# Patient Record
Sex: Male | Born: 1935
Health system: Southern US, Community
[De-identification: ages and names within clinical notes are randomized; demographics above are authoritative.]

## PROBLEM LIST (undated history)

## (undated) DIAGNOSIS — Z86010 Personal history of colonic polyps: Secondary | ICD-10-CM

## (undated) DIAGNOSIS — R413 Other amnesia: Secondary | ICD-10-CM

## (undated) DIAGNOSIS — Z79899 Other long term (current) drug therapy: Secondary | ICD-10-CM

## (undated) DIAGNOSIS — E7800 Pure hypercholesterolemia, unspecified: Secondary | ICD-10-CM

## (undated) DIAGNOSIS — M19019 Primary osteoarthritis, unspecified shoulder: Secondary | ICD-10-CM

## (undated) DIAGNOSIS — N183 Chronic kidney disease, stage 3 unspecified: Secondary | ICD-10-CM

## (undated) DIAGNOSIS — Z77090 Contact with and (suspected) exposure to asbestos: Secondary | ICD-10-CM

## (undated) DIAGNOSIS — I951 Orthostatic hypotension: Secondary | ICD-10-CM

## (undated) DIAGNOSIS — I129 Hypertensive chronic kidney disease with stage 1 through stage 4 chronic kidney disease, or unspecified chronic kidney disease: Secondary | ICD-10-CM

## (undated) DIAGNOSIS — C189 Malignant neoplasm of colon, unspecified: Secondary | ICD-10-CM

## (undated) DIAGNOSIS — I2510 Atherosclerotic heart disease of native coronary artery without angina pectoris: Secondary | ICD-10-CM

## (undated) DIAGNOSIS — K5730 Diverticulosis of large intestine without perforation or abscess without bleeding: Secondary | ICD-10-CM

## (undated) DIAGNOSIS — F419 Anxiety disorder, unspecified: Secondary | ICD-10-CM

## (undated) DIAGNOSIS — K5190 Ulcerative colitis, unspecified, without complications: Secondary | ICD-10-CM

## (undated) DIAGNOSIS — K222 Esophageal obstruction: Secondary | ICD-10-CM

## (undated) HISTORY — PX: CORONARY ARTERY BYPASS GRAFT: SHX141

## (undated) HISTORY — DX: Personal history of colonic polyps: Z86.010

## (undated) HISTORY — DX: Anxiety disorder, unspecified: F41.9

## (undated) HISTORY — DX: Primary osteoarthritis, unspecified shoulder: M19.019

## (undated) HISTORY — DX: Esophageal obstruction: K22.2

## (undated) HISTORY — PX: OTHER SURGICAL HISTORY: SHX169

## (undated) HISTORY — DX: Chronic kidney disease, stage 3 (moderate): N18.3

## (undated) HISTORY — DX: Hypertensive chronic kidney disease with stage 1 through stage 4 chronic kidney disease, or unspecified chronic kidney disease: I12.9

## (undated) HISTORY — DX: Chronic kidney disease, stage 3 unspecified: N18.30

## (undated) HISTORY — DX: Pure hypercholesterolemia, unspecified: E78.00

## (undated) HISTORY — DX: Atherosclerotic heart disease of native coronary artery without angina pectoris: I25.10

## (undated) HISTORY — DX: Contact with and (suspected) exposure to asbestos: Z77.090

## (undated) HISTORY — DX: Other amnesia: R41.3

## (undated) HISTORY — DX: Ulcerative colitis, unspecified, without complications: K51.90

## (undated) HISTORY — DX: Orthostatic hypotension: I95.1

## (undated) HISTORY — DX: Diverticulosis of large intestine without perforation or abscess without bleeding: K57.30

## (undated) HISTORY — DX: Other long term (current) drug therapy: Z79.899

---

## 1989-01-06 DIAGNOSIS — Z860100 Personal history of colon polyps, unspecified: Secondary | ICD-10-CM

## 1989-01-06 HISTORY — DX: Personal history of colon polyps, unspecified: Z86.0100

## 1989-01-06 HISTORY — PX: HEMICOLECTOMY: SHX854

## 1989-01-06 HISTORY — DX: Personal history of colonic polyps: Z86.010

## 1992-01-07 DIAGNOSIS — C189 Malignant neoplasm of colon, unspecified: Secondary | ICD-10-CM

## 1992-01-07 HISTORY — DX: Malignant neoplasm of colon, unspecified: C18.9

## 1994-01-06 HISTORY — PX: COLOSTOMY TAKEDOWN: SHX5258

## 1997-06-13 ENCOUNTER — Ambulatory Visit (HOSPITAL_COMMUNITY): Admission: RE | Admit: 1997-06-13 | Discharge: 1997-06-13 | Payer: Self-pay | Admitting: *Deleted

## 1997-07-05 ENCOUNTER — Ambulatory Visit (HOSPITAL_COMMUNITY): Admission: RE | Admit: 1997-07-05 | Discharge: 1997-07-05 | Payer: Self-pay | Admitting: *Deleted

## 1998-01-23 ENCOUNTER — Ambulatory Visit: Admission: RE | Admit: 1998-01-23 | Discharge: 1998-01-23 | Payer: Self-pay | Admitting: Endocrinology

## 1998-06-15 ENCOUNTER — Other Ambulatory Visit: Admission: RE | Admit: 1998-06-15 | Discharge: 1998-06-15 | Payer: Self-pay | Admitting: Gastroenterology

## 1998-07-12 ENCOUNTER — Inpatient Hospital Stay (HOSPITAL_COMMUNITY): Admission: EM | Admit: 1998-07-12 | Discharge: 1998-07-17 | Payer: Self-pay | Admitting: Gastroenterology

## 1998-07-12 ENCOUNTER — Encounter: Payer: Self-pay | Admitting: Gastroenterology

## 1998-07-13 ENCOUNTER — Encounter: Payer: Self-pay | Admitting: Gastroenterology

## 2000-09-30 ENCOUNTER — Encounter (INDEPENDENT_AMBULATORY_CARE_PROVIDER_SITE_OTHER): Payer: Self-pay | Admitting: *Deleted

## 2000-09-30 ENCOUNTER — Ambulatory Visit (HOSPITAL_BASED_OUTPATIENT_CLINIC_OR_DEPARTMENT_OTHER): Admission: RE | Admit: 2000-09-30 | Discharge: 2000-09-30 | Payer: Self-pay | Admitting: Plastic Surgery

## 2002-05-17 ENCOUNTER — Encounter: Admission: RE | Admit: 2002-05-17 | Discharge: 2002-05-17 | Payer: Self-pay | Admitting: Geriatric Medicine

## 2002-05-17 ENCOUNTER — Encounter: Payer: Self-pay | Admitting: Geriatric Medicine

## 2003-04-24 ENCOUNTER — Inpatient Hospital Stay (HOSPITAL_COMMUNITY): Admission: EM | Admit: 2003-04-24 | Discharge: 2003-04-25 | Payer: Self-pay | Admitting: *Deleted

## 2003-05-11 ENCOUNTER — Encounter: Admission: RE | Admit: 2003-05-11 | Discharge: 2003-05-11 | Payer: Self-pay | Admitting: Geriatric Medicine

## 2003-05-14 ENCOUNTER — Encounter: Admission: RE | Admit: 2003-05-14 | Discharge: 2003-05-14 | Payer: Self-pay | Admitting: Geriatric Medicine

## 2003-06-01 ENCOUNTER — Emergency Department (HOSPITAL_COMMUNITY): Admission: EM | Admit: 2003-06-01 | Discharge: 2003-06-02 | Payer: Self-pay | Admitting: Emergency Medicine

## 2003-06-14 ENCOUNTER — Inpatient Hospital Stay (HOSPITAL_COMMUNITY): Admission: EM | Admit: 2003-06-14 | Discharge: 2003-06-15 | Payer: Self-pay | Admitting: Emergency Medicine

## 2003-12-04 ENCOUNTER — Ambulatory Visit (HOSPITAL_COMMUNITY): Admission: RE | Admit: 2003-12-04 | Discharge: 2003-12-04 | Payer: Self-pay | Admitting: Geriatric Medicine

## 2003-12-13 ENCOUNTER — Encounter: Admission: RE | Admit: 2003-12-13 | Discharge: 2003-12-13 | Payer: Self-pay | Admitting: Geriatric Medicine

## 2004-01-18 ENCOUNTER — Ambulatory Visit: Payer: Self-pay | Admitting: Internal Medicine

## 2004-01-19 ENCOUNTER — Ambulatory Visit: Payer: Self-pay | Admitting: Gastroenterology

## 2004-01-29 ENCOUNTER — Ambulatory Visit (HOSPITAL_COMMUNITY): Admission: RE | Admit: 2004-01-29 | Discharge: 2004-01-29 | Payer: Self-pay | Admitting: Gastroenterology

## 2004-03-19 ENCOUNTER — Ambulatory Visit (HOSPITAL_COMMUNITY): Admission: RE | Admit: 2004-03-19 | Discharge: 2004-03-19 | Payer: Self-pay | Admitting: Gastroenterology

## 2004-03-19 ENCOUNTER — Ambulatory Visit: Payer: Self-pay | Admitting: Gastroenterology

## 2004-12-20 ENCOUNTER — Emergency Department: Payer: Self-pay | Admitting: Emergency Medicine

## 2006-11-18 ENCOUNTER — Ambulatory Visit: Payer: Self-pay | Admitting: Internal Medicine

## 2006-11-18 LAB — CONVERTED CEMR LAB
ALT: 29 units/L (ref 0–53)
Alkaline Phosphatase: 76 units/L (ref 39–117)
BUN: 20 mg/dL (ref 6–23)
Basophils Absolute: 0.1 10*3/uL (ref 0.0–0.1)
Basophils Relative: 0.9 % (ref 0.0–1.0)
Chloride: 104 meq/L (ref 96–112)
Eosinophils Absolute: 0.5 10*3/uL (ref 0.0–0.6)
GFR calc non Af Amer: 51 mL/min
HCT: 43.9 % (ref 39.0–52.0)
MCV: 90 fL (ref 78.0–100.0)
Monocytes Absolute: 0.5 10*3/uL (ref 0.2–0.7)
RDW: 13.5 % (ref 11.5–14.6)
Sodium: 141 meq/L (ref 135–145)
TSH: 1.37 microintl units/mL (ref 0.35–5.50)

## 2006-11-27 ENCOUNTER — Ambulatory Visit: Payer: Self-pay | Admitting: Gastroenterology

## 2006-12-02 ENCOUNTER — Encounter: Payer: Self-pay | Admitting: Gastroenterology

## 2006-12-02 ENCOUNTER — Ambulatory Visit: Payer: Self-pay | Admitting: Gastroenterology

## 2007-01-09 ENCOUNTER — Emergency Department: Payer: Medicare Other | Admitting: Emergency Medicine

## 2007-01-29 DIAGNOSIS — D696 Thrombocytopenia, unspecified: Secondary | ICD-10-CM

## 2007-01-29 DIAGNOSIS — I10 Essential (primary) hypertension: Secondary | ICD-10-CM | POA: Insufficient documentation

## 2007-01-29 DIAGNOSIS — I2510 Atherosclerotic heart disease of native coronary artery without angina pectoris: Secondary | ICD-10-CM | POA: Insufficient documentation

## 2007-01-29 DIAGNOSIS — C679 Malignant neoplasm of bladder, unspecified: Secondary | ICD-10-CM | POA: Insufficient documentation

## 2007-01-29 DIAGNOSIS — K5190 Ulcerative colitis, unspecified, without complications: Secondary | ICD-10-CM | POA: Insufficient documentation

## 2007-01-29 DIAGNOSIS — K5730 Diverticulosis of large intestine without perforation or abscess without bleeding: Secondary | ICD-10-CM | POA: Insufficient documentation

## 2007-01-29 DIAGNOSIS — C61 Malignant neoplasm of prostate: Secondary | ICD-10-CM

## 2007-01-29 DIAGNOSIS — C189 Malignant neoplasm of colon, unspecified: Secondary | ICD-10-CM | POA: Insufficient documentation

## 2007-01-29 DIAGNOSIS — Z8701 Personal history of pneumonia (recurrent): Secondary | ICD-10-CM | POA: Insufficient documentation

## 2007-01-29 DIAGNOSIS — E785 Hyperlipidemia, unspecified: Secondary | ICD-10-CM

## 2007-07-31 ENCOUNTER — Inpatient Hospital Stay: Payer: Medicare Other | Admitting: *Deleted

## 2007-10-18 ENCOUNTER — Encounter: Admission: RE | Admit: 2007-10-18 | Discharge: 2007-10-18 | Payer: Self-pay | Admitting: Geriatric Medicine

## 2007-11-28 ENCOUNTER — Emergency Department: Payer: Medicare Other | Admitting: Emergency Medicine

## 2009-04-20 ENCOUNTER — Emergency Department: Payer: Medicare Other | Admitting: Emergency Medicine

## 2009-05-18 ENCOUNTER — Emergency Department: Payer: Medicare Other

## 2009-05-22 ENCOUNTER — Emergency Department: Payer: Medicare Other | Admitting: Internal Medicine

## 2009-06-01 ENCOUNTER — Encounter: Admission: RE | Admit: 2009-06-01 | Discharge: 2009-06-01 | Payer: Self-pay | Admitting: Geriatric Medicine

## 2010-05-20 ENCOUNTER — Other Ambulatory Visit: Payer: Self-pay | Admitting: *Deleted

## 2010-05-20 NOTE — Telephone Encounter (Signed)
Not my patient that I am aware of

## 2010-05-20 NOTE — Telephone Encounter (Signed)
Faxed request for [facility] is on your desk.  This is not on med list, pt has not been seen in office.

## 2010-05-20 NOTE — Telephone Encounter (Signed)
Faxed back to [facility], not our patient

## 2010-05-21 NOTE — Assessment & Plan Note (Signed)
[facility]                         GASTROENTEROLOGY OFFICE NOTE   Byrd, Dustin                       MRN:          [id number]  DATE:11/18/2006                            DOB:          [birth year]    PROBLEM:  Constipation.   HISTORY:  Mr. Wolf is a delightful 77 year old white male known to Dr.  Jarold Motto who was last seen here in 2006.  The patient does have a  history of a previous right hemicolectomy done for a large tubovillous  adenoma.  He also has history of bladder cancer with resection in 1994,  coronary artery disease status post CABG, and history of ulcerative  colitis.  His last colonoscopy was done in February 2005.  He was noted  to have active left-sided ulcerative colitis at that time.  He has been  maintained on Mesalamine and variably takes 4-8 tablets of Pentasa  daily.  His colitis has really been in remission for the past couple of  years from a symptom standpoint.  He has had some problems in the past  with mild constipation but says that he has developed fairly severe  constipation over the past several months.  He had been taking milk-of-  magnesia, a large dose about once a week and says that initially that  was helping but that he would only have a bowel movement once a week.  Now that does not seem to be working, and he is feeling fairly  miserable.  His appetite has been good.  He has actually gained some  weight.  He has had some complaints of very low back discomfort.  He has  not noted any melena or hematochezia.   CURRENT MEDICATIONS:  1. Folic acid 1 mg p.o. daily.  2. Alprazolam 0.5 q.i.d.  3. Pentasa 250 mg 4 p.o. b.i.d.  4. Simvastatin 40 mg daily.  5. Celexa 10 mg daily.  6. Claritin 10 mg daily.  7. Allopurinol 100 daily.  8. Aspirin 325 daily.   ALLERGIES:  No known drug allergies.   PAST HISTORY:  As outlined above.   SOCIAL HISTORY:  The patient is widowed.  He lives at [facility] in the  independent living facility and has a supportive family.  He is  ambulatory.   EXAM:  A well-developed elderly white male in good spirits.  Weight is 206.6, blood pressure 122/60, pulse in the 80s.  HEENT:  Nontraumatic, normocephalic, EOMI, PERRLA, sclerae anicteric.  CARDIOVASCULAR:  Regular rate and rhythm with S1 and S2.  No significant  murmur, rub, or gallop.  Sternotomy scar.  PULMONARY:  Clear.  ABDOMEN:  Soft.  Bowel sounds are active.  He is basically nontender.  There is no palpable mass or hepatosplenomegaly.  RECTAL:  Trace heme-positive stool.  There is no significant stool in  the rectal vault.   IMPRESSION:  1. An 77 year old male with progressive constipation for 1 year, rule      out functional constipation, rule out anastomotic stricture, rule      out occult colon lesion in a patient with previous large  tubovillous adenoma status post right hemicolectomy.  2. History of ulcerative colitis, maintained on mesalamine, relatively      asymptomatic.   PLAN:  1. Check CBC, CMET, and TSH today.  2. Start trial of Miralax 17 g daily in 8 ounces of water.  3. The patient has a followup appointment with Dr. Jarold Motto in      approximately 2 weeks.  We will leave that scheduled, and they will      discuss followup colonoscopy at that time.      Mike Gip, PA-C  Electronically Signed      Wilhemina Bonito. Marina Goodell, MD  Electronically Signed   AE/MedQ  DD: 11/18/2006  DT: 11/19/2006  Job #: [id number]   cc:   Vania Rea. Jarold Motto, MD, Caleen Essex, FAGA

## 2010-05-24 NOTE — H&P (Signed)
NAME:  Dustin Byrd, Dustin Byrd                          ACCOUNT NO.:  [id number]   MEDICAL RECORD NO.:  [id number]                   PATIENT TYPE:  EMS   LOCATION:  ED                                   FACILITY:  [facility]   PHYSICIAN:  Deirdre Peer. Polite, M.D.              DATE OF BIRTH:  April 27, 1918   DATE OF ADMISSION:  06/14/2003  DATE OF DISCHARGE:                                HISTORY & PHYSICAL   CHIEF COMPLAINT:  Shaking chills.   HISTORY OF PRESENT ILLNESS:  Mr. Keltner is an 77 year old male with multiple  medical problems, which include ulcerative colitis, coronary artery disease  status post CABG, high cholesterol, history of asbestosis, mild dementia,  and recent partially-treated bronchitis. The patient presents to the ED for  evaluation after having chills this morning after eating breakfast.  The  patient stated the chills were so bad that he had to put on layers of  clothes and turn up his heat.  He does admit to having occasional cough, in  fact was seen at [facility] ED about one to two weeks ago and diagnosed  with upper respiratory infection/bronchitis, treated with Biaxin; however,  he states that he was intolerant to that medicine secondary to nausea and  the size of the pill.  The patient states he also was given prednisone,  which he took.  The patient has seen his primary M.D. less than a week ago,  at that time without problems.  However, today, as stated, the patient woke  up with shaking chills and in the ED had studies, x-ray without active  infiltrate but did show old granulomatous disease and COPD.  CBC showing an  elevated white count.  In addition, the patient states that he has not had  any fever prior to this but does note some increase in urine frequency  lately, however denies any chest pain, any shortness of breath, denies any  abdominal pain, denies any diarrhea, constipation, denies any blood in the  stool.  The patient states that he has been compliant with  his other  medications.  Because of the patient's febrile illness, admission is deemed  necessary for further evaluation and treatment.   PAST MEDICAL HISTORY:  As stated above.  Significant for ulcerative colitis.  The patient is status post colectomy.  Coronary artery disease, status post  four-vessel CABG.  Elevated cholesterol.  History of asbestosis.  Mild  dementia.  The patient states he had bladder cancer, is now status post  surgical resection.  Old records suggest the patient has a history of  esophageal stricture, history of colonic polyps, DJD of the left shoulder.   SOCIAL HISTORY:  Negative for tobacco x30 years.  Prior to that the patient  states he smoked approximately two packs a day for greater than 30 years.  The patient drinks alcohol daily, two drinks a day.  Denies any drugs.  PAST SURGICAL HISTORY:  Significant for colectomy, CABG.  The patient denies  appendectomy or cholecystectomy.  The patient has had bilateral cataract  extractions.   ALLERGIES:  The patient denies any drug allergies.   MEDICATION LIST:  1. Metoprolol 50 mg half-tablet two times a day.  2. Folic acid 1 mg one tablet daily.  3. Xanax 0.5 mg one tablet four times a day.  4. Pentasa 250 mg four capsules two times a day.  5. Aricept 10 mg half-tablet a day.  6. Zocor 40 mg one tablet a day.  7. Lorazepam 0.5 mg one to two tablets a day.  8. Indomethacin 25 mg one capsule three times a day as needed.  9. Piroxicam 10 mg capsule one capsule two times a day.  10.      Nitroglycerin 0.4 mg p.r.n.  11.      Combivent two puffs two times a day.  12.      Aspirin 81 mg daily.   REVIEW OF SYSTEMS:  As stated in the HPI.   FAMILY HISTORY:  The patient states his mother is deceased from bone cancer  and his father deceased from complications of diabetes.   PHYSICAL EXAMINATION:  GENERAL:  The patient was alert and oriented x3, in  no apparent distress.  HEENT:  Anicteric sclerae.  No oral  lesions.  No nodes.  No JVD.  CHEST:  Bibasilar crackles.  No expiratory wheeze.  CARDIOVASCULAR:  Regular S1, S2, no S3.  ABDOMEN:  Soft, nontender, no hepatosplenomegaly.  EXTREMITIES:  No clubbing, cyanosis, or edema.  2+ pulse.  NEUROLOGIC:  Essentially nonfocal.   LABORATORY DATA:  Chest x-ray shows cardiomegaly with vascular congestion,  old granulomatous disease, and COPD.  UA:  Specific gravity 1.017, LE and  nitrite-negative.  CBC:  White count 12.8, hemoglobin 14.4, hematocrit 42.3,  platelets 99.  Neutrophil count 94%.  BMET:  Sodium 136, potassium 4.6,  chloride 102, carbon dioxide 30, BUN 18, creatinine 1.1.  AST and ALT 24 and  25, respectively, bilirubin 1.1.   ASSESSMENT:  1. Fever.  2. Upper respiratory infection last week, partially treated.  3. History of asbestosis.  Of note, the patient's chest x-ray shows chronic     obstructive pulmonary disease and old granulomatous disease.  4. History of seasonal allergies.  5. Status post coronary artery bypass graft.  6. High cholesterol.  7. Ulcerative colitis, which appears to be quiescent.   Recommend the patient be admitted to a medicine floor bed, be started on O2,  frequent nebulizers, and treat this as a partially-treated bronchitis.  Will  panculture the patient as he is febrile.  Hopefully, without any  complications the patient may be able to be discharged in 24-48 hours.  Would continue the patient's current outpatient medications.                                               Deirdre Peer. Polite, M.D.    RDP/MEDQ  D:  06/14/2003  T:  06/14/2003  Job:  [id number]   cc:   Hal T. Stoneking, M.D.  [address] [place], [place] [address]  Fax: [phone]

## 2010-05-24 NOTE — Discharge Summary (Signed)
NAME:  Dustin Byrd, Dustin Byrd                          ACCOUNT NO.:  [id number]   MEDICAL RECORD NO.:  [id number]                   PATIENT TYPE:  INP   LOCATION:  2022                                 FACILITY:  [facility]   PHYSICIAN:  Veneda Melter, M.D.                   DATE OF BIRTH:  1918-12-10   DATE OF ADMISSION:  04/24/2003  DATE OF DISCHARGE:  04/25/2003                                 DISCHARGE SUMMARY   PROCEDURES:  CT of the chest.   HOSPITAL COURSE:  Mr. Dustin Byrd is an 77 year old male with a history of  coronary artery disease.  He had bypass surgery back in 1996 by Dr.  Tyrone Sage.  He has not had a heart catheterization since then.  At the time  of his bypass, he had a solid MI.  In follow up with Dr. Daleen Squibb, he had a  Cardiolite in 2002 that showed mild apical ischemia with a normal left  ventricle.  He did not want a cath at that time.  The medical therapy was an  acceptable option for him.  He has done well since then.   Mr. Dustin Byrd has approximately a two month history of left shoulder pain that  has been intermittent.  He is able to relieve the pain by getting in certain  positions.  He gave up golf secondary to these symptoms.  At 3:00 a.m. on  the day of admission, he awoke with chest pain.  He sat in the recliner and  got some relief, but was still complaining of pain this a.m.  He did not try  any medications.  He spoke with his daughter and called the office and was  told to come to the emergency room.  He describes the pain as a 5/10 and has  some chest wall tenderness.  The pain increases with deep inspiration.  He  has never had these symptoms before and does not remember any anginal  symptoms at all.  He denies shortness of breath, nausea, vomiting or  diaphoresis.  He also has a two to three week history of back pain.  He  denies any recent exertional symptoms.  He was admitted to rule out MI and  for further evaluation.   He had Toradol added to his medication regimen  and his symptoms were  relieved.  His enzymes were negative for MI.  His other home medications  were continued.  Because of his chest wall tenderness, CT of the chest was  performed, which showed mild emphysema and some calcified lymph nodes as  well as a 1.3 cm granuloma with calcification in the left upper lobe.  There  were no worrisome lesions.  The CT was negative for PE.  There was no acute  disease.  CT through his lower extremities was negative for DVT.   On 04/25/03, Mr. Dustin Byrd was evaluated by Dr. Glennon Hamilton.  Because  his  symptoms had resolved and he was ambulating without chest pain or shortness  of breath, he was considered stable for discharge with outpatient follow up  and stress test arranged.   Mr. Dustin Byrd had a platelet count of 92,000 on his initial labs.  Dr.  Laverle Hobby office was contacted and it was found that he had a platelet of  111,000 in August of 2004.  His medications were evaluated and it was found  that he was taking Indocin on a p.r.n. basis for gout symptoms.  Mr. Dustin Byrd  was requested to hold the Indocin for now and follow up with Dr. Pete Glatter  to make sure that this was an acceptable therapy.   LABORATORY VALUES:  Hemoglobin 16.1, hematocrit 48.0, WBC 7.9, platelets 92.  INR 1.4, PTT 33.  Sodium 140, potassium 4.6, chloride 106, CO2 of 29.  BUN  20, creatinine 1.2, glucose 127.   DISCHARGE DIAGNOSES:  1. Chest pain, possibly musculoskeletal, enzymes negative for myocardial     infarction.  Follow up outpatient Cardiolite and treat with Tylenol.  2. Status post aortic coronary bypass surgery in 1996.  3. Preserved left ventricular function with an ejection fraction of 60% by     Cardiolite in 2002.  4. Status post Cardiolite 2002 that showed mild apical ischemia.  5. Hyperlipidemia.  6. History of asbestosis.  7. History of gout.  8. History of osteoarthritis.  9. Ulcerative colitis.  10.      Thrombocytopenia with a platelet count of 92,000  this admission.  11.      Status post squamous cell carcinoma removal.  12.      History of silent myocardial infarction prior to bypass surgery.  13.      Reported intolerance to colchicine.  14.      Family history of coronary artery disease in his siblings.  15.      Chronic dyspnea on exertion.  16.      Recent fatigue and decreased exercise intolerance.  17.      Mild hyperglycemia, eval per primary medical doctor.   DISCHARGE INSTRUCTIONS:  1. His activity level is to be as tolerated.  2. He is to stick to a low fat diet and have only one drink per night.  3. He is not to eat or drink anything after midnight tonight except meds     with a few sips of water.  4. He has a stress test scheduled on 04/26/03 at 10:00 a.m.  5. He is to follow up with Dr. Daleen Squibb on April 27 at 2:15 p.m.  6. He is to call Dr. Pete Glatter for an appointment.  7. He is not to have any caffeine or decaf coffee or tea until after the     stress test.   DISCHARGE MEDICATIONS:  1. Metoprolol 50 mg one half tab b.i.d.  2. Folic acid 1 mg daily.  3. Alprazolam 0.5 mg q.i.d.  4. Mesalamine 250 mg,  1 gram b.i.d.  5. Aricept 10 mg one half daily.  6. Zocor 40 mg daily.  7. Lorazepam 5 mg q.h.s.  8. Indomethacin is on hold.  9. Piroxicam 10 mg b.i.d.  10.      Albuterol MDI two puffs b.i.d.  11.      Tylenol extra strength t.i.d. for one week.      Theodore Demark, P.A. [facility]                  Veneda Melter, M.D.  RB/MEDQ  D:  04/25/2003  T:  04/26/2003  Job:  [id number]   cc:   Hal T. Stoneking, M.D.  [address]  [place], [place] [address]  Fax: [phone]   Jesse Sans. Wall, M.D.

## 2010-05-24 NOTE — H&P (Signed)
NAME:  [place], GROSSER                          ACCOUNT NO.:  [id number]   MEDICAL RECORD NO.:  [id number]                   PATIENT TYPE:  INP   LOCATION:  1825                                 FACILITY:  [facility]   PHYSICIAN:  Veneda Melter, M.D.                   DATE OF BIRTH:  1918/03/07   DATE OF ADMISSION:  04/24/2003  DATE OF DISCHARGE:                                HISTORY & PHYSICAL   PRIMARY CARE PHYSICIAN:  Dr. Ann Maki T. Stoneking.   PRIMARY CARDIOLOGIST:  Dr. Jesse Sans. Wall.   CHIEF COMPLAINT:  Chest pain.   HISTORY OF PRESENT ILLNESS:  Mr. Poteete is an 77 year old male with a  history of coronary artery disease.  He had bypass surgery in 1996 by  Dr.  Ramon Dredge B. Gerhardt.  At that time, he had a silent MI and abnormal stress  test.  He has had left shoulder pain for 2 months, significant enough to  cause him to quit playing golf.  It is better with him in certain positions.  At approximately 3 a.m. on the day of admission, he awoke with left chest  pain.  He sat in the recliner with some relief, but still complained of pain  this morning and he called the office and was told to come to the emergency  room.  He says it was a 5/10 and increased with deep inspiration.  He had  never had this before, although he does not remember any anginal symptoms  prior to his bypass surgery.  He denies shortness of breath, nausea,  vomiting or diaphoresis with this.  He also has a 2- to 3-week history of  back pain below his left scapula.   PAST MEDICAL HISTORY:  1. Past medical history is significant for a Cardiolite in 2002 that showed     mild apical ischemia and normal left ventricular function with an EF of     60%.  He had bypass surgery in 1996 by Dr. Ramon Dredge B. Gerhardt.  2.  He     has a history of hyperlipidemia.  2. He has a history of asbestosis.  3. He has a history of gout and osteoarthritis.  4. He has a history of ulcerative colitis.  5. He has a history of thrombocytopenia  with a platelet count of 111,000 in     August 2004.   SURGICAL HISTORY:  Surgical history is significant for cardiac  catheterization, bypass surgery and squamous cell removal.   ALLERGIES:  COLCHICINE.   MEDICATIONS:  1. Metoprolol 50 mg 1/2 tab b.i.d.  2. Folic acid 1 mg daily.  3. Alprazolam 0.5 mg 4 times daily.  4. Methylamine 250 mg 4 tabs b.i.d.  5. Aricept 10 mg 1/2 tab daily.  6. Zocor 40 mg daily.  7. Lorazepam 5 mg 1-2 q.p.m.  8. Indomethacin 25 mg t.i.d. p.r.n.  9. Piroxicam 10 mg  b.i.d.  10.      Albuterol MDI 18 mcg 2 puffs b.i.d.  11.      Nitroglycerin p.r.n.   SOCIAL HISTORY:  He lives in [place] alone and is a widower.  His  daughter is very attentive.  He quit smoking in the 1970s.  He has 2  scotches per night.  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   FAMILY HISTORY:  His mother died at age 71 with bone cancer and no known  history of coronary artery disease.  His father died at age 25 with diabetes  and no known history of coronary artery disease.  He has 1 half brother that  died of an MI and his other siblings are all deceased, although they mainly  died with cancer.   REVIEW OF SYSTEMS:  Review of systems is significant for chronic dyspnea on  exertion with no recent change.  He does complain of some fatigue when he  walks and questionable claudication symptoms.  He has occasional dizziness  but denies syncope and actually denies presyncope.  There is no recent cough  or wheeze.  He denies any orthopnea, paroxysmal nocturnal dyspnea, edema or  palpitations.  He has occasional nocturia.  He states that he does have some  problems with anxiety.  He has arthralgias.  He denies any current GI  symptoms and the review of systems is otherwise negative.   PHYSICAL EXAM:  VITAL SIGNS:  Temperature is 97.4, blood pressure 133/60,  heart rate 67, respiratory rate 20, O2 saturation 96% on room air.  GENERAL:  He is a well-developed, elderly white male  in no acute distress.  HEENT:  His head is normocephalic and atraumatic with pupils equal, round  and reactive to light and extraocular movements are intact, sclerae are  clear, nares without discharge.  NECK:  Is supple and without lymphadenopathy, thyromegaly, bruit or JVD  noted.  CV:  His heart is regular in rate and rhythm with an S1, S2 and S4, but no  significant murmur or rub.  His distal pulses are 2+ and no femoral bruits  are appreciated.  LUNGS:  He has a few rales at the bases and a tender area in the upper left  part of his chest.  SKIN:  No rashes or lesions are noted.  ABDOMEN:  Is firm but nontender and active bowel sounds present.  EXTREMITIES:  He has no cyanosis, clubbing or edema.  Distal pulses are  intact to all 4 extremities.  MUSCULOSKELETAL:  He has a slight tender area below the left scapula but no  joint deformity or effusions and no true CVA tenderness is noted.  NEUROLOGIC:  He is alert and oriented with cranial nerves II-XII grossly  intact and normal strength for his age.   LABORATORY AND ACCESSORY CLINICAL DATA:  Chest x-rays:  Cardiomegaly with  bibasilar atelectasis/scar but no edema or infiltrate.   EKG:  Rate 61, sinus rhythm with left atrial enlargement and no acute  ischemic changes and no old for comparison.   Laboratory values:  CMET and CK-MB and troponin are pending.  Hemoglobin  16.1, hematocrit 48, WBC 7.9, platelets 92,000.  INR 1.4 with PTT of 33.   ASSESSMENT AND PLAN:  1. Mr. Travelstead is an 77 year old male with atypical shoulder pain.  Because     he has a history of an abnormal  Cardiolite as well as a silent     myocardial infarction, he will be admitted  and myocardial infarction will     be ruled out with enzymes.  Pain-control medications will be used.  If     his enzymes are negative, he will follow up in the office with an     outpatient echocardiogram and Cardiolite. 2. Left shoulder pain.  I do not believe indomethacin was  helpful to him per     the patient report.  We will try Toradol and Ultram.  Additionally, CT     will be obtained to rule out other causes of left shoulder and chest     pain.  3. Thrombocytopenia.  Per Dr. Laverle Hobby office, his platelets were 111,000     back in August of 2004.  We will not use indomethacin at this time.  He     needs to follow up with Dr. Pete Glatter for this.  4. The patient is otherwise stable and will be continued on his home     medications.   COMMENT:  This is Theodore Demark, P.A.-C, dictating for Dr. Veneda Melter, who  saw the patient and determined the plan of care.      Theodore Demark, P.A. [facility]                  Veneda Melter, M.D.    RB/MEDQ  D:  04/24/2003  T:  04/25/2003  Job:  [id number]

## 2010-05-24 NOTE — Op Note (Signed)
[place]  Patient:    Dustin Byrd, Dustin Byrd Visit Number: [id number] MRN: [id number]          Service Type: Attending:  Teena Irani. Odis Luster, M.D. Dictated by:   Teena Irani. Odis Luster, M.D. Proc. 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  POSTOPERATIVE DIAGNOSES: 1. 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 2. Complex open wound - left ear resulting from a carcinoma excision greater    than 1.0 cm.  PROCEDURES PERFORMED: 1. Excision of squamous cell carcinoma - left ear helical rim, 0.5 cm. 2. Complex wound closure - left ear helical rim, greater than 1.0 cm.  SURGEON:  Teena Irani. Odis Luster, M.D.  ANESTHESIA:  1% Xylocaine with epinephrine plus bicarbonate.  CLINICAL NOTE:  An 77 year old man referred by his dermatologist for biopsy-proven squamous cell carcinoma where a wider excision was recommended. The nature of the procedure and the risks were discussed with him, including the possibility of further surgery depending on the final pathology report. He wished to proceed.  DESCRIPTION OF PROCEDURE:  The patient was taken to the operating room and placed in the right lateral decubitus position.  Under loupe magnification, the elliptical excision of this squamous cell carcinoma was marked in an oblique fashion in an effort to avoid distortion of the helical rim.  The area was then prepped with Betadine and draped with sterile drapes.  Satisfactory local anesthesia was achieved and the elliptical excision was performed.  The specimen was removed.  The wound was irrigated thoroughly.  The wound edges were advanced in such a way as to try to avoid distortion of the helical rim of the ear using 5-0 Prolene simple interrupted sutures.  Again, the nature of the excision, orientation of the wound was all planned in an effort to  avoid disfigurement of the helical rim.  Antibiotic ointment and a dry sterile dressing were applied and he tolerated the procedure well.  DISPOSITION:  Activities ad lib.  He may remove the Bandaid and shower tomorrow, but I would reapply the Bandaid for several days.  He will continue to reapply the Bandaid each day for several days.  It is okay to resume his aspirin in three days.  He will call for an appointment for Tuesday, October 8th. Dictated by:   Teena Irani. Odis Luster, M.D. Attending:  Teena Irani. Odis Luster, M.D. DD:  09/30/00 TD:  09/30/00 Job: [id number]

## 2010-05-24 NOTE — Op Note (Signed)
NAMEARVIN, ABELLO                ACCOUNT NO.:  [id number]   MEDICAL RECORD NO.:  [id number]          PATIENT TYPE:  AMB   LOCATION:  ENDO                         FACILITY:  [facility]   PHYSICIAN:  Vania Rea. Jarold Motto, M.D. [facility] OF BIRTH:  1918/09/17   DATE OF PROCEDURE:  03/19/2004  DATE OF DISCHARGE:  03/19/2004                                 OPERATIVE REPORT   Esophageal manometry was competed on March 19, 2004.  The results are as  follows:   1.  Upper sphincter pressures recorded electronically appeared normal.   1.  Lower esophageal measure pressure approximately 20 mmHg with normal      relaxation and swallowing.   1.  Motility pattern:  There were normally propagated peristaltic waves      throughout the length of the esophagus with a mean amplitude of      contractions in the distal esophageal 123 mmHg.  There are, however,      occasional very strong amplitude waves with double peaks.  However,      there are no spontaneous, repetitive or prolonged contractions      consistent with esophageal spasm.   ASSESSMENT:  This is probably a normal esophageal manometry and certainly is  not consistent with achalasia.   RECOMMENDATIONS:  We will follow this patient in the office and treat him  accordingly.  He may have an nonspecific esophageal motility disorder.      DRP/MEDQ  D:  03/25/2004  T:  03/25/2004  Job:  [id number]

## 2011-02-10 DIAGNOSIS — R413 Other amnesia: Secondary | ICD-10-CM | POA: Diagnosis not present

## 2011-02-10 DIAGNOSIS — I129 Hypertensive chronic kidney disease with stage 1 through stage 4 chronic kidney disease, or unspecified chronic kidney disease: Secondary | ICD-10-CM | POA: Diagnosis not present

## 2011-02-10 DIAGNOSIS — Z79899 Other long term (current) drug therapy: Secondary | ICD-10-CM | POA: Diagnosis not present

## 2011-02-10 DIAGNOSIS — E7800 Pure hypercholesterolemia, unspecified: Secondary | ICD-10-CM | POA: Diagnosis not present

## 2011-04-01 DIAGNOSIS — I10 Essential (primary) hypertension: Secondary | ICD-10-CM | POA: Diagnosis not present

## 2011-04-01 DIAGNOSIS — L251 Unspecified contact dermatitis due to drugs in contact with skin: Secondary | ICD-10-CM | POA: Diagnosis not present

## 2011-04-01 DIAGNOSIS — R609 Edema, unspecified: Secondary | ICD-10-CM | POA: Diagnosis not present

## 2011-04-01 DIAGNOSIS — M7989 Other specified soft tissue disorders: Secondary | ICD-10-CM | POA: Diagnosis not present

## 2011-04-01 DIAGNOSIS — IMO0002 Reserved for concepts with insufficient information to code with codable children: Secondary | ICD-10-CM | POA: Diagnosis not present

## 2011-04-01 DIAGNOSIS — M79609 Pain in unspecified limb: Secondary | ICD-10-CM | POA: Diagnosis not present

## 2011-04-08 ENCOUNTER — Encounter: Payer: Self-pay | Admitting: Gastroenterology

## 2011-04-08 ENCOUNTER — Ambulatory Visit (INDEPENDENT_AMBULATORY_CARE_PROVIDER_SITE_OTHER): Payer: Medicare Other | Admitting: Gastroenterology

## 2011-04-08 VITALS — BP 114/68 | HR 80 | Ht 70.0 in | Wt 163.8 lb

## 2011-04-08 DIAGNOSIS — K5900 Constipation, unspecified: Secondary | ICD-10-CM

## 2011-04-08 DIAGNOSIS — K529 Noninfective gastroenteritis and colitis, unspecified: Secondary | ICD-10-CM

## 2011-04-08 DIAGNOSIS — Z85038 Personal history of other malignant neoplasm of large intestine: Secondary | ICD-10-CM | POA: Diagnosis not present

## 2011-04-08 DIAGNOSIS — K5289 Other specified noninfective gastroenteritis and colitis: Secondary | ICD-10-CM | POA: Diagnosis not present

## 2011-04-08 DIAGNOSIS — F0390 Unspecified dementia without behavioral disturbance: Secondary | ICD-10-CM

## 2011-04-08 DIAGNOSIS — K5909 Other constipation: Secondary | ICD-10-CM

## 2011-04-08 DIAGNOSIS — K624 Stenosis of anus and rectum: Secondary | ICD-10-CM | POA: Diagnosis not present

## 2011-04-08 MED ORDER — LINACLOTIDE 145 MCG PO CAPS: 1.0000 | ORAL_CAPSULE | Freq: Every day | ORAL | Status: DC

## 2011-04-08 NOTE — Progress Notes (Signed)
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 He also has a history of a nonspecific colitis managed with low-dose oral aminosialyicate. I have not seen him in 5-6 years, and he now presents with worsening constipation with what sounds like rectal outlet dysfunction. He denies abdominal pain, nausea vomiting, melena or hematochezia. He is followed closely by Dr. Pete Glatter and has apparently had normal CBC a metabolic profile. Constipation is currently being managed with a cocktail of milk of magnesia and prune juice, when necessary MiraLax, and when necessary milk of magnesia. Throughout the interview and exam the patient's youngest daughter was in attendance. Review of his medications shows that he is not onl narcotics. Patient and his daughter denies significant upper gastrointestinal symptomatology or hepatobiliary complaints. The daughter relates that Rendon eats poorly and does not take liberal by mouth fluids.  I have reviewed this patient's present history, medical and surgical past history, allergies and medications.     ROS: The remainder of the 10 point ROS is negative... mild dementia obvious, but the patient apparently is able to function well at his nursing home facility.     Physical Exam: Healthy-appearing patient in no acute distress. Blood pressure 114/68, pulse 80 and regular, and BMI 23.50. General well developed well nourished patient in no acute distress, appearing younger than his stated age Eyes PERRLA, no icterus, fundoscopic exam per opthamologist Skin no lesions noted Neck supple, no adenopathy, no thyroid enlargement, no tenderness Chest clear to percussion and auscultation Heart no significant murmurs, gallops or rubs noted Abdomen no hepatosplenomegaly masses or tenderness, BS normal. Thin abdominal wall mild distention. Bowel sounds are not  obstructive. Rectal inspection normal no fissures, or fistulae noted.  No masses or tenderness on digital exam.  There was an obvious rectal stricture to digital examination which I manually dilated as best as possible. This caused a slight amount of trauma. I could not feel rectal impaction, or any masses or tenderness in his rectum. Extremities no acute joint lesions, edema, phlebitis or evidence of cellulitis. Neurologic patient oriented x 3, cranial nerves intact, no focal neurologic deficits noted. Psychological mental status normal and normal affect. The patient has some obvious short-term memory loss and some confabulation. However, he is oriented x3 in his long-term memory seems excellent.  Assessment and plan: Chronic constipation in a 78 year old patient with hopefully improved bowel function after rectal dilation. Reviewed his record and cannot see a CBC, but apparently this has been normal. With his current situation, I doubt he is a good candidate for followup colonoscopy exam. I would suggest a trial of Linzess  145 mcg a day as tolerated with adjustment in his MiraLax and other bowel preps as per his clinical response. He will not eat high-fiber foods or take liberal by mouth fluids per his daughter's opinion. There certainly is no evidence of active inflammatory bowel disease, but at this point, I would probably continue Pentasa therapy. We will try to obtain his old records for review.  No diagnosis found.

## 2011-04-08 NOTE — Patient Instructions (Signed)
Take Linzess one tablet by mouth first thing in the morning, rx printed.

## 2011-04-10 ENCOUNTER — Telehealth: Payer: Self-pay | Admitting: Gastroenterology

## 2011-04-10 NOTE — Telephone Encounter (Signed)
I doubt this is a reaction to Linzess... he is a difficult patient to deal with and I suspect functional problems related to his dementia. I would defer to his primary care physician about further management, but again I doubt this is related to Linzess.Marland KitchenMarland Kitchen

## 2011-04-10 NOTE — Telephone Encounter (Signed)
Pt saw Dr Jarold Motto on 04/08/11 and was started on Linzess for constipation. The nurse at [facility] reports pt took his second dose this am before she went on shift. He then complained to her that he had the "worst night of my life", stating he didn't sleep and his "leg jerked up!" . Paula Compton reports pt is usually down for breakfast by 7am and she doesn't know if it's anxiety and or his dementia. She did report a soft BM this am. Please advise, 2nd dose already given, instructed nurse to hold tomorrow's dose until I call in the am. Thanks.

## 2011-04-11 ENCOUNTER — Telehealth: Payer: Self-pay | Admitting: Gastroenterology

## 2011-04-11 DIAGNOSIS — M549 Dorsalgia, unspecified: Secondary | ICD-10-CM | POA: Diagnosis not present

## 2011-04-11 DIAGNOSIS — N390 Urinary tract infection, site not specified: Secondary | ICD-10-CM | POA: Diagnosis not present

## 2011-04-11 DIAGNOSIS — I951 Orthostatic hypotension: Secondary | ICD-10-CM | POA: Diagnosis not present

## 2011-04-11 DIAGNOSIS — A499 Bacterial infection, unspecified: Secondary | ICD-10-CM | POA: Diagnosis not present

## 2011-04-11 DIAGNOSIS — D696 Thrombocytopenia, unspecified: Secondary | ICD-10-CM | POA: Diagnosis not present

## 2011-04-11 DIAGNOSIS — Z9181 History of falling: Secondary | ICD-10-CM | POA: Diagnosis not present

## 2011-04-11 DIAGNOSIS — R5381 Other malaise: Secondary | ICD-10-CM | POA: Diagnosis not present

## 2011-04-11 NOTE — Telephone Encounter (Signed)
Spoke with Angelica Chessman at [facility] who stated pt has given them " a fit " again today. He woke up and was about and stated he didn't sleep well last night; an hour later she saw him and he stated he slept great. He reports he still feels shaky and was given prn ativan and he remains fixated on constipation even though he had a small stool. They have called Dr Pete Glatter who ordered a UA. Instructed Mandy to d/c the Linzess and follow Dr Laverle Hobby orders for constipation; she stated understanding.

## 2011-04-11 NOTE — Telephone Encounter (Signed)
See next encounter.

## 2011-04-14 DIAGNOSIS — N390 Urinary tract infection, site not specified: Secondary | ICD-10-CM | POA: Diagnosis not present

## 2011-04-29 ENCOUNTER — Encounter: Payer: Self-pay | Admitting: Internal Medicine

## 2011-05-01 ENCOUNTER — Non-Acute Institutional Stay (INDEPENDENT_AMBULATORY_CARE_PROVIDER_SITE_OTHER): Payer: Medicare Other | Admitting: Internal Medicine

## 2011-05-01 ENCOUNTER — Encounter: Payer: Self-pay | Admitting: Internal Medicine

## 2011-05-01 VITALS — BP 120/60 | HR 60 | Temp 98.2°F | Resp 20 | Wt 161.0 lb

## 2011-05-01 DIAGNOSIS — I2510 Atherosclerotic heart disease of native coronary artery without angina pectoris: Secondary | ICD-10-CM

## 2011-05-01 DIAGNOSIS — K5909 Other constipation: Secondary | ICD-10-CM

## 2011-05-01 DIAGNOSIS — F0391 Unspecified dementia with behavioral disturbance: Secondary | ICD-10-CM | POA: Insufficient documentation

## 2011-05-01 DIAGNOSIS — K5900 Constipation, unspecified: Secondary | ICD-10-CM | POA: Diagnosis not present

## 2011-05-01 DIAGNOSIS — I10 Essential (primary) hypertension: Secondary | ICD-10-CM

## 2011-05-01 DIAGNOSIS — F0390 Unspecified dementia without behavioral disturbance: Secondary | ICD-10-CM | POA: Diagnosis not present

## 2011-05-01 DIAGNOSIS — M109 Gout, unspecified: Secondary | ICD-10-CM | POA: Insufficient documentation

## 2011-05-01 DIAGNOSIS — F419 Anxiety disorder, unspecified: Secondary | ICD-10-CM

## 2011-05-01 DIAGNOSIS — K5190 Ulcerative colitis, unspecified, without complications: Secondary | ICD-10-CM

## 2011-05-01 DIAGNOSIS — F39 Unspecified mood [affective] disorder: Secondary | ICD-10-CM | POA: Insufficient documentation

## 2011-05-01 NOTE — Assessment & Plan Note (Signed)
BP Readings from Last 3 Encounters:  05/01/11 120/60  04/08/11 114/68   Has been fine Will check labs for renal function Not clear why on midodrine---has some dizziness but no clear orthostasis Will stop this and monitor

## 2011-05-01 NOTE — Assessment & Plan Note (Signed)
Seems to be quiet Will continue low dose pentasa

## 2011-05-01 NOTE — Assessment & Plan Note (Signed)
Doesn't seem to be active On ASA only

## 2011-05-01 NOTE — Assessment & Plan Note (Signed)
Seems to sleep well with the lorazepam and mirtazapine He relates to chronic bowel problems

## 2011-05-01 NOTE — Assessment & Plan Note (Signed)
Quiet on meds

## 2011-05-01 NOTE — Assessment & Plan Note (Signed)
Has lots of urgency and then loose stools No masses on exam Will continue miralax and add senna-s Will make adjustments if ongoing problems

## 2011-05-01 NOTE — Progress Notes (Signed)
Subjective:    Patient ID: Dustin Byrd, male    DOB: Jul 14, 1918, 78 y.o.   MRN: [id number]  HPI Initial visit to establish care Has lived at [facility] for 7 years C building then to health care when needed more help and had falls Now in assisted living  Main concern is bowels Has urgency multiple times daily---often just passes gas Will pass liquidy stools  No blood  No abdominal pain per se On pentasa for distant colitis  History of heart disease Past CABG No recent symptoms Has nitro order but hasn't used On midodrine but no documented orthostatic hypotension  HTN history with hypertensive renal disease No meds for this now  Has noted memory issues Goes back some time Stopped driving some time ago  Current Outpatient Prescriptions on File Prior to Visit  Medication Sig Dispense Refill  . albuterol-ipratropium (COMBIVENT) 18-103 MCG/ACT inhaler Inhale 2 puffs into the lungs 3 (three) times daily.       Marland Kitchen allopurinol (ZYLOPRIM) 100 MG tablet Take 100 mg by mouth daily.      Marland Kitchen aspirin 81 MG tablet Take 81 mg by mouth daily.      . colchicine 0.6 MG tablet Take 0.6 mg by mouth daily.      Marland Kitchen LORazepam (ATIVAN) 1 MG tablet Take 1 mg by mouth at bedtime. And 0.5mg  in day as needed for anxiety      . mesalamine (PENTASA) 250 MG CR capsule Take 250 mg by mouth 4 (four) times daily.       . midodrine (PROAMATINE) 5 MG tablet Take 5 mg by mouth 3 (three) times daily.      . mirtazapine (REMERON) 7.5 MG tablet Take 7.5 mg by mouth at bedtime.      . nitroGLYCERIN (NITROSTAT) 0.4 MG SL tablet Place 0.4 mg under the tongue every 5 (five) minutes as needed.      . polyethylene glycol (MIRALAX / GLYCOLAX) packet Take 17 g by mouth daily.        Allergies  Allergen Reactions  . Aricept (Donepezil Hydrochloride)     agitation  . Cortisone   . Prednisone     Mania     Past Medical History  Diagnosis Date  . Gout   . CAD (coronary artery disease)   . DJD of shoulder   .  UC (ulcerative colitis)   . Asbestos exposure   . Hypercholesterolemia   . Esophageal stricture   . Orthostatic hypotension   . Personal history of colonic polyps 1991    villous adenoma of cecum  . Memory loss   . 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 Chronic kidney disease, stage III (moderate)   . Encounter for long-term (current) use of other medications   . Diverticulosis of colon (without mention of hemorrhage)   . Prostate cancer 1994    Past Surgical History  Procedure Date  . Hemicolectomy 1991    right  . Coronary artery bypass graft 1997    x4  . Cataract extraction w/ intraocular lens  implant, bilateral   . Colostomy takedown 1996    Family History  Problem Relation Age of Onset  . Diabetes Father   . Bipolar disorder Daughter   . Stroke Mother     History   Social History  . Marital Status: Widowed    Spouse Name: N/A    Number of Children: 2  .  Years of Education: N/A   Occupational History  . Textiles--various positions     Retired   Social History Main Topics  . Smoking status: Former Games developer  . Smokeless tobacco: Never Used  . Alcohol Use: Yes  . Drug Use: No  . Sexually Active: Not on file   Other Topics Concern  . Not on file   Social History Narrative   2 daughtersNo living willDaughter Kennon Rounds has health care POADiscussed DNR---he requests this ("I want to go in peace")No tube feedings if cognitively unaware   Review of Systems  Constitutional: Positive for unexpected weight change. Negative for fatigue.       Has lost weight slowly over time  HENT: Positive for hearing loss.        Partial lower denture  Eyes: Negative for visual disturbance.       No diplopia but has bad right eye  Respiratory: Negative for cough, chest tightness and shortness of breath.   Cardiovascular: Negative for chest pain, palpitations and leg swelling.  Gastrointestinal: Positive for  constipation. Negative for abdominal pain and blood in stool.       No heartburn  Genitourinary: Negative for urgency and difficulty urinating.  Musculoskeletal: Negative for back pain and arthralgias.  Neurological: Positive for dizziness and tremors. Negative for syncope and weakness.       Left leg occ "kicks up on me" Vague dizzy spells in past  Hematological: Negative for adenopathy. Bruises/bleeds easily.  Psychiatric/Behavioral: Positive for dysphoric mood. Negative for sleep disturbance. The patient is nervous/anxious.        Occ worries but nothing significant Depressed about bowels       Objective:   Physical Exam  Constitutional: He appears well-developed and well-nourished. No distress.  HENT:  Mouth/Throat: Oropharynx is clear and moist. No oropharyngeal exudate.  Eyes: Conjunctivae and EOM are normal. Pupils are equal, round, and reactive to light.  Neck: Normal range of motion. Neck supple. No thyromegaly present.  Cardiovascular: Normal rate, regular rhythm, normal heart sounds and intact distal pulses.  Exam reveals no gallop.   No murmur heard. Pulmonary/Chest: Effort normal and breath sounds normal. No respiratory distress. He has no wheezes. He has no rales.  Abdominal: Soft. He exhibits no distension and no mass. There is no tenderness. There is no rebound and no guarding.  Musculoskeletal: He exhibits no edema and no tenderness.  Lymphadenopathy:    He has no cervical adenopathy.  Neurological:       Knows Thursday, April (only after looking at calendar) Knows [facility] assisted [facility], [employer]- "the black guy"-- Felix Pacini on the news today ("I can't get him off my mind but I know it isn't him") Then "Barack Obama"  Skin:       Scabbed spot on concave right buttock--no ulcer  Psychiatric: He has a normal mood and affect. His behavior is normal.          Assessment & Plan:

## 2011-05-01 NOTE — Assessment & Plan Note (Signed)
Mild Likely vascular---will know more depending on progression Maintains identity and social relevance so not likely Altzheimers No Rx

## 2011-05-05 DIAGNOSIS — I10 Essential (primary) hypertension: Secondary | ICD-10-CM | POA: Diagnosis not present

## 2011-05-05 DIAGNOSIS — M109 Gout, unspecified: Secondary | ICD-10-CM | POA: Diagnosis not present

## 2011-05-12 ENCOUNTER — Ambulatory Visit (INDEPENDENT_AMBULATORY_CARE_PROVIDER_SITE_OTHER): Payer: Medicare Other | Admitting: Internal Medicine

## 2011-05-12 ENCOUNTER — Encounter: Payer: Self-pay | Admitting: Internal Medicine

## 2011-05-12 ENCOUNTER — Telehealth: Payer: Self-pay | Admitting: Internal Medicine

## 2011-05-12 VITALS — BP 140/70 | HR 92 | Temp 97.7°F | Wt 157.0 lb

## 2011-05-12 DIAGNOSIS — F411 Generalized anxiety disorder: Secondary | ICD-10-CM | POA: Diagnosis not present

## 2011-05-12 DIAGNOSIS — K5909 Other constipation: Secondary | ICD-10-CM

## 2011-05-12 DIAGNOSIS — K5900 Constipation, unspecified: Secondary | ICD-10-CM | POA: Diagnosis not present

## 2011-05-12 DIAGNOSIS — R634 Abnormal weight loss: Secondary | ICD-10-CM | POA: Diagnosis not present

## 2011-05-12 DIAGNOSIS — F419 Anxiety disorder, unspecified: Secondary | ICD-10-CM

## 2011-05-12 NOTE — Progress Notes (Signed)
Subjective:    Patient ID: Dustin Byrd, male    DOB: 1918-08-20, 78 y.o.   MRN: [id number]  HPI Daughter [name]  Noted trouble with bowels Worried about that he was bound up again Recent eval by Dr Jarold Motto didn't really indicate a problem  Seemed weak and "not himself"  Again notes that he is just passing fluid--not a "good movement" Feels tight in abdomen  See [name] e-mail Not eating much Weight has plummetted down Feels the food "won't go down"--just today No overt abdominal pain  He feels he is just depressed due to his "system" that isn't working well Does note chronic bowel problems though Current Outpatient Prescriptions on File Prior to Visit  Medication Sig Dispense Refill  . albuterol-ipratropium (COMBIVENT) 18-103 MCG/ACT inhaler Inhale 2 puffs into the lungs 3 (three) times daily.       Marland Kitchen allopurinol (ZYLOPRIM) 100 MG tablet Take 100 mg by mouth daily.      Marland Kitchen aspirin 81 MG tablet Take 81 mg by mouth daily.      . colchicine 0.6 MG tablet Take 0.6 mg by mouth daily.      Marland Kitchen LORazepam (ATIVAN) 1 MG tablet Take 1 mg by mouth at bedtime. And 0.5mg  in day as needed for anxiety      . mesalamine (PENTASA) 250 MG CR capsule Take 250 mg by mouth 4 (four) times daily.       . mirtazapine (REMERON) 7.5 MG tablet Take 7.5 mg by mouth at bedtime.      . nitroGLYCERIN (NITROSTAT) 0.4 MG SL tablet Place 0.4 mg under the tongue every 5 (five) minutes as needed.      . polyethylene glycol (MIRALAX / GLYCOLAX) packet Take 17 g by mouth 2 (two) times daily.         Allergies  Allergen Reactions  . Aricept (Donepezil Hydrochloride)     agitation  . Cortisone   . Prednisone     Mania     Past Medical History  Diagnosis Date  . Gout   . CAD (coronary artery disease)   . DJD of shoulder   . UC (ulcerative colitis)   . Asbestos exposure   . Hypercholesterolemia   . Esophageal stricture   . Orthostatic hypotension   . Personal history of colonic polyps 1991    villous adenoma of cecum  . Memory loss   . 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 Chronic kidney disease, stage III (moderate)   . Encounter for long-term (current) use of other medications   . Diverticulosis of colon (without mention of hemorrhage)   . Prostate cancer 1994  . Anxiety     Past Surgical History  Procedure Date  . Hemicolectomy 1991    right  . Coronary artery bypass graft 1997    x4  . Cataract extraction w/ intraocular lens  implant, bilateral   . Colostomy takedown 1996    Family History  Problem Relation Age of Onset  . Diabetes Father   . Bipolar disorder Daughter   . Stroke Mother     History   Social History  . Marital Status: Widowed    Spouse Name: N/A    Number of Children: 2  . Years of Education: N/A   Occupational History  . Textiles--various positions     Retired   Social History Main Topics  . Smoking status: Former Games developer  . Smokeless tobacco:  Never Used  . Alcohol Use: Yes  . Drug Use: No  . Sexually Active: Not on file   Other Topics Concern  . Not on file   Social History Narrative   2 daughtersNo living willDaughter Kennon Rounds has health care POADiscussed DNR---he requests this ("I want to go in peace")No tube feedings if cognitively unaware   Review of Systems No cough or SOB No fever    Objective:   Physical Exam  Constitutional: He appears well-developed. No distress.  Neck: Normal range of motion. Neck supple.  Cardiovascular: Normal rate, regular rhythm and normal heart sounds.  Exam reveals no gallop.   No murmur heard. Pulmonary/Chest: Effort normal and breath sounds normal. No respiratory distress. He has no wheezes. He has no rales.  Abdominal: Soft. Bowel sounds are normal. He exhibits no distension and no mass. There is no tenderness. There is no rebound and no guarding.  Musculoskeletal: He exhibits no edema and no tenderness.    Lymphadenopathy:    He has no cervical adenopathy.  Neurological:       Still evident memory problems  Psychiatric:       Seems depressed Mild psychomotor retardation          Assessment & Plan:

## 2011-05-12 NOTE — Telephone Encounter (Signed)
Phone call from Onida RN at assisted living now Having some spitting up Hasn't been eating and weight is down 10# Not impacted but not clear if bowels moving  Will try mineral oil enema Bland diet ER if worsens May need further eval ?CT scan

## 2011-05-12 NOTE — Assessment & Plan Note (Signed)
Seems to be worse Focuses on the bowels  Will add bid lorazepam

## 2011-05-12 NOTE — Patient Instructions (Signed)
Will arrange follow up at [facility]

## 2011-05-12 NOTE — Assessment & Plan Note (Signed)
Not clear he is bound up Probably has urgency from the constant MOM Likely this is somatization from his anxiety  Will stop MOM Continue miralax  May have enema weekly

## 2011-05-12 NOTE — Assessment & Plan Note (Signed)
Likely related to depression He is focused inappropriately on bowels---this is a long standing problem Will try increasing the mirtazapine

## 2011-05-15 ENCOUNTER — Telehealth: Payer: Self-pay | Admitting: Internal Medicine

## 2011-05-15 NOTE — Telephone Encounter (Signed)
Note from [facility] Still with swallowing problems Will start omeprazole

## 2011-06-20 ENCOUNTER — Emergency Department (HOSPITAL_COMMUNITY): Payer: Medicare Other

## 2011-06-20 ENCOUNTER — Emergency Department (HOSPITAL_COMMUNITY)
Admission: EM | Admit: 2011-06-20 | Discharge: 2011-06-20 | Disposition: A | Discharge status: Home / Self Care | Payer: Medicare Other | Attending: Emergency Medicine | Admitting: Emergency Medicine

## 2011-06-20 ENCOUNTER — Encounter (HOSPITAL_COMMUNITY): Payer: Self-pay | Admitting: Emergency Medicine

## 2011-06-20 DIAGNOSIS — M19019 Primary osteoarthritis, unspecified shoulder: Secondary | ICD-10-CM | POA: Diagnosis not present

## 2011-06-20 DIAGNOSIS — R6889 Other general symptoms and signs: Secondary | ICD-10-CM | POA: Diagnosis not present

## 2011-06-20 DIAGNOSIS — E7800 Pure hypercholesterolemia, unspecified: Secondary | ICD-10-CM | POA: Diagnosis not present

## 2011-06-20 DIAGNOSIS — I517 Cardiomegaly: Secondary | ICD-10-CM | POA: Diagnosis not present

## 2011-06-20 DIAGNOSIS — W010XXA Fall on same level from slipping, tripping and stumbling without subsequent striking against object, initial encounter: Secondary | ICD-10-CM | POA: Insufficient documentation

## 2011-06-20 DIAGNOSIS — I2510 Atherosclerotic heart disease of native coronary artery without angina pectoris: Secondary | ICD-10-CM | POA: Insufficient documentation

## 2011-06-20 DIAGNOSIS — I25810 Atherosclerosis of coronary artery bypass graft(s) without angina pectoris: Secondary | ICD-10-CM | POA: Insufficient documentation

## 2011-06-20 DIAGNOSIS — I129 Hypertensive chronic kidney disease with stage 1 through stage 4 chronic kidney disease, or unspecified chronic kidney disease: Secondary | ICD-10-CM | POA: Diagnosis not present

## 2011-06-20 DIAGNOSIS — Z85038 Personal history of other malignant neoplasm of large intestine: Secondary | ICD-10-CM | POA: Diagnosis not present

## 2011-06-20 DIAGNOSIS — N189 Chronic kidney disease, unspecified: Secondary | ICD-10-CM | POA: Insufficient documentation

## 2011-06-20 DIAGNOSIS — N183 Chronic kidney disease, stage 3 unspecified: Secondary | ICD-10-CM | POA: Diagnosis not present

## 2011-06-20 DIAGNOSIS — S0100XA Unspecified open wound of scalp, initial encounter: Secondary | ICD-10-CM | POA: Diagnosis not present

## 2011-06-20 DIAGNOSIS — M109 Gout, unspecified: Secondary | ICD-10-CM | POA: Insufficient documentation

## 2011-06-20 DIAGNOSIS — Z87891 Personal history of nicotine dependence: Secondary | ICD-10-CM | POA: Diagnosis not present

## 2011-06-20 DIAGNOSIS — S0101XA Laceration without foreign body of scalp, initial encounter: Secondary | ICD-10-CM

## 2011-06-20 DIAGNOSIS — T07XXXA Unspecified multiple injuries, initial encounter: Secondary | ICD-10-CM | POA: Diagnosis not present

## 2011-06-20 DIAGNOSIS — S060XAA Concussion with loss of consciousness status unknown, initial encounter: Secondary | ICD-10-CM | POA: Insufficient documentation

## 2011-06-20 DIAGNOSIS — F411 Generalized anxiety disorder: Secondary | ICD-10-CM | POA: Diagnosis not present

## 2011-06-20 DIAGNOSIS — S060X9A Concussion with loss of consciousness of unspecified duration, initial encounter: Secondary | ICD-10-CM | POA: Diagnosis not present

## 2011-06-20 DIAGNOSIS — M546 Pain in thoracic spine: Secondary | ICD-10-CM | POA: Diagnosis not present

## 2011-06-20 DIAGNOSIS — W19XXXA Unspecified fall, initial encounter: Secondary | ICD-10-CM | POA: Diagnosis not present

## 2011-06-20 DIAGNOSIS — Z9181 History of falling: Secondary | ICD-10-CM | POA: Diagnosis not present

## 2011-06-20 DIAGNOSIS — Z951 Presence of aortocoronary bypass graft: Secondary | ICD-10-CM | POA: Insufficient documentation

## 2011-06-20 DIAGNOSIS — R918 Other nonspecific abnormal finding of lung field: Secondary | ICD-10-CM | POA: Diagnosis not present

## 2011-06-20 DIAGNOSIS — R4182 Altered mental status, unspecified: Secondary | ICD-10-CM | POA: Insufficient documentation

## 2011-06-20 DIAGNOSIS — J8410 Pulmonary fibrosis, unspecified: Secondary | ICD-10-CM | POA: Diagnosis not present

## 2011-06-20 HISTORY — DX: Malignant neoplasm of colon, unspecified: C18.9

## 2011-06-20 LAB — DIFFERENTIAL
Basophils Absolute: 0 10*3/uL (ref 0.0–0.1)
Basophils Relative: 0 % (ref 0–1)
Eosinophils Absolute: 0 10*3/uL (ref 0.0–0.7)
Monocytes Relative: 5 % (ref 3–12)
Neutrophils Relative %: 88 % — ABNORMAL HIGH (ref 43–77)

## 2011-06-20 LAB — CBC
Hemoglobin: 15 g/dL (ref 13.0–17.0)
MCH: 31.4 pg (ref 26.0–34.0)
MCHC: 34 g/dL (ref 30.0–36.0)
Platelets: 121 10*3/uL — ABNORMAL LOW (ref 150–400)
RDW: 14.7 % (ref 11.5–15.5)

## 2011-06-20 LAB — URINALYSIS, ROUTINE W REFLEX MICROSCOPIC
Bilirubin Urine: NEGATIVE
Nitrite: NEGATIVE
Specific Gravity, Urine: 1.017 (ref 1.005–1.030)
Urobilinogen, UA: 0.2 mg/dL (ref 0.0–1.0)

## 2011-06-20 LAB — BASIC METABOLIC PANEL
BUN: 18 mg/dL (ref 6–23)
GFR calc Af Amer: 81 mL/min — ABNORMAL LOW (ref >90)
GFR calc non Af Amer: 70 mL/min — ABNORMAL LOW (ref >90)
Potassium: 4.1 mEq/L (ref 3.5–5.1)

## 2011-06-20 NOTE — ED Notes (Signed)
EKG was completed by former tech.

## 2011-06-20 NOTE — Discharge Instructions (Signed)
Altered Mental Status Altered mental status most often refers to an abnormal change in your responsiveness and awareness. It can affect your speech, thought, mobility, memory, attention span, or alertness. It can range from slight confusion to complete unresponsiveness (coma). Altered mental status can be a sign of a serious underlying medical condition. Rapid evaluation and medical treatment is necessary for patients having an altered mental status. CAUSES   Low blood sugar (hypoglycemia) or diabetes.   Severe loss of body fluids (dehydration) or a body salt (electrolyte) imbalance.   A stroke or other neurologic problem, such as dementia or delirium.   A head injury or tumor.   A drug or alcohol overdose.   Exposure to toxins or poisons.   Depression, anxiety, and stress.   A low oxygen level (hypoxia).   An infection.   Blood loss.   Twitching or shaking (seizure).   Heart problems, such as heart attack or heart rhythm problems (arrhythmias).   A body temperature that is too low or too high (hypothermia or hyperthermia).  DIAGNOSIS  A diagnosis is based on your history, symptoms, physical and neurologic examinations, and diagnostic tests. Diagnostic tests may include:  Measurement of your blood pressure, pulse, breathing, and oxygen levels (vital signs).   Blood tests.   Urine tests.   X-ray exams.   A computerized magnetic scan (magnetic resonance imaging, MRI).   A computerized X-ray scan (computed tomography, CT scan).  TREATMENT  Treatment will depend on the cause. Treatment may include:  Management of an underlying medical or mental health condition.   Critical care or support in the hospital.  HOME CARE INSTRUCTIONS   Only take over-the-counter or prescription medicines for pain, discomfort, or fever as directed by your caregiver.   Manage underlying conditions as directed by your caregiver.   Eat a healthy, well-balanced diet to maintain strength.    Join a support group or prevention program to cope with the condition or trauma that caused the altered mental status. Ask your caregiver to help choose a program that works for you.   Follow up with your caregiver for further examination, therapy, or testing as directed.  SEEK MEDICAL CARE IF:   You feel unwell or have chills.   You or your family notice a change in your behavior or your alertness.   You have trouble following your caregiver's treatment plan.   You have questions or concerns.  SEEK IMMEDIATE MEDICAL CARE IF:   You have a rapid heartbeat or have chest pain.   You have difficulty breathing.   You have a fever.   You have a headache with a stiff neck.   You cough up blood.   You have blood in your urine or stool.   You have severe agitation or confusion.  MAKE SURE YOU:   Understand these instructions.   Will watch your condition.   Will get help right away if you are not doing well or get worse.  Document Released: 06/12/2009 Document Revised: 12/12/2010 Document Reviewed: 06/12/2009 [facility] Patient Information 2012 [place], [place].  Concussion and Brain Injury A blow or jolt to the head can disrupt the normal function of the brain. This type of brain injury is often called a "concussion" or a "closed head injury." Concussions are usually not life-threatening. Even so, the effects of a concussion can be serious.  CAUSES  A concussion is caused by a blunt blow to the head. The blow might be direct or indirect as described below.  Direct  blow (running into another player during a soccer game, being hit in a fight, or hitting your head on a hard surface).   Indirect blow (when your head moves rapidly and violently back and forth like in a car crash).  SYMPTOMS  The brain is very complex. Every head injury is different. Some symptoms may appear right away. Other symptoms may not show up for days or weeks after the concussion. The signs of concussion can  be hard to notice. Early on, problems may be missed by patients, family members, and caregivers. You may look fine even though you are acting or feeling differently.  These symptoms are usually temporary, but may last for days, weeks, or even longer. Symptoms include:  Mild headaches that will not go away.   Having more trouble than usual with:   Remembering things.   Paying attention or concentrating.   Organizing daily tasks.   Making decisions and solving problems.   Slowness in thinking, acting, speaking, or reading.   Getting lost or easily confused.   Feeling tired all the time or lacking energy (fatigue).   Feeling drowsy.   Sleep disturbances.   Sleeping more than usual.   Sleeping less than usual.   Trouble falling asleep.   Trouble sleeping (insomnia).   Loss of balance or feeling lightheaded or dizzy.   Nausea or vomiting.   Numbness or tingling.   Increased sensitivity to:   Sounds.   Lights.   Distractions.  Other symptoms might include:  Vision problems or eyes that tire easily.   Diminished sense of taste or smell.   Ringing in the ears.   Mood changes such as feeling sad, anxious, or listless.   Becoming easily irritated or angry for little or no reason.   Lack of motivation.  DIAGNOSIS  Your caregiver can usually diagnose a concussion or mild brain injury based on your description of your injury and your symptoms.  Your evaluation might include:  A brain scan to look for signs of injury to the brain. Even if the test shows no injury, you may still have a concussion.   Blood tests to be sure other problems are not present.  TREATMENT   People with a concussion need to be examined and evaluated. Most people with concussions are treated in an emergency department, urgent care, or clinic. Some people must stay in the hospital overnight for further treatment.   Your caregiver will send you home with important instructions to follow. Be  sure to carefully follow them.   Tell your caregiver if you are already taking any medicines (prescription, over-the-counter, or natural remedies), or if you are drinking alcohol or taking illegal drugs. Also, talk with your caregiver if you are taking blood thinners (anticoagulants) or aspirin. These drugs may increase your chances of complications. All of this is important information that may affect treatment.   Only take over-the-counter or prescription medicines for pain, discomfort, or fever as directed by your caregiver.  PROGNOSIS  How fast people recover from brain injury varies from person to person. Although most people have a good recovery, how quickly they improve depends on many factors. These factors include how severe their concussion was, what part of the brain was injured, their age, and how healthy they were before the concussion.  Because all head injuries are different, so is recovery. Most people with mild injuries recover fully. Recovery can take time. In general, recovery is slower in older persons. Also, persons who have had  a concussion in the past or have other medical problems may find that it takes longer to recover from their current injury. Anxiety and depression may also make it harder to adjust to the symptoms of brain injury. HOME CARE INSTRUCTIONS  Return to your normal activities slowly, not all at once. You must give your body and brain enough time for recovery.  Get plenty of sleep at night, and rest during the day. Rest helps the brain to heal.   Avoid staying up late at night.   Keep the same bedtime hours on weekends and weekdays.   Take daytime naps or rest breaks when you feel tired.   Limit activities that require a lot of thought or concentration (brain or cognitive rest). This includes:   Homework or job-related work.   Watching TV.   Computer work.   Avoid activities that could lead to a second brain injury, such as contact or recreational  sports, until your caregiver says it is okay. Even after your brain injury has healed, you should protect yourself from having another concussion.   Ask your caregiver when you can return to your normal activities such as driving, bicycling, or operating heavy equipment. Your ability to react may be slower after a brain injury.   Talk with your caregiver about when you can return to work or school.   Inform your teachers, school nurse, school counselor, coach, Event organiser, or work Production designer, theatre/television/film about your injury, symptoms, and restrictions. They should be instructed to report:   Increased problems with attention or concentration.   Increased problems remembering or learning new information.   Increased time needed to complete tasks or assignments.   Increased irritability or decreased ability to cope with stress.   Increased symptoms.   Take only those medicines that your caregiver has approved.   Do not drink alcohol until your caregiver says you are well enough to do so. Alcohol and certain other drugs may slow your recovery and can put you at risk of further injury.   If it is harder than usual to remember things, write them down.   If you are easily distracted, try to do one thing at a time. For example, do not try to watch TV while fixing dinner.   Talk with family members or close friends when making important decisions.   Keep all follow-up appointments. Repeated evaluation of your symptoms is recommended for your recovery.  PREVENTION  Protect your head from future injury. It is very important to avoid another head or brain injury before you have recovered. In rare cases, another injury has lead to permanent brain damage, brain swelling, or death. Avoid injuries by using:  Seatbelts when riding in a car.   Alcohol only in moderation.   A helmet when biking, skiing, skateboarding, skating, or doing similar activities.   Safety measures in your home.   Remove clutter and  tripping hazards from floors and stairways.   Use grab bars in bathrooms and handrails by stairs.   Place non-slip mats on floors and in bathtubs.   Improve lighting in dim areas.  SEEK MEDICAL CARE IF:  A head injury can cause lingering symptoms. You should seek medical care if you have any of the following symptoms for more than 3 weeks after your injury or are planning to return to sports:  Chronic headaches.   Dizziness or balance problems.   Nausea.   Vision problems.   Increased sensitivity to noise or light.   Depression  or mood swings.   Anxiety or irritability.   Memory problems.   Difficulty concentrating or paying attention.   Sleep problems.   Feeling tired all the time.  SEEK IMMEDIATE MEDICAL CARE IF:  You have had a blow or jolt to the head and you (or your family or friends) notice:  Severe or worsening headaches.   Weakness (even if only in one hand or one leg or one part of the face), numbness, or decreased coordination.   Repeated vomiting.   Increased sleepiness or passing out.   One black center of the eye (pupil) is larger than the other.   Convulsions (seizures).   Slurred speech.   Increasing confusion, restlessness, agitation, or irritability.   Lack of ability to recognize people or places.   Neck pain.   Difficulty being awakened.   Unusual behavior changes.   Loss of consciousness.  Older adults with a brain injury may have a higher risk of serious complications such as a blood clot on the brain. Headaches that get worse or an increase in confusion are signs of this complication. If these signs occur, see a caregiver right away. MAKE SURE YOU:   Understand these instructions.   Will watch your condition.   Will get help right away if you are not doing well or get worse.  FOR MORE INFORMATION  Several groups help people with brain injury and their families. They provide information and put people in touch with local  resources. These include support groups, rehabilitation services, and a variety of health care professionals. Among these groups, the Brain Injury Association (BIA, www.biausa.org) has a Secretary/administrator that gathers scientific and educational information and works on a national level to help people with brain injury.  Document Released: 03/15/2003 Document Revised: 12/12/2010 Document Reviewed: 08/11/2007 [facility] Patient Information 2012 [place], [place].  RESOURCE GUIDE  Dental Problems  Patients with Medicaid: [facility] [phone] [address]                                           [phone] [address] Phone:  [phone]                                                   Phone:  [phone]  If unable to pay or uninsured, contact:  [facility] or [facility]. to become qualified for the adult dental clinic.  Chronic Pain Problems Contact [facility]  [phone] Patients need to be referred by their primary care doctor.  Insufficient Money for Medicine Contact United Way:  call "211" or [facility] [phone].  No Primary Care Doctor Call [facility]  [phone] Other agencies that provide inexpensive medical care    [facility]  [phone]    [facility]  [phone]    [facility]  6038772710    [facility]  [phone]    [facility]  8105057134    [facility]  [phone]  Psychological Services [facility]  [phone] [place] [facility]  [phone] [facility]  [phone] G6628420 (emergency services [phone])  Abuse/Neglect [facility] Child Abuse Hotline ([phone] 041-2039 [facility] Child Abuse Hotline (712) [phone] (After Hours)  Emergency Shelter [facility] 3462123368  M[facility] [place] [facility] [phone] [place] [facility] [phone]  MRSA  Hotline #:   (418)588-6862    [facility] Resources  [facility]  United [employer]                           [facility] [address] [place]                     [address]         [address] [place] [address]  Blondell Reveal Phone:  [phone]                                  Phone:  [phone]                   Phone:  [phone]  [facility] Phone:  [phone]  [facility] Child Abuse Hotline 678 736 [phone] (After Hours)

## 2011-06-20 NOTE — ED Provider Notes (Signed)
History     CSN: [id number]  Arrival date & time 06/20/11  4098   First MD Initiated Contact with Patient 06/20/11 1014      Chief Complaint  Patient presents with  . Fall    (Consider location/radiation/quality/duration/timing/severity/associated sxs/prior treatment) Patient is a 78 y.o. male presenting with fall. The history is provided by the patient.  Fall Associated symptoms include headaches. Pertinent negatives include no fever.   patient slipped and fell and hit his head. Complaining of pain in the back of his head and right shoulder. No loss of conscious. No abdominal pain. No other injuries. Is not on blood thinners. A fall is not unusual for him. He has a laceration to the back of his head.  Past Medical History  Diagnosis Date  . Gout   . CAD (coronary artery disease)   . DJD of shoulder   . UC (ulcerative colitis)   . Asbestos exposure   . Hypercholesterolemia   . Esophageal stricture   . Orthostatic hypotension   . Personal history of colonic polyps 1991    villous adenoma of cecum  . Memory loss   . 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 Chronic kidney disease, stage III (moderate)   . Encounter for long-term (current) use of other medications   . Diverticulosis of colon (without mention of hemorrhage)   . Anxiety   . Colon cancer 1994    Past Surgical History  Procedure Date  . Hemicolectomy 1991    right  . Colostomy takedown 1996  . Open heart surgery     CABG x4  . Coronary artery bypass graft     x4    Family History  Problem Relation Age of Onset  . Diabetes Father   . Bipolar disorder Daughter   . Stroke Mother     History  Substance Use Topics  . Smoking status: Former Games developer  . Smokeless tobacco: Never Used  . Alcohol Use: Yes     1-2 oz Scotch per night      Review of Systems  Constitutional: Negative for fever and fatigue.  Respiratory: Negative for  cough.   Cardiovascular: Negative for chest pain.  Musculoskeletal: Negative for back pain.  Neurological: Positive for headaches. Negative for syncope and speech difficulty.    Allergies  Aricept; Cortisone; and Prednisone  Home Medications   Current Outpatient Rx  Name Route Sig Dispense Refill  . IPRATROPIUM-ALBUTEROL 18-103 MCG/ACT IN AERO Inhalation Inhale 2 puffs into the lungs 3 (three) times daily.     . ALLOPURINOL 100 MG PO TABS Oral Take 100 mg by mouth daily.    . ASPIRIN 81 MG PO TABS Oral Take 81 mg by mouth daily.    . COLCHICINE 0.6 MG PO TABS Oral Take 0.6 mg by mouth daily.    Marland Kitchen LORAZEPAM 0.5 MG PO TABS Oral Take 0.5-1 mg by mouth See admin instructions. 1 tab bid, 2 tab qhs    . MESALAMINE ER 250 MG PO CPCR Oral Take 250 mg by mouth 4 (four) times daily.     Marland Kitchen MIRTAZAPINE 15 MG PO TABS Oral Take 15 mg by mouth at bedtime.    Marland Kitchen NITROGLYCERIN 0.4 MG SL SUBL Sublingual Place 0.4 mg under the tongue every 5 (five) minutes as needed. For chest pain.    Marland Kitchen OMEPRAZOLE 20 MG PO CPDR Oral Take 20 mg by mouth daily.    Marland Kitchen  POLYETHYLENE GLYCOL 3350 PO PACK Oral Take 17 g by mouth 2 (two) times daily.     Bernadette Hoit SODIUM 8.6-50 MG PO TABS Oral Take 2 tablets by mouth 2 (two) times daily.      BP 168/84  Pulse 88  Temp 98 F (36.7 C) (Oral)  Resp 18  Physical Exam  Constitutional: He appears well-developed.  HENT:       2 cm hematoma with central laceration to occipital area.  Eyes: Pupils are equal, round, and reactive to light.  Neck: Neck supple.  Cardiovascular: Normal rate and regular rhythm.   Pulmonary/Chest: Effort normal.  Abdominal: There is no tenderness.  Musculoskeletal: Normal range of motion.       Mild tenderness to right medial scapula. No ecchymosis. No spine tenderness.    ED Course  Procedures (including critical care time)  Labs Reviewed - No data to display Dg Chest 2 View  06/20/2011  *RADIOLOGY REPORT*  Clinical Data: Altered  mental status.  History of fall.  CHEST - 2 VIEW  Comparison: Chest x-ray 06/14/2003.  Findings: There is a 1 cm calcified granuloma in the left upper lobe (unchanged).  Lung volumes are slightly low.  There are bibasilar opacities favored to represent areas of atelectasis and/or scarring.  No acute consolidative airspace disease.  Minimal blunting of the right costophrenic sulcus may represent chronic pleural scarring or a trace right-sided pleural effusion.  No definite left pleural effusion.  Pulmonary vasculature is normal. Heart size is mildly enlarged. The patient is rotated to the right on today's exam, resulting in distortion of the mediastinal contours and reduced diagnostic sensitivity and specificity for mediastinal pathology.  Atherosclerotic calcifications within the thoracic aorta.  Status post median sternotomy for CABG with a LIMA.  IMPRESSION: 1.  Low lung volumes with probable bibasilar subsegmental atelectasis and/or scarring. 2.  Query right-sided pleuroparenchymal scarring versus trace right- sided pleural effusion. 3.  Calcified granuloma in the left upper lobe again noted. 4.  Mild cardiomegaly is unchanged. 5.  Atherosclerosis. 6.  Status post median sternotomy for CABG with a LIMA.  Original Report Authenticated By: Florencia Reasons, M.D.   Dg Scapula Right  06/20/2011  *RADIOLOGY REPORT*  Clinical Data: Fall  RIGHT SCAPULA - 2+ VIEWS  Comparison: None.  Findings: Two views of the right scapula submitted.  No acute fracture or subluxation.  Degenerative changes right acromioclavicular joint.  IMPRESSION: No acute fracture or subluxation.  Degenerative changes right acromioclavicular joint.  Original Report Authenticated By: Natasha Mead, M.D.   Ct Head Wo Contrast  06/20/2011  *RADIOLOGY REPORT*  Clinical Data: Altered mental status.  CT HEAD WITHOUT CONTRAST  Technique:  Contiguous axial images were obtained from the base of the skull through the vertex without contrast.  Comparison:  Head CT 06/20/2011.  Findings: Again noted is some soft tissue swelling in the left occipital scalp with a soft tissue staple in place, consistent with a laceration.  No acute displaced skull fracture is identified. There is moderate cerebral and cerebellar atrophy which is age appropriate.  Patchy and confluent areas of decreased attenuation throughout the deep and periventricular white matter of the cerebral hemispheres bilaterally is consistent with chronic microvascular ischemic changes.  No acute intracranial abnormality. Specifically, no signs of acute post-traumatic intracranial hemorrhage, no definite evidence of acute/subacute cerebral ischemia, no focal mass, mass effect, hydrocephalus or abnormal intra or extra-axial fluid collections.  Visualized paranasal sinuses and mastoids are well pneumatized.  IMPRESSION: 1.  Small left  occipital scalp laceration without evidence of underlying displaced skull fracture or acute intracranial abnormality. 2.  The appearance of brain is unchanged compared to the recent prior examination, as above.  Original Report Authenticated By: Florencia Reasons, M.D.   Ct Head Wo Contrast  06/20/2011  *RADIOLOGY REPORT*  Clinical Data: Posterior head laceration status post fall today.  CT HEAD WITHOUT CONTRAST  Technique:  Contiguous axial images were obtained from the base of the skull through the vertex without contrast.  Comparison: Head CT 06/14/2003.  MRI brain 06/01/2009.  Findings: There is age appropriate atrophy with stable minimal periventricular white matter disease for age.  No acute intracranial hemorrhage, mass lesion, brain edema or extra-axial fluid collection is seen.  There is mild soft tissue swelling in the midline occipital scalp. There is no evidence of calvarial fracture.  The visualized paranasal sinuses are clear.  Intracranial vascular calcifications are noted.  IMPRESSION:  1.  Posterior scalp soft tissue injury. 2.  No acute intracranial or  calvarial findings.  Original Report Authenticated By: Gerrianne Scale, M.D.     1. Fall   2. Scalp laceration    LACERATION REPAIR Performed by: Billee Cashing. Authorized by: Billee Cashing Consent: Verbal consent obtained. Risks and benefits: risks, benefits and alternatives were discussed Consent given by: patient Patient identity confirmed: provided demographic data  Wound explored  Laceration Location: occiput   1cm  No Foreign Bodies seen or palpated  Amount of cleaning: standard  Skin closure:staple  Number of sutures: 1  Technique: staple  Patient tolerance: Patient tolerated the procedure well with no immediate complications.  MDM  Patient stumble and fall outside the dentist office. No loss of conscious. He did have a scalp laceration. Patient states no dizziness or lightheadedness just fell. He is awake and at baseline. Wound was closed. Also tenderness over right scapula. Negative x-ray.        Juliet Rude. Rubin Payor, MD 06/21/11 [phone]

## 2011-06-20 NOTE — ED Notes (Addendum)
Pt alert and oriented to person and place. Able to identify children's age, but unable to determine president or time and place. Pt resides in [facility] in [place], [place] and thinks there are people who were asking him questions that made them not to be trusted. He thinks he was being framed and arrested. Pt family states he was never been like this before. Wound on the back of the head is still bleeding with staple in place. 4x4 guaze placed under due to the lack of bandage. Pain in right shoulder blade and right side of back and abdomen. Pt passed [place] Stroke Scale.

## 2011-06-20 NOTE — ED Notes (Signed)
To Ed via [employer]- lives in [facility]-- Assisted living, was picked up at dentists office -- stumbled and fell. Laceration to back of head. Small lac and hematoma to occipital area-- no LOC, bleeding controlled.

## 2011-06-20 NOTE — Discharge Instructions (Signed)

## 2011-06-20 NOTE — ED Notes (Signed)
Pt report per daughter. States pt was discharged at 1500 from previous emergency department visit due to fall at 0845 in a parking lot at [employer] office. Pt got his feet twisted together and fell backwards, hitting the back of his head which he got stapled.  Pt was acting confused when he got home and started talking about his late wife and talking to his daughters who are 67 and 76 years of age as if they were children.

## 2011-06-20 NOTE — ED Notes (Signed)
[id number]<AV> Expected date:<BR> Expected time:<BR> Means of arrival:<BR> Comments:<BR> 89 yom fall

## 2011-06-21 NOTE — ED Provider Notes (Addendum)
History     CSN: [id number]  Arrival date & time 06/20/11  1818   First MD Initiated Contact with Patient 06/20/11 1832      Chief Complaint  Patient presents with  . Altered Mental Status   Level 5 caveat confusion/ams  (Consider location/radiation/quality/duration/timing/severity/associated sxs/prior treatment) HPI  Dustin Byrd mmp pw AMS after fall. Pt evaluated in ED earlier today after mechanical fall with BHT. Was discharged in stable condition. Returned to NH and was moved to healthcare ward for closer monitoring over the weekend. Per family he has been quite confused and agitated as well. His sensorium has now cleared but does continue to maintain level of confusion and paranoia. He denies all complaints at this time. Denies recent illness/fever/chills/cp/sob/abd pain/n/v. CT head in ED unremarkable.    ED Notes, ED Provider Notes from 06/20/11 0000 to 06/20/11 19:02:35       Leonides Cave 06/20/2011 18:54      Pt alert and oriented to person and place. Able to identify children's age, but unable to determine president or time and place. Pt resides in [facility] in [place], [place] and thinks there are people who were asking him questions that made them not to be trusted. He thinks he was being framed and arrested. Pt family states he was never been like this before. Wound on the back of the head is still bleeding with staple in place. 4x4 guaze placed under due to the lack of bandage. Pain in right shoulder blade and right side of back and abdomen. Pt passed [place] Stroke Scale.    Past Medical History  Diagnosis Date  . Gout   . CAD (coronary artery disease)   . DJD of shoulder   . UC (ulcerative colitis)   . Asbestos exposure   . Hypercholesterolemia   . Esophageal stricture   . Orthostatic hypotension   . Personal history of colonic polyps 1991    villous adenoma of cecum  . Memory loss   . Benign hypertensive kidney disease with chronic kidney  disease stage I through stage IV, or unspecified   . Chronic kidney disease, stage III (moderate)   . Encounter for long-term (current) use of other medications   . Diverticulosis of colon (without mention of hemorrhage)   . Anxiety   . Colon cancer 1994    Past Surgical History  Procedure Date  . Hemicolectomy 1991    right  . Colostomy takedown 1996  . Open heart surgery     CABG x4  . Coronary artery bypass graft     x4    Family History  Problem Relation Age of Onset  . Diabetes Father   . Bipolar disorder Daughter   . Stroke Mother     History  Substance Use Topics  . Smoking status: Former Games developer  . Smokeless tobacco: Never Used  . Alcohol Use: Yes     1-2 oz Scotch per night      Review of Systems  All other systems reviewed and are negative.   except as noted HPI   Allergies  Aricept; Cortisone; and Prednisone  Home Medications   Current Outpatient Rx  Name Route Sig Dispense Refill  . IPRATROPIUM-ALBUTEROL 18-103 MCG/ACT IN AERO Inhalation Inhale 2 puffs into the lungs 3 (three) times daily.     . ALLOPURINOL 100 MG PO TABS Oral Take 100 mg by mouth daily.    . ASPIRIN 81 MG PO TABS Oral Take 81 mg by mouth daily.    Marland Kitchen  COLCHICINE 0.6 MG PO TABS Oral Take 0.6 mg by mouth daily.    Marland Kitchen LORAZEPAM 0.5 MG PO TABS Oral Take 0.5-1 mg by mouth See admin instructions. 1 tab bid, 2 tab qhs    . MESALAMINE ER 250 MG PO CPCR Oral Take 250 mg by mouth 4 (four) times daily.     Marland Kitchen MIRTAZAPINE 15 MG PO TABS Oral Take 15 mg by mouth at bedtime.    Marland Kitchen NITROGLYCERIN 0.4 MG SL SUBL Sublingual Place 0.4 mg under the tongue every 5 (five) minutes as needed. For chest pain.    Marland Kitchen OMEPRAZOLE 20 MG PO CPDR Oral Take 20 mg by mouth daily.    Marland Kitchen POLYETHYLENE GLYCOL 3350 PO PACK Oral Take 17 g by mouth 2 (two) times daily.     Bernadette Hoit SODIUM 8.6-50 MG PO TABS Oral Take 2 tablets by mouth 2 (two) times daily.      BP 169/93  Pulse 92  Temp 98.6 F (37 C) (Oral)   Resp 15  SpO2 97%  Physical Exam  Nursing note and vitals reviewed. Constitutional: He is oriented to person, place, and time. He appears well-developed and well-nourished. No distress.  HENT:  Mouth/Throat: Oropharynx is clear and moist.       Laceration posterior occ with staple in place. No active bleeding but spotting of blood on pillow  Eyes: Conjunctivae are normal. Pupils are equal, round, and reactive to light.  Neck: Neck supple.  Cardiovascular: Normal rate, regular rhythm, normal heart sounds and intact distal pulses.  Exam reveals no gallop and no friction rub.   No murmur heard. Pulmonary/Chest: Effort normal. No respiratory distress. He has no wheezes. He has no rales.  Abdominal: Soft. Bowel sounds are normal. There is no tenderness. There is no rebound and no guarding.  Musculoskeletal: Normal range of motion. He exhibits no edema and no tenderness.  Neurological: He is alert and oriented to person, place, and time. No cranial nerve deficit. He exhibits normal muscle tone. Coordination normal.  Skin: Skin is warm and dry.  Psychiatric: He has a normal mood and affect.    ED Course  Procedures (including critical care time)  Labs Reviewed  CBC - Abnormal; Notable for the following:    Platelets 121 (*)     All other components within normal limits  DIFFERENTIAL - Abnormal; Notable for the following:    Neutrophils Relative 88 (*)     Lymphocytes Relative 7 (*)     Lymphs Abs 0.6 (*)     All other components within normal limits  BASIC METABOLIC PANEL - Abnormal; Notable for the following:    GFR calc non Af Amer 70 (*)     GFR calc Af Amer 81 (*)     All other components within normal limits  URINALYSIS, ROUTINE W REFLEX MICROSCOPIC - Abnormal; Notable for the following:    Ketones, ur TRACE (*)     All other components within normal limits  LAB REPORT - SCANNED   Dg Chest 2 View  06/20/2011  *RADIOLOGY REPORT*  Clinical Data: Altered mental status.   History of fall.  CHEST - 2 VIEW  Comparison: Chest x-ray 06/14/2003.  Findings: There is a 1 cm calcified granuloma in the left upper lobe (unchanged).  Lung volumes are slightly low.  There are bibasilar opacities favored to represent areas of atelectasis and/or scarring.  No acute consolidative airspace disease.  Minimal blunting of the right costophrenic sulcus may represent chronic pleural  scarring or a trace right-sided pleural effusion.  No definite left pleural effusion.  Pulmonary vasculature is normal. Heart size is mildly enlarged. The patient is rotated to the right on today's exam, resulting in distortion of the mediastinal contours and reduced diagnostic sensitivity and specificity for mediastinal pathology.  Atherosclerotic calcifications within the thoracic aorta.  Status post median sternotomy for CABG with a LIMA.  IMPRESSION: 1.  Low lung volumes with probable bibasilar subsegmental atelectasis and/or scarring. 2.  Query right-sided pleuroparenchymal scarring versus trace right- sided pleural effusion. 3.  Calcified granuloma in the left upper lobe again noted. 4.  Mild cardiomegaly is unchanged. 5.  Atherosclerosis. 6.  Status post median sternotomy for CABG with a LIMA.  Original Report Authenticated By: Florencia Reasons, M.D.   Dg Scapula Right  06/20/2011  *RADIOLOGY REPORT*  Clinical Data: Fall  RIGHT SCAPULA - 2+ VIEWS  Comparison: None.  Findings: Two views of the right scapula submitted.  No acute fracture or subluxation.  Degenerative changes right acromioclavicular joint.  IMPRESSION: No acute fracture or subluxation.  Degenerative changes right acromioclavicular joint.  Original Report Authenticated By: Natasha Mead, M.D.   Ct Head Wo Contrast  06/20/2011  *RADIOLOGY REPORT*  Clinical Data: Altered mental status.  CT HEAD WITHOUT CONTRAST  Technique:  Contiguous axial images were obtained from the base of the skull through the vertex without contrast.  Comparison: Head CT  06/20/2011.  Findings: Again noted is some soft tissue swelling in the left occipital scalp with a soft tissue staple in place, consistent with a laceration.  No acute displaced skull fracture is identified. There is moderate cerebral and cerebellar atrophy which is age appropriate.  Patchy and confluent areas of decreased attenuation throughout the deep and periventricular white matter of the cerebral hemispheres bilaterally is consistent with chronic microvascular ischemic changes.  No acute intracranial abnormality. Specifically, no signs of acute post-traumatic intracranial hemorrhage, no definite evidence of acute/subacute cerebral ischemia, no focal mass, mass effect, hydrocephalus or abnormal intra or extra-axial fluid collections.  Visualized paranasal sinuses and mastoids are well pneumatized.  IMPRESSION: 1.  Small left occipital scalp laceration without evidence of underlying displaced skull fracture or acute intracranial abnormality. 2.  The appearance of brain is unchanged compared to the recent prior examination, as above.  Original Report Authenticated By: Florencia Reasons, M.D.   Ct Head Wo Contrast  06/20/2011  *RADIOLOGY REPORT*  Clinical Data: Posterior head laceration status post fall today.  CT HEAD WITHOUT CONTRAST  Technique:  Contiguous axial images were obtained from the base of the skull through the vertex without contrast.  Comparison: Head CT 06/14/2003.  MRI brain 06/01/2009.  Findings: There is age appropriate atrophy with stable minimal periventricular white matter disease for age.  No acute intracranial hemorrhage, mass lesion, brain edema or extra-axial fluid collection is seen.  There is mild soft tissue swelling in the midline occipital scalp. There is no evidence of calvarial fracture.  The visualized paranasal sinuses are clear.  Intracranial vascular calcifications are noted.  IMPRESSION:  1.  Posterior scalp soft tissue injury. 2.  No acute intracranial or calvarial  findings.  Original Report Authenticated By: Gerrianne Scale, M.D.     1. Altered mental status   2. Concussion     MDM  S/p mechanical fall with acute onset confusion. Repeat CT head without change--no delayed ICH. No source of infection found in ED. He is alert and oriented although remains somewhat confused.This is likely multifactorial 2/2 recent  BHT as well as change in normal living environment.  He has been monitored in ED for several hours without adverse event. Do not feel that inpatient admission is warranted and discussed this with family who is happy to take him back to senior care facility, he will continue to have a higher level of care throughout the weekend.        Forbes Cellar, MD 06/21/11 4782  Forbes Cellar, MD 06/21/11 [phone]

## 2011-06-23 DIAGNOSIS — F0390 Unspecified dementia without behavioral disturbance: Secondary | ICD-10-CM | POA: Diagnosis not present

## 2011-06-23 DIAGNOSIS — I10 Essential (primary) hypertension: Secondary | ICD-10-CM | POA: Diagnosis not present

## 2011-06-23 DIAGNOSIS — F411 Generalized anxiety disorder: Secondary | ICD-10-CM | POA: Diagnosis not present

## 2011-06-23 DIAGNOSIS — S060X9A Concussion with loss of consciousness of unspecified duration, initial encounter: Secondary | ICD-10-CM | POA: Diagnosis not present

## 2011-06-24 DIAGNOSIS — M6281 Muscle weakness (generalized): Secondary | ICD-10-CM | POA: Diagnosis not present

## 2011-06-24 DIAGNOSIS — R5381 Other malaise: Secondary | ICD-10-CM | POA: Diagnosis not present

## 2011-06-24 DIAGNOSIS — R262 Difficulty in walking, not elsewhere classified: Secondary | ICD-10-CM | POA: Diagnosis not present

## 2011-06-25 DIAGNOSIS — F329 Major depressive disorder, single episode, unspecified: Secondary | ICD-10-CM | POA: Diagnosis not present

## 2011-06-25 DIAGNOSIS — R5383 Other fatigue: Secondary | ICD-10-CM | POA: Diagnosis not present

## 2011-06-25 DIAGNOSIS — M6281 Muscle weakness (generalized): Secondary | ICD-10-CM | POA: Diagnosis not present

## 2011-06-25 DIAGNOSIS — R262 Difficulty in walking, not elsewhere classified: Secondary | ICD-10-CM | POA: Diagnosis not present

## 2011-06-26 DIAGNOSIS — R5381 Other malaise: Secondary | ICD-10-CM | POA: Diagnosis not present

## 2011-06-26 DIAGNOSIS — M6281 Muscle weakness (generalized): Secondary | ICD-10-CM | POA: Diagnosis not present

## 2011-06-26 DIAGNOSIS — R262 Difficulty in walking, not elsewhere classified: Secondary | ICD-10-CM | POA: Diagnosis not present

## 2011-06-27 DIAGNOSIS — R262 Difficulty in walking, not elsewhere classified: Secondary | ICD-10-CM | POA: Diagnosis not present

## 2011-06-27 DIAGNOSIS — R5383 Other fatigue: Secondary | ICD-10-CM | POA: Diagnosis not present

## 2011-06-27 DIAGNOSIS — M6281 Muscle weakness (generalized): Secondary | ICD-10-CM | POA: Diagnosis not present

## 2011-06-30 DIAGNOSIS — R5381 Other malaise: Secondary | ICD-10-CM | POA: Diagnosis not present

## 2011-06-30 DIAGNOSIS — M6281 Muscle weakness (generalized): Secondary | ICD-10-CM | POA: Diagnosis not present

## 2011-06-30 DIAGNOSIS — R262 Difficulty in walking, not elsewhere classified: Secondary | ICD-10-CM | POA: Diagnosis not present

## 2011-07-01 DIAGNOSIS — R5381 Other malaise: Secondary | ICD-10-CM | POA: Diagnosis not present

## 2011-07-01 DIAGNOSIS — M6281 Muscle weakness (generalized): Secondary | ICD-10-CM | POA: Diagnosis not present

## 2011-07-01 DIAGNOSIS — R262 Difficulty in walking, not elsewhere classified: Secondary | ICD-10-CM | POA: Diagnosis not present

## 2011-07-02 DIAGNOSIS — R5381 Other malaise: Secondary | ICD-10-CM | POA: Diagnosis not present

## 2011-07-02 DIAGNOSIS — R262 Difficulty in walking, not elsewhere classified: Secondary | ICD-10-CM | POA: Diagnosis not present

## 2011-07-02 DIAGNOSIS — M6281 Muscle weakness (generalized): Secondary | ICD-10-CM | POA: Diagnosis not present

## 2011-07-03 DIAGNOSIS — R262 Difficulty in walking, not elsewhere classified: Secondary | ICD-10-CM | POA: Diagnosis not present

## 2011-07-03 DIAGNOSIS — R5381 Other malaise: Secondary | ICD-10-CM | POA: Diagnosis not present

## 2011-07-03 DIAGNOSIS — M6281 Muscle weakness (generalized): Secondary | ICD-10-CM | POA: Diagnosis not present

## 2011-07-04 DIAGNOSIS — R5383 Other fatigue: Secondary | ICD-10-CM | POA: Diagnosis not present

## 2011-07-04 DIAGNOSIS — R262 Difficulty in walking, not elsewhere classified: Secondary | ICD-10-CM | POA: Diagnosis not present

## 2011-07-04 DIAGNOSIS — M6281 Muscle weakness (generalized): Secondary | ICD-10-CM | POA: Diagnosis not present

## 2011-07-07 DIAGNOSIS — R262 Difficulty in walking, not elsewhere classified: Secondary | ICD-10-CM | POA: Diagnosis not present

## 2011-07-07 DIAGNOSIS — M6281 Muscle weakness (generalized): Secondary | ICD-10-CM | POA: Diagnosis not present

## 2011-07-07 DIAGNOSIS — R279 Unspecified lack of coordination: Secondary | ICD-10-CM | POA: Diagnosis not present

## 2011-07-08 DIAGNOSIS — M6281 Muscle weakness (generalized): Secondary | ICD-10-CM | POA: Diagnosis not present

## 2011-07-08 DIAGNOSIS — R262 Difficulty in walking, not elsewhere classified: Secondary | ICD-10-CM | POA: Diagnosis not present

## 2011-07-08 DIAGNOSIS — R279 Unspecified lack of coordination: Secondary | ICD-10-CM | POA: Diagnosis not present

## 2011-07-09 DIAGNOSIS — R262 Difficulty in walking, not elsewhere classified: Secondary | ICD-10-CM | POA: Diagnosis not present

## 2011-07-09 DIAGNOSIS — M6281 Muscle weakness (generalized): Secondary | ICD-10-CM | POA: Diagnosis not present

## 2011-07-09 DIAGNOSIS — R279 Unspecified lack of coordination: Secondary | ICD-10-CM | POA: Diagnosis not present

## 2011-07-10 DIAGNOSIS — M6281 Muscle weakness (generalized): Secondary | ICD-10-CM | POA: Diagnosis not present

## 2011-07-10 DIAGNOSIS — R262 Difficulty in walking, not elsewhere classified: Secondary | ICD-10-CM | POA: Diagnosis not present

## 2011-07-10 DIAGNOSIS — R279 Unspecified lack of coordination: Secondary | ICD-10-CM | POA: Diagnosis not present

## 2011-07-11 DIAGNOSIS — R279 Unspecified lack of coordination: Secondary | ICD-10-CM | POA: Diagnosis not present

## 2011-07-11 DIAGNOSIS — M6281 Muscle weakness (generalized): Secondary | ICD-10-CM | POA: Diagnosis not present

## 2011-07-11 DIAGNOSIS — R262 Difficulty in walking, not elsewhere classified: Secondary | ICD-10-CM | POA: Diagnosis not present

## 2011-07-12 DIAGNOSIS — R279 Unspecified lack of coordination: Secondary | ICD-10-CM | POA: Diagnosis not present

## 2011-07-12 DIAGNOSIS — R262 Difficulty in walking, not elsewhere classified: Secondary | ICD-10-CM | POA: Diagnosis not present

## 2011-07-12 DIAGNOSIS — M6281 Muscle weakness (generalized): Secondary | ICD-10-CM | POA: Diagnosis not present

## 2011-07-14 DIAGNOSIS — M6281 Muscle weakness (generalized): Secondary | ICD-10-CM | POA: Diagnosis not present

## 2011-07-14 DIAGNOSIS — R279 Unspecified lack of coordination: Secondary | ICD-10-CM | POA: Diagnosis not present

## 2011-07-14 DIAGNOSIS — R262 Difficulty in walking, not elsewhere classified: Secondary | ICD-10-CM | POA: Diagnosis not present

## 2011-07-15 DIAGNOSIS — R279 Unspecified lack of coordination: Secondary | ICD-10-CM | POA: Diagnosis not present

## 2011-07-15 DIAGNOSIS — R262 Difficulty in walking, not elsewhere classified: Secondary | ICD-10-CM | POA: Diagnosis not present

## 2011-07-15 DIAGNOSIS — M6281 Muscle weakness (generalized): Secondary | ICD-10-CM | POA: Diagnosis not present

## 2011-07-16 DIAGNOSIS — M6281 Muscle weakness (generalized): Secondary | ICD-10-CM | POA: Diagnosis not present

## 2011-07-16 DIAGNOSIS — R262 Difficulty in walking, not elsewhere classified: Secondary | ICD-10-CM | POA: Diagnosis not present

## 2011-07-16 DIAGNOSIS — R279 Unspecified lack of coordination: Secondary | ICD-10-CM | POA: Diagnosis not present

## 2011-07-17 DIAGNOSIS — M6281 Muscle weakness (generalized): Secondary | ICD-10-CM | POA: Diagnosis not present

## 2011-07-17 DIAGNOSIS — R262 Difficulty in walking, not elsewhere classified: Secondary | ICD-10-CM | POA: Diagnosis not present

## 2011-07-17 DIAGNOSIS — R279 Unspecified lack of coordination: Secondary | ICD-10-CM | POA: Diagnosis not present

## 2011-07-18 DIAGNOSIS — M6281 Muscle weakness (generalized): Secondary | ICD-10-CM | POA: Diagnosis not present

## 2011-07-18 DIAGNOSIS — R279 Unspecified lack of coordination: Secondary | ICD-10-CM | POA: Diagnosis not present

## 2011-07-18 DIAGNOSIS — R262 Difficulty in walking, not elsewhere classified: Secondary | ICD-10-CM | POA: Diagnosis not present

## 2011-07-21 DIAGNOSIS — S060X9A Concussion with loss of consciousness of unspecified duration, initial encounter: Secondary | ICD-10-CM | POA: Diagnosis not present

## 2011-07-21 DIAGNOSIS — I10 Essential (primary) hypertension: Secondary | ICD-10-CM | POA: Diagnosis not present

## 2011-07-21 DIAGNOSIS — F411 Generalized anxiety disorder: Secondary | ICD-10-CM | POA: Diagnosis not present

## 2011-07-21 DIAGNOSIS — F0150 Vascular dementia without behavioral disturbance: Secondary | ICD-10-CM | POA: Diagnosis not present

## 2011-08-28 ENCOUNTER — Non-Acute Institutional Stay (INDEPENDENT_AMBULATORY_CARE_PROVIDER_SITE_OTHER): Payer: Medicare Other | Admitting: Internal Medicine

## 2011-08-28 ENCOUNTER — Encounter: Payer: Self-pay | Admitting: Internal Medicine

## 2011-08-28 VITALS — BP 110/60 | HR 68 | Temp 98.0°F | Resp 16 | Wt 155.0 lb

## 2011-08-28 DIAGNOSIS — I10 Essential (primary) hypertension: Secondary | ICD-10-CM

## 2011-08-28 DIAGNOSIS — F0390 Unspecified dementia without behavioral disturbance: Secondary | ICD-10-CM

## 2011-08-28 DIAGNOSIS — F419 Anxiety disorder, unspecified: Secondary | ICD-10-CM

## 2011-08-28 DIAGNOSIS — K5190 Ulcerative colitis, unspecified, without complications: Secondary | ICD-10-CM

## 2011-08-28 DIAGNOSIS — I2510 Atherosclerotic heart disease of native coronary artery without angina pectoris: Secondary | ICD-10-CM

## 2011-08-28 DIAGNOSIS — K5900 Constipation, unspecified: Secondary | ICD-10-CM | POA: Diagnosis not present

## 2011-08-28 DIAGNOSIS — K5909 Other constipation: Secondary | ICD-10-CM

## 2011-08-28 NOTE — Progress Notes (Signed)
Subjective:    Patient ID: Dustin Byrd, male    DOB: 10-Nov-1918, 78 y.o.   MRN: [id number]  HPI Has adjusted back here in assisted living Has still had more confusion since fall and head laceration  Had a couple of choking spells last week No apparent aspiration  No problems since then and swallows fine in general Eating "like a pig" Weight is holding fairly steady now  No chest pain No SOB No dizziness or syncope No edema  Bowels have been okay Doesn't seem so focused on this now No abdominal pain No blood Did note one episode of stool incontinence since coming back here  Happy to be back here "I don't want to go back over there"  Current Outpatient Prescriptions on File Prior to Visit  Medication Sig Dispense Refill  . allopurinol (ZYLOPRIM) 100 MG tablet Take 100 mg by mouth daily.      Marland Kitchen aspirin 81 MG tablet Take 81 mg by mouth daily.      . colchicine 0.6 MG tablet Take 0.6 mg by mouth daily.      Marland Kitchen LORazepam (ATIVAN) 0.5 MG tablet Take 0.5 mg by mouth 3 (three) times daily.       . mesalamine (PENTASA) 250 MG CR capsule Take 250 mg by mouth 4 (four) times daily.       . mirtazapine (REMERON) 15 MG tablet Take 15 mg by mouth at bedtime.      . nitroGLYCERIN (NITROSTAT) 0.4 MG SL tablet Place 0.4 mg under the tongue every 5 (five) minutes as needed. For chest pain.      Marland Kitchen omeprazole (PRILOSEC) 20 MG capsule Take 20 mg by mouth daily.      . polyethylene glycol (MIRALAX / GLYCOLAX) packet Take 17 g by mouth 2 (two) times daily.       Marland Kitchen senna-docusate (SENOKOT-S) 8.6-50 MG per tablet Take 2 tablets by mouth 2 (two) times daily.        Allergies  Allergen Reactions  . Aricept (Donepezil Hydrochloride)     agitation  . Cortisone   . Prednisone     Mania     Past Medical History  Diagnosis Date  . Gout   . CAD (coronary artery disease)   . DJD of shoulder   . UC (ulcerative colitis)   . Asbestos exposure   . Hypercholesterolemia   . Esophageal stricture     . Orthostatic hypotension   . Personal history of colonic polyps 1991    villous adenoma of cecum  . Memory loss   . 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 Chronic kidney disease, stage III (moderate)   . Encounter for long-term (current) use of other medications   . Diverticulosis of colon (without mention of hemorrhage)   . Anxiety   . Colon cancer 1994    Past Surgical History  Procedure Date  . Hemicolectomy 1991    right  . Colostomy takedown 1996  . Open heart surgery     CABG x4  . Coronary artery bypass graft     x4    Family History  Problem Relation Age of Onset  . Diabetes Father   . Bipolar disorder Daughter   . Stroke Mother     History   Social History  . Marital Status: Widowed    Spouse Name: N/A    Number of Children: 2  . Years of Education: N/A  Occupational History  . Textiles--various positions     Retired   Social History Main Topics  . Smoking status: Former Games developer  . Smokeless tobacco: Never Used  . Alcohol Use: Yes     1-2 oz Scotch per night  . Drug Use: No  . Sexually Active: Not on file   Other Topics Concern  . Not on file   Social History Narrative   2 daughtersNo living willDaughter Kennon Rounds has health care POADiscussed DNR---he requests this ("I want to go in peace")No tube feedings if cognitively unaware   Review of Systems Has crusted area on chest he wants checked Sleeping okay     Objective:   Physical Exam  Constitutional: He appears well-developed and well-nourished. No distress.  Neck: Normal range of motion. Neck supple. No thyromegaly present.  Cardiovascular: Normal rate, regular rhythm and normal heart sounds.  Exam reveals no gallop.   No murmur heard. Pulmonary/Chest: Effort normal and breath sounds normal. No respiratory distress. He has no wheezes. He has no rales. He exhibits no tenderness.       His concern spot was just bony  prominence at costo-clavicular junction on right  Musculoskeletal: He exhibits no edema and no tenderness.  Lymphadenopathy:    He has no cervical adenopathy.  Neurological:       Mild confusion but appropriate interaction and social appropriateness  Skin: Rash noted.       Still slight erythema along both convex buttocks with slight crust---overall better than in past  Psychiatric: He has a normal mood and affect. His behavior is normal.          Assessment & Plan:

## 2011-08-28 NOTE — Assessment & Plan Note (Signed)
Seems to be quiet Hasn't needed NTG

## 2011-08-28 NOTE — Assessment & Plan Note (Signed)
Bowels are better He is not so consumed with compulsive concern for his bowels now Continue cathartic regimen

## 2011-08-28 NOTE — Assessment & Plan Note (Signed)
BP Readings from Last 3 Encounters:  08/28/11 110/60  06/20/11 169/93  06/20/11 168/84   Good control now No meds

## 2011-08-28 NOTE — Assessment & Plan Note (Signed)
Clearly more prominent since his recent concussion I expect that this should be stable Doing well functionally so appropriate for AL still

## 2011-08-28 NOTE — Assessment & Plan Note (Signed)
Seems to be controlled with the pentasa

## 2011-08-28 NOTE — Assessment & Plan Note (Signed)
Seems better Will continue the mirtazapine for sleep and appetite ---at least for now

## 2011-10-08 ENCOUNTER — Ambulatory Visit: Payer: Medicare Other | Admitting: Internal Medicine

## 2011-10-30 ENCOUNTER — Non-Acute Institutional Stay (INDEPENDENT_AMBULATORY_CARE_PROVIDER_SITE_OTHER): Payer: Medicare Other | Admitting: Internal Medicine

## 2011-10-30 ENCOUNTER — Encounter: Payer: Self-pay | Admitting: Internal Medicine

## 2011-10-30 VITALS — BP 100/60 | HR 66 | Temp 97.4°F | Resp 22 | Wt 156.0 lb

## 2011-10-30 DIAGNOSIS — K5190 Ulcerative colitis, unspecified, without complications: Secondary | ICD-10-CM

## 2011-10-30 DIAGNOSIS — F411 Generalized anxiety disorder: Secondary | ICD-10-CM

## 2011-10-30 DIAGNOSIS — F0390 Unspecified dementia without behavioral disturbance: Secondary | ICD-10-CM | POA: Diagnosis not present

## 2011-10-30 DIAGNOSIS — I2510 Atherosclerotic heart disease of native coronary artery without angina pectoris: Secondary | ICD-10-CM

## 2011-10-30 DIAGNOSIS — K5909 Other constipation: Secondary | ICD-10-CM

## 2011-10-30 DIAGNOSIS — M109 Gout, unspecified: Secondary | ICD-10-CM

## 2011-10-30 DIAGNOSIS — F419 Anxiety disorder, unspecified: Secondary | ICD-10-CM

## 2011-10-30 NOTE — Assessment & Plan Note (Signed)
Notes that he has always been a high anxiety person Discussed cutting meds---he feels this would be a bad idea On the mirtazapine for appetite and sleep---will continue

## 2011-10-30 NOTE — Assessment & Plan Note (Signed)
Cognition and function seem stable Mostly from past traumatic brain injury?

## 2011-10-30 NOTE — Assessment & Plan Note (Signed)
Quiet on the colchicine 

## 2011-10-30 NOTE — Assessment & Plan Note (Signed)
No apparent symptoms On aspirin

## 2011-10-30 NOTE — Assessment & Plan Note (Signed)
Seems to be quiet with pentasa No changes needed

## 2011-10-30 NOTE — Progress Notes (Signed)
Subjective:    Patient ID: Dustin Byrd, male    DOB: 1918/09/23, 78 y.o.   MRN: [id number]  HPI Doing well per Albin Felling RN No major concerns--back in routine  Does have a rash on his left neck---thinks it may be from cord from emergency pager  Seems better now that he kept the thing off He does go out at times   Bowels continue to be fine No pain in abdomen No blood in stool  Still notes "hyper" nature Feels he still needs the lorazepam  Still has some memory problems No apparent progression Still independent with all ADLs---does have supervision with a bath  Gout has been quiet Still on colchicine  No stomach troubles No heartburn  Current Outpatient Prescriptions on File Prior to Visit  Medication Sig Dispense Refill  . allopurinol (ZYLOPRIM) 100 MG tablet Take 100 mg by mouth daily.      Marland Kitchen aspirin 81 MG tablet Take 81 mg by mouth daily.      . colchicine 0.6 MG tablet Take 0.6 mg by mouth daily.      Marland Kitchen LORazepam (ATIVAN) 0.5 MG tablet Take 0.5 mg by mouth 3 (three) times daily.       . mesalamine (PENTASA) 250 MG CR capsule Take 250 mg by mouth 4 (four) times daily.       . mirtazapine (REMERON) 15 MG tablet Take 15 mg by mouth at bedtime.      . nitroGLYCERIN (NITROSTAT) 0.4 MG SL tablet Place 0.4 mg under the tongue every 5 (five) minutes as needed. For chest pain.      Marland Kitchen omeprazole (PRILOSEC) 20 MG capsule Take 20 mg by mouth daily.      . polyethylene glycol (MIRALAX / GLYCOLAX) packet Take 17 g by mouth 2 (two) times daily.       Marland Kitchen senna-docusate (SENOKOT-S) 8.6-50 MG per tablet Take 2 tablets by mouth 2 (two) times daily.        Allergies  Allergen Reactions  . Aricept (Donepezil Hydrochloride)     agitation  . Cortisone   . Prednisone     Mania     Past Medical History  Diagnosis Date  . Gout   . CAD (coronary artery disease)   . DJD of shoulder   . UC (ulcerative colitis)   . Asbestos exposure   . Hypercholesterolemia   . Esophageal stricture     . Orthostatic hypotension   . Personal history of colonic polyps 1991    villous adenoma of cecum  . Memory loss   . Benign hypertensive kidney disease with chronic kidney disease stage I through stage IV, or unspecified(403.10)   . Chronic kidney disease, stage III (moderate)   . Encounter for long-term (current) use of other medications   . Diverticulosis of colon (without mention of hemorrhage)   . Anxiety   . Colon cancer 1994    Past Surgical History  Procedure Date  . Hemicolectomy 1991    right  . Colostomy takedown 1996  . Open heart surgery     CABG x4  . Coronary artery bypass graft     x4    Family History  Problem Relation Age of Onset  . Diabetes Father   . Bipolar disorder Daughter   . Stroke Mother     History   Social History  . Marital Status: Widowed    Spouse Name: N/A    Number of Children: 2  . Years of Education: N/A  Occupational History  . Textiles--various positions     Retired   Social History Main Topics  . Smoking status: Former Games developer  . Smokeless tobacco: Never Used  . Alcohol Use: Yes     1-2 oz Scotch per night  . Drug Use: No  . Sexually Active: Not on file   Other Topics Concern  . Not on file   Social History Narrative   2 daughtersNo living willDaughter Kennon Rounds has health care POADiscussed DNR---he requests this ("I want to go in peace")No tube feedings if cognitively unaware   Review of Systems Appetite continues to be good Weight stable Sleep is "not that good"---thinks it may be adequate Does take some naps ---may be affecting his sleep No chest pain No breathing problems    Objective:   Physical Exam  Constitutional: He appears well-developed and well-nourished. No distress.  Neck: Normal range of motion. Neck supple. No thyromegaly present.  Cardiovascular: Normal rate, regular rhythm and normal heart sounds.  Exam reveals no gallop.   No murmur heard. Pulmonary/Chest: Effort normal and breath sounds  normal. No respiratory distress. He has no wheezes. He has no rales.  Abdominal: Soft. He exhibits no distension. There is no tenderness. There is no guarding.  Musculoskeletal: He exhibits no edema and no tenderness.  Lymphadenopathy:    He has no cervical adenopathy.  Psychiatric: He has a normal mood and affect. His behavior is normal.          Assessment & Plan:

## 2011-10-30 NOTE — Assessment & Plan Note (Signed)
Has not been an issue lately A blessing considering his preoccupation of the past

## 2011-11-05 DIAGNOSIS — Z23 Encounter for immunization: Secondary | ICD-10-CM | POA: Diagnosis not present

## 2012-01-09 ENCOUNTER — Ambulatory Visit: Payer: Medicare Other | Admitting: Adult Health

## 2012-01-29 ENCOUNTER — Encounter: Payer: Self-pay | Admitting: Internal Medicine

## 2012-01-29 ENCOUNTER — Non-Acute Institutional Stay (INDEPENDENT_AMBULATORY_CARE_PROVIDER_SITE_OTHER): Payer: Medicare Other | Admitting: Internal Medicine

## 2012-01-29 VITALS — BP 100/60 | HR 61 | Temp 97.6°F | Resp 16 | Wt 154.0 lb

## 2012-01-29 DIAGNOSIS — F0390 Unspecified dementia without behavioral disturbance: Secondary | ICD-10-CM

## 2012-01-29 DIAGNOSIS — M109 Gout, unspecified: Secondary | ICD-10-CM

## 2012-01-29 DIAGNOSIS — F411 Generalized anxiety disorder: Secondary | ICD-10-CM

## 2012-01-29 DIAGNOSIS — I2510 Atherosclerotic heart disease of native coronary artery without angina pectoris: Secondary | ICD-10-CM

## 2012-01-29 DIAGNOSIS — I10 Essential (primary) hypertension: Secondary | ICD-10-CM

## 2012-01-29 DIAGNOSIS — F419 Anxiety disorder, unspecified: Secondary | ICD-10-CM

## 2012-01-29 NOTE — Assessment & Plan Note (Signed)
May have some cognitive decline but still able to be in assisted living No Rx for this

## 2012-01-29 NOTE — Assessment & Plan Note (Signed)
Has been quiet on meds

## 2012-01-29 NOTE — Progress Notes (Signed)
Subjective:    Patient ID: Dustin Byrd, male    DOB: [birth year], 79 y.o.   MRN: [id number]  HPI Reviewed status with Albin Felling RN  Has been having more confusion lately. Sleeping more in the day Committed to the support group---at times questions his wife's death Mild depression at times---"I am just a worn out old man" Has thought about dying---no real suicidal ideation  Still has some degree of anxiety Doesn't seem as significant though  Stomach has been fine No sig heartburn No abdominal pain Bowels are okay---no blood  No chest pain No SOB No edema  Current Outpatient Prescriptions on File Prior to Visit  Medication Sig Dispense Refill  . allopurinol (ZYLOPRIM) 100 MG tablet Take 100 mg by mouth daily.      Marland Kitchen aspirin 81 MG tablet Take 81 mg by mouth daily.      . colchicine 0.6 MG tablet Take 0.6 mg by mouth daily.      Marland Kitchen LORazepam (ATIVAN) 0.5 MG tablet Take 0.5 mg by mouth 3 (three) times daily.       . mesalamine (PENTASA) 250 MG CR capsule Take 250 mg by mouth 4 (four) times daily.       . mirtazapine (REMERON) 15 MG tablet Take 15 mg by mouth at bedtime.      . nitroGLYCERIN (NITROSTAT) 0.4 MG SL tablet Place 0.4 mg under the tongue every 5 (five) minutes as needed. For chest pain.      Marland Kitchen omeprazole (PRILOSEC) 20 MG capsule Take 20 mg by mouth daily.      . polyethylene glycol (MIRALAX / GLYCOLAX) packet Take 17 g by mouth 2 (two) times daily.       Marland Kitchen senna-docusate (SENOKOT-S) 8.6-50 MG per tablet Take 2 tablets by mouth 2 (two) times daily.        Allergies  Allergen Reactions  . Aricept (Donepezil Hydrochloride)     agitation  . Cortisone   . Prednisone     Mania     Past Medical History  Diagnosis Date  . Gout   . CAD (coronary artery disease)   . DJD of shoulder   . UC (ulcerative colitis)   . Asbestos exposure   . Hypercholesterolemia   . Esophageal stricture   . Orthostatic hypotension   . Personal history of colonic polyps 1991    villous  adenoma of cecum  . Memory loss   . Benign hypertensive kidney disease with chronic kidney disease stage I through stage IV, or unspecified(403.10)   . Chronic kidney disease, stage III (moderate)   . Encounter for long-term (current) use of other medications   . Diverticulosis of colon (without mention of hemorrhage)   . Anxiety   . Colon cancer 1994    Past Surgical History  Procedure Date  . Hemicolectomy 1991    right  . Colostomy takedown 1996  . Open heart surgery     CABG x4  . Coronary artery bypass graft     x4    Family History  Problem Relation Age of Onset  . Diabetes Father   . Bipolar disorder Daughter   . Stroke Mother     History   Social History  . Marital Status: Widowed    Spouse Name: N/A    Number of Children: 2  . Years of Education: N/A   Occupational History  . Textiles--various positions     Retired   Social History Main Topics  . Smoking status: Former  Smoker  . Smokeless tobacco: Never Used  . Alcohol Use: Yes     Comment: 1-2 oz Scotch per night  . Drug Use: No  . Sexually Active: Not on file   Other Topics Concern  . Not on file   Social History Narrative   2 daughtersNo living willDaughter Kennon Rounds has health care POADiscussed DNR---he requests this ("I want to go in peace")No tube feedings if cognitively unaware   Review of Systems Sleeps okay Appetite is okay now Weight is stable    Objective:   Physical Exam  Constitutional: He appears well-developed and well-nourished. No distress.  Neck: Normal range of motion. Neck supple.  Cardiovascular: Normal rate, regular rhythm and normal heart sounds.  Exam reveals no gallop.   No murmur heard. Pulmonary/Chest: Effort normal and breath sounds normal. No respiratory distress. He has no wheezes. He has no rales.  Abdominal: Soft. There is no tenderness.  Musculoskeletal: He exhibits no edema and no tenderness.  Lymphadenopathy:    He has no cervical adenopathy.  Psychiatric:         Melancholy without overt depression ??slight psychomotor retardation          Assessment & Plan:

## 2012-01-29 NOTE — Assessment & Plan Note (Signed)
BP Readings from Last 3 Encounters:  01/29/12 100/60  10/30/11 100/60  08/28/11 110/60   Remains on low side  no rx

## 2012-01-29 NOTE — Assessment & Plan Note (Signed)
Has ongoing anxiety Some overlying grief--will be trying the support group Sleeps well with mirtazapine --will continue Try decreasing lorazepam since he is sleeping more in day---will add prn order in case

## 2012-01-29 NOTE — Assessment & Plan Note (Signed)
No recent symptoms Hasn't needed NTG No changes

## 2012-02-09 ENCOUNTER — Ambulatory Visit: Payer: Self-pay | Admitting: Internal Medicine

## 2012-02-09 DIAGNOSIS — R1310 Dysphagia, unspecified: Secondary | ICD-10-CM | POA: Diagnosis not present

## 2012-03-25 DIAGNOSIS — I259 Chronic ischemic heart disease, unspecified: Secondary | ICD-10-CM | POA: Diagnosis not present

## 2012-03-25 DIAGNOSIS — M109 Gout, unspecified: Secondary | ICD-10-CM | POA: Diagnosis not present

## 2012-04-07 ENCOUNTER — Encounter: Payer: Self-pay | Admitting: Internal Medicine

## 2012-04-29 ENCOUNTER — Encounter: Payer: Self-pay | Admitting: Internal Medicine

## 2012-04-29 ENCOUNTER — Non-Acute Institutional Stay (INDEPENDENT_AMBULATORY_CARE_PROVIDER_SITE_OTHER): Payer: Medicare Other | Admitting: Internal Medicine

## 2012-04-29 VITALS — BP 110/60 | HR 75 | Temp 97.5°F | Resp 18 | Wt 146.0 lb

## 2012-04-29 DIAGNOSIS — K5190 Ulcerative colitis, unspecified, without complications: Secondary | ICD-10-CM | POA: Diagnosis not present

## 2012-04-29 DIAGNOSIS — F419 Anxiety disorder, unspecified: Secondary | ICD-10-CM

## 2012-04-29 DIAGNOSIS — F411 Generalized anxiety disorder: Secondary | ICD-10-CM

## 2012-04-29 DIAGNOSIS — I10 Essential (primary) hypertension: Secondary | ICD-10-CM

## 2012-04-29 DIAGNOSIS — K5909 Other constipation: Secondary | ICD-10-CM

## 2012-04-29 DIAGNOSIS — F0390 Unspecified dementia without behavioral disturbance: Secondary | ICD-10-CM | POA: Diagnosis not present

## 2012-04-29 DIAGNOSIS — K5900 Constipation, unspecified: Secondary | ICD-10-CM | POA: Diagnosis not present

## 2012-04-29 DIAGNOSIS — M109 Gout, unspecified: Secondary | ICD-10-CM

## 2012-04-29 NOTE — Assessment & Plan Note (Signed)
Satisfied with his current regimen 

## 2012-04-29 NOTE — Assessment & Plan Note (Signed)
BP Readings from Last 3 Encounters:  04/29/12 110/60  01/29/12 100/60  10/30/11 100/60   Fine without meds

## 2012-04-29 NOTE — Assessment & Plan Note (Signed)
Persists but not worse with lower dose of lorazepam Seems to sleep well with mirtazapine so will continue this as well

## 2012-04-29 NOTE — Assessment & Plan Note (Signed)
Mild confusion has not progressed No acute problems since not leaving facility on his own or trying to drive

## 2012-04-29 NOTE — Assessment & Plan Note (Signed)
Quiet on allopurinol 

## 2012-04-29 NOTE — Progress Notes (Signed)
Subjective:    Patient ID: Dustin Byrd, male    DOB: 1918/07/13, 79 y.o.   MRN: [id number]  HPI Reviewed status with Albin Felling RN Has not been eating great--trouble with on top Doesn't really have much pain--- accepts that things are not going to be great at almost 89 Weight is down 8# or so--but he feels it has stabilized Is on enlive and med pass  Mild confusion persists No apparent profession  Has right eye "black spot"  Aggravates him No eye appointment---he doesn't really want to pursue  Still has some anxiety He feels it is under reasonable control Still gets tired during the day---but not clearly related to his meds Does have intermittent depression ---along and family "spread out" Not anhedonic and really likes it here  Some trouble with bowels--okay with senna and miralax No abdominal pain No blood in stool  Current Outpatient Prescriptions on File Prior to Visit  Medication Sig Dispense Refill  . allopurinol (ZYLOPRIM) 100 MG tablet Take 100 mg by mouth daily.      Marland Kitchen aspirin 81 MG tablet Take 81 mg by mouth daily.      . colchicine 0.6 MG tablet Take 0.6 mg by mouth daily.      Marland Kitchen LORazepam (ATIVAN) 0.5 MG tablet Take 0.25 mg by mouth 3 (three) times daily. And 0.25 tid prn      . mesalamine (PENTASA) 250 MG CR capsule Take 250 mg by mouth 4 (four) times daily.       . mirtazapine (REMERON) 15 MG tablet Take 15 mg by mouth at bedtime.      . nitroGLYCERIN (NITROSTAT) 0.4 MG SL tablet Place 0.4 mg under the tongue every 5 (five) minutes as needed. For chest pain.      Marland Kitchen omeprazole (PRILOSEC) 20 MG capsule Take 20 mg by mouth daily.      . polyethylene glycol (MIRALAX / GLYCOLAX) packet Take 17 g by mouth 2 (two) times daily.       Marland Kitchen senna-docusate (SENOKOT-S) 8.6-50 MG per tablet Take 2 tablets by mouth 2 (two) times daily.       No current facility-administered medications on file prior to visit.    Allergies  Allergen Reactions  . Aricept (Donepezil  Hydrochloride)     agitation  . Cortisone   . Prednisone     Mania     Past Medical History  Diagnosis Date  . Gout   . CAD (coronary artery disease)   . DJD of shoulder   . UC (ulcerative colitis)   . Asbestos exposure   . Hypercholesterolemia   . Esophageal stricture   . Orthostatic hypotension   . Personal history of colonic polyps 1991    villous adenoma of cecum  . Memory loss   . Benign hypertensive kidney disease with chronic kidney disease stage I through stage IV, or unspecified(403.10)   . Chronic kidney disease, stage III (moderate)   . Encounter for long-term (current) use of other medications   . Diverticulosis of colon (without mention of hemorrhage)   . Anxiety   . Colon cancer 1994    Past Surgical History  Procedure Laterality Date  . Hemicolectomy  1991    right  . Colostomy takedown  1996  . Open heart surgery      CABG x4  . Coronary artery bypass graft      x4    Family History  Problem Relation Age of Onset  . Diabetes Father   .  Bipolar disorder Daughter   . Stroke Mother     History   Social History  . Marital Status: Widowed    Spouse Name: N/A    Number of Children: 2  . Years of Education: N/A   Occupational History  . Textiles--various positions     Retired   Social History Main Topics  . Smoking status: Former Games developer  . Smokeless tobacco: Never Used  . Alcohol Use: Yes     Comment: 1-2 oz Scotch per night  . Drug Use: No  . Sexually Active: Not on file   Other Topics Concern  . Not on file   Social History Narrative   2 daughters      No living will   Daughter Kennon Rounds has health care POA   Discussed DNR---he requests this ("I want to go in peace")   No tube feedings if cognitively unaware   Review of Systems No problems with gout Sleeps fairly well Voids okay--no sig nocturia and is continent     Objective:   Physical Exam  Constitutional: He appears well-developed. No distress.  Neck: Normal range of  motion. Neck supple.  Cardiovascular: Normal rate, regular rhythm and normal heart sounds.  Exam reveals no gallop.   No murmur heard. Pulmonary/Chest: Effort normal and breath sounds normal. No respiratory distress. He has no wheezes. He has no rales.  Abdominal: Soft. There is no tenderness.  Musculoskeletal: He exhibits no edema and no tenderness.  Lymphadenopathy:    He has no cervical adenopathy.  Psychiatric: He has a normal mood and affect. His behavior is normal.          Assessment & Plan:

## 2012-04-29 NOTE — Assessment & Plan Note (Signed)
Doesn't appear to be active but has lost weight Will at least seek dental reevaluation for partials if not able to maintain weight Continue the mesalamine

## 2012-05-20 ENCOUNTER — Telehealth: Payer: Self-pay | Admitting: Internal Medicine

## 2012-05-20 NOTE — Telephone Encounter (Signed)
Reviewed with Albin Felling RN Had been on citalopram also in the past Will try this again since depression may be part of his decline

## 2012-05-27 DIAGNOSIS — R1312 Dysphagia, oropharyngeal phase: Secondary | ICD-10-CM | POA: Diagnosis not present

## 2012-06-17 ENCOUNTER — Other Ambulatory Visit: Payer: Self-pay | Admitting: Internal Medicine

## 2012-06-17 NOTE — Telephone Encounter (Signed)
Okay to refill x 5

## 2012-06-17 NOTE — Telephone Encounter (Signed)
rx sent to pharmacy by e-script  

## 2012-07-14 ENCOUNTER — Ambulatory Visit: Payer: Medicare Other | Admitting: Internal Medicine

## 2012-07-15 ENCOUNTER — Ambulatory Visit: Payer: Medicare Other | Admitting: Internal Medicine

## 2012-07-16 ENCOUNTER — Encounter: Payer: Medicare Other | Admitting: Family Medicine

## 2012-07-22 ENCOUNTER — Encounter: Payer: Self-pay | Admitting: Internal Medicine

## 2012-07-22 ENCOUNTER — Other Ambulatory Visit: Payer: Self-pay | Admitting: Internal Medicine

## 2012-07-22 ENCOUNTER — Non-Acute Institutional Stay: Payer: Medicare Other | Admitting: Internal Medicine

## 2012-07-22 VITALS — BP 142/66 | HR 62 | Resp 16 | Wt 146.0 lb

## 2012-07-22 DIAGNOSIS — L570 Actinic keratosis: Secondary | ICD-10-CM

## 2012-07-22 DIAGNOSIS — F411 Generalized anxiety disorder: Secondary | ICD-10-CM | POA: Diagnosis not present

## 2012-07-22 DIAGNOSIS — M109 Gout, unspecified: Secondary | ICD-10-CM

## 2012-07-22 DIAGNOSIS — K5190 Ulcerative colitis, unspecified, without complications: Secondary | ICD-10-CM | POA: Diagnosis not present

## 2012-07-22 DIAGNOSIS — F0390 Unspecified dementia without behavioral disturbance: Secondary | ICD-10-CM

## 2012-07-22 DIAGNOSIS — I10 Essential (primary) hypertension: Secondary | ICD-10-CM

## 2012-07-22 DIAGNOSIS — F419 Anxiety disorder, unspecified: Secondary | ICD-10-CM

## 2012-07-22 NOTE — Assessment & Plan Note (Signed)
Mild confusion persists but stable Able to maintain ADLs to stay in assisted living No agitation

## 2012-07-22 NOTE — Assessment & Plan Note (Signed)
Lesion on right ear treated with cryotherapy 50 seconds x 2 Tolerated well To derm if this recurs

## 2012-07-22 NOTE — Assessment & Plan Note (Signed)
Chronic constipation but no signs of active colitis Continue the mesalamine

## 2012-07-22 NOTE — Progress Notes (Signed)
Subjective:    Patient ID: Dustin Byrd, male    DOB: [birth year], 79 y.o.   MRN: [id number]  HPI Reviewed status with RN  He feels things are going well Eating is still not great Ongoing problem with upper denture Weight is stable since last visit  Having some numbness in fingers--just the tips and more lateral Left hand--not really right Trouble making tight fist No sig pain there Discussed possible ulnar nerve issue  Anxiety persists Seems to have some symptom relief with the meds Doesn't feel it is any worse  New skin lesion on top of right ear No pain  Bowels "are locked up" now Still on miralax and senekot Has had small stools lately No blood or pain  No chest pain No SOB No dizziness or syncope  Current Outpatient Prescriptions on File Prior to Visit  Medication Sig Dispense Refill  . allopurinol (ZYLOPRIM) 100 MG tablet Take 100 mg by mouth daily.      Marland Kitchen aspirin 81 MG tablet Take 81 mg by mouth daily.      . citalopram (CELEXA) 10 MG tablet Take 10 mg by mouth daily.      . colchicine 0.6 MG tablet Take 0.6 mg by mouth daily.      . Dermatological Products, Misc. (ITCH-ENDER!) LOTN APPLY TO ULCER AREA 3 TIMES A DAY AS DIRECTED.  120 mL  5  . loratadine (CLARITIN) 10 MG tablet Take 10 mg by mouth daily as needed for allergies. For secretions      . LORazepam (ATIVAN) 0.5 MG tablet Take 0.25 mg by mouth 3 (three) times daily. And 0.25 tid prn      . mesalamine (PENTASA) 250 MG CR capsule Take 250 mg by mouth 4 (four) times daily.       . mirtazapine (REMERON) 15 MG tablet Take 15 mg by mouth at bedtime.      . nitroGLYCERIN (NITROSTAT) 0.4 MG SL tablet Place 0.4 mg under the tongue every 5 (five) minutes as needed. For chest pain.      Marland Kitchen omeprazole (PRILOSEC) 20 MG capsule Take 20 mg by mouth daily.      . polyethylene glycol (MIRALAX / GLYCOLAX) packet Take 17 g by mouth 2 (two) times daily.       Marland Kitchen senna-docusate (SENOKOT-S) 8.6-50 MG per tablet Take 2 tablets  by mouth 2 (two) times daily.       No current facility-administered medications on file prior to visit.    Allergies  Allergen Reactions  . Aricept (Donepezil Hydrochloride)     agitation  . Cortisone   . Prednisone     Mania     Past Medical History  Diagnosis Date  . Gout   . CAD (coronary artery disease)   . DJD of shoulder   . UC (ulcerative colitis)   . Asbestos exposure   . Hypercholesterolemia   . Esophageal stricture   . Orthostatic hypotension   . Personal history of colonic polyps 1991    villous adenoma of cecum  . Memory loss   . Benign hypertensive kidney disease with chronic kidney disease stage I through stage IV, or unspecified(403.10)   . Chronic kidney disease, stage III (moderate)   . Encounter for long-term (current) use of other medications   . Diverticulosis of colon (without mention of hemorrhage)   . Anxiety   . Colon cancer 1994    Past Surgical History  Procedure Laterality Date  . Hemicolectomy  1991  right  . Colostomy takedown  1996  . Open heart surgery      CABG x4  . Coronary artery bypass graft      x4    Family History  Problem Relation Age of Onset  . Diabetes Father   . Bipolar disorder Daughter   . Stroke Mother     History   Social History  . Marital Status: Widowed    Spouse Name: N/A    Number of Children: 2  . Years of Education: N/A   Occupational History  . Textiles--various positions     Retired   Social History Main Topics  . Smoking status: Former Games developer  . Smokeless tobacco: Never Used  . Alcohol Use: Yes     Comment: 1-2 oz Scotch per night  . Drug Use: No  . Sexually Active: Not on file   Other Topics Concern  . Not on file   Social History Narrative   2 daughters      No living will   Daughter Kennon Rounds has health care POA   Discussed DNR---he requests this ("I want to go in peace")   No tube feedings if cognitively unaware   Review of Systems Sleeps fairly well Voids okay---still  slow though not major change    Objective:   Physical Exam  Constitutional: He appears well-developed and well-nourished. No distress.  Neck: Normal range of motion. Neck supple. No thyromegaly present.  Cardiovascular: Normal rate, regular rhythm and normal heart sounds.  Exam reveals no gallop.   No murmur heard. Pulmonary/Chest: Effort normal and breath sounds normal. No respiratory distress. He has no wheezes. He has no rales.  Abdominal: Soft. There is no tenderness.  Musculoskeletal: He exhibits no edema and no tenderness.  Lymphadenopathy:    He has no cervical adenopathy.  Skin:  Actinic on inside top of right ear  Psychiatric:  Normal appearance and interaction          Assessment & Plan:

## 2012-07-22 NOTE — Assessment & Plan Note (Signed)
BP Readings from Last 3 Encounters:  07/22/12 142/66  04/29/12 110/60  01/29/12 100/60   Reasonable control No meds indicated

## 2012-07-22 NOTE — Assessment & Plan Note (Signed)
Quiet on his meds

## 2012-07-22 NOTE — Assessment & Plan Note (Signed)
Chronic but seems controlled on citalopram and lorazepam Wean is not indicated at this time

## 2012-07-29 ENCOUNTER — Other Ambulatory Visit: Payer: Self-pay | Admitting: Internal Medicine

## 2012-07-30 ENCOUNTER — Other Ambulatory Visit: Payer: Self-pay | Admitting: *Deleted

## 2012-07-30 MED ORDER — LORAZEPAM 0.5 MG PO TABS: 0.2500 mg | ORAL_TABLET | Freq: Three times a day (TID) | ORAL | Status: DC

## 2012-07-30 NOTE — Telephone Encounter (Signed)
Okay for a year 

## 2012-07-30 NOTE — Telephone Encounter (Signed)
Last filled 06/24/12 

## 2012-07-30 NOTE — Telephone Encounter (Signed)
Okay #60 x 5 Lives in [place] at [facility] and meds monitored

## 2012-07-30 NOTE — Telephone Encounter (Signed)
rx sent to pharmacy by e-script  

## 2012-07-30 NOTE — Telephone Encounter (Signed)
rx called into pharmacy

## 2012-08-05 ENCOUNTER — Other Ambulatory Visit: Payer: Self-pay | Admitting: *Deleted

## 2012-08-05 ENCOUNTER — Other Ambulatory Visit: Payer: Self-pay | Admitting: Internal Medicine

## 2012-08-05 MED ORDER — NITROGLYCERIN 0.4 MG SL SUBL: 0.4000 mg | SUBLINGUAL_TABLET | SUBLINGUAL | Status: AC | PRN

## 2012-08-06 NOTE — Telephone Encounter (Signed)
rx sent to pharmacy by e-script  

## 2012-08-06 NOTE — Telephone Encounter (Signed)
Okay to do both for a year

## 2012-08-12 ENCOUNTER — Other Ambulatory Visit: Payer: Self-pay | Admitting: Internal Medicine

## 2012-08-20 ENCOUNTER — Other Ambulatory Visit: Payer: Self-pay | Admitting: *Deleted

## 2012-08-20 MED ORDER — ASPIRIN 81 MG PO CHEW: 81.0000 mg | CHEWABLE_TABLET | Freq: Every day | ORAL | Status: DC

## 2012-09-02 ENCOUNTER — Non-Acute Institutional Stay: Payer: Medicare Other | Admitting: Internal Medicine

## 2012-09-02 ENCOUNTER — Encounter: Payer: Self-pay | Admitting: Internal Medicine

## 2012-09-02 VITALS — BP 104/50 | HR 62 | Temp 97.7°F | Resp 16 | Wt 143.0 lb

## 2012-09-02 DIAGNOSIS — L989 Disorder of the skin and subcutaneous tissue, unspecified: Secondary | ICD-10-CM | POA: Diagnosis not present

## 2012-09-02 NOTE — Assessment & Plan Note (Signed)
Looks irritative from his glasses Better with udder cream Reassured him --not pre cancerous  Buttock spot is stable and not an ulcer

## 2012-09-02 NOTE — Progress Notes (Signed)
Subjective:    Patient ID: Dustin Byrd, male    DOB: 04/17/1918, 79 y.o.   MRN: [id number]  HPI Concerned about spot behind his left ear Irritated by glasses Using udder cream and seems somewhat better  Right ear lesion did heal well  Also with chronic spot on right buttock Not open Uses the end-it still  Current Outpatient Prescriptions on File Prior to Visit  Medication Sig Dispense Refill  . allopurinol (ZYLOPRIM) 100 MG tablet TAKE 1 TABLET BY MOUTH ONCE DAILY FOR GOUT  30 tablet  11  . aspirin (ASPIRIN CHILDRENS) 81 MG chewable tablet Chew 1 tablet (81 mg total) by mouth daily.  36 tablet  5  . citalopram (CELEXA) 10 MG tablet Take 10 mg by mouth daily.      Marland Kitchen COLCRYS 0.6 MG tablet TAKE 1 TABLET BY MOUTH ONCE DAILY FOR GOUT  30 tablet  11  . Dermatological Products, Misc. (ITCH-ENDER!) LOTN APPLY TO ULCER AREA 3 TIMES A DAY AS DIRECTED.  120 mL  5  . loratadine (CLARITIN) 10 MG tablet Take 10 mg by mouth daily as needed for allergies. For secretions      . LORazepam (ATIVAN) 0.5 MG tablet Take 0.5 tablets (0.25 mg total) by mouth 3 (three) times daily. And 0.25 tid prn  60 tablet  5  . mirtazapine (REMERON) 15 MG tablet TAKE 1 TAB PO AT BEDTIME. (DEPRESSION/APPETITE)  30 tablet  11  . nitroGLYCERIN (NITROSTAT) 0.4 MG SL tablet Place 1 tablet (0.4 mg total) under the tongue every 5 (five) minutes as needed. For chest pain. Call 911 after 3 doses if no relief.  25 tablet  1  . omeprazole (PRILOSEC) 20 MG capsule Take 20 mg by mouth daily.      Marland Kitchen PENTASA 250 MG CR capsule TAKE 1 CAPSULE BY MOUTH FOUR TIMES A DAY FOR ULCERATIVE COLITIS.  120 capsule  11  . polyethylene glycol (MIRALAX / GLYCOLAX) packet Take 17 g by mouth 2 (two) times daily.       . sennosides-docusate sodium (SENOKOT-S) 8.6-50 MG tablet TAKE 2 TABLETS BY MOUTH TWICE A DAY FOR STOOL SOFTENER. TAKE WITH GLASS OF WATER.  120 tablet  11   No current facility-administered medications on file prior to visit.     Allergies  Allergen Reactions  . Aricept [Donepezil Hydrochloride]     agitation  . Cortisone   . Prednisone     Mania     Past Medical History  Diagnosis Date  . Gout   . CAD (coronary artery disease)   . DJD of shoulder   . UC (ulcerative colitis)   . Asbestos exposure   . Hypercholesterolemia   . Esophageal stricture   . Orthostatic hypotension   . Personal history of colonic polyps 1991    villous adenoma of cecum  . Memory loss   . Benign hypertensive kidney disease with chronic kidney disease stage I through stage IV, or unspecified(403.10)   . Chronic kidney disease, stage III (moderate)   . Encounter for long-term (current) use of other medications   . Diverticulosis of colon (without mention of hemorrhage)   . Anxiety   . Colon cancer 1994    Past Surgical History  Procedure Laterality Date  . Hemicolectomy  1991    right  . Colostomy takedown  1996  . Open heart surgery      CABG x4  . Coronary artery bypass graft      x4  Family History  Problem Relation Age of Onset  . Diabetes Father   . Bipolar disorder Daughter   . Stroke Mother     History   Social History  . Marital Status: Widowed    Spouse Name: N/A    Number of Children: 2  . Years of Education: N/A   Occupational History  . Textiles--various positions     Retired   Social History Main Topics  . Smoking status: Former Games developer  . Smokeless tobacco: Never Used  . Alcohol Use: Yes     Comment: 1-2 oz Scotch per night  . Drug Use: No  . Sexual Activity: Not on file   Other Topics Concern  . Not on file   Social History Narrative   2 daughters      No living will   Daughter Kennon Rounds has health care POA   Discussed DNR---he requests this ("I want to go in peace")   No tube feedings if cognitively unaware   Review of Systems Eating okay Weight reasonably stable     Objective:   Physical Exam  Constitutional: He appears well-developed and well-nourished. No  distress.  Skin:  Small red area in left postoccipital area Not inflamed or open  Small raised white tough spot on convex left buttock cheek No open areas          Assessment & Plan:

## 2012-09-16 ENCOUNTER — Other Ambulatory Visit: Payer: Self-pay | Admitting: *Deleted

## 2012-09-16 MED ORDER — OMEPRAZOLE 20 MG PO CPDR: 20.0000 mg | DELAYED_RELEASE_CAPSULE | Freq: Every day | ORAL | Status: DC

## 2012-09-23 ENCOUNTER — Non-Acute Institutional Stay: Payer: Medicare Other | Admitting: Internal Medicine

## 2012-09-23 ENCOUNTER — Encounter: Payer: Self-pay | Admitting: Internal Medicine

## 2012-09-23 VITALS — BP 108/54 | HR 56 | Temp 98.1°F | Resp 16 | Wt 142.0 lb

## 2012-09-23 DIAGNOSIS — F0390 Unspecified dementia without behavioral disturbance: Secondary | ICD-10-CM | POA: Diagnosis not present

## 2012-09-23 DIAGNOSIS — I10 Essential (primary) hypertension: Secondary | ICD-10-CM

## 2012-09-23 DIAGNOSIS — R634 Abnormal weight loss: Secondary | ICD-10-CM | POA: Diagnosis not present

## 2012-09-23 DIAGNOSIS — F411 Generalized anxiety disorder: Secondary | ICD-10-CM | POA: Diagnosis not present

## 2012-09-23 DIAGNOSIS — K5190 Ulcerative colitis, unspecified, without complications: Secondary | ICD-10-CM | POA: Diagnosis not present

## 2012-09-23 DIAGNOSIS — F419 Anxiety disorder, unspecified: Secondary | ICD-10-CM

## 2012-09-23 NOTE — Addendum Note (Signed)
Addended by: Tillman Abide I on: 09/23/2012 10:24 AM   Modules accepted: Orders

## 2012-09-23 NOTE — Progress Notes (Signed)
Subjective:    Patient ID: Dustin Byrd, male    DOB: 1918-10-31, 79 y.o.   MRN: [id number]  HPI Reviewed status with Angelica Chessman RN  He feels he is doing okay Has lost another pound over 2 months Still not eating well He still relates this to his upper teeth problems Inconsistent with supplements Continues on the mirtazapine  Sleeps well  No chest pain No SOB No edema  Anxiety has been okay Has the lorazepam for prn Always a "high strung guy" Not depressed now  Current Outpatient Prescriptions on File Prior to Visit  Medication Sig Dispense Refill  . allopurinol (ZYLOPRIM) 100 MG tablet TAKE 1 TABLET BY MOUTH ONCE DAILY FOR GOUT  30 tablet  11  . aspirin (ASPIRIN CHILDRENS) 81 MG chewable tablet Chew 1 tablet (81 mg total) by mouth daily.  36 tablet  5  . citalopram (CELEXA) 10 MG tablet Take 10 mg by mouth daily.      Marland Kitchen COLCRYS 0.6 MG tablet TAKE 1 TABLET BY MOUTH ONCE DAILY FOR GOUT  30 tablet  11  . Dermatological Products, Misc. (ITCH-ENDER!) LOTN APPLY TO ULCER AREA 3 TIMES A DAY AS DIRECTED.  120 mL  5  . loratadine (CLARITIN) 10 MG tablet Take 10 mg by mouth daily as needed for allergies. For secretions      . LORazepam (ATIVAN) 0.5 MG tablet Take 0.5 tablets (0.25 mg total) by mouth 3 (three) times daily. And 0.25 tid prn  60 tablet  5  . mirtazapine (REMERON) 15 MG tablet TAKE 1 TAB PO AT BEDTIME. (DEPRESSION/APPETITE)  30 tablet  11  . nitroGLYCERIN (NITROSTAT) 0.4 MG SL tablet Place 1 tablet (0.4 mg total) under the tongue every 5 (five) minutes as needed. For chest pain. Call 911 after 3 doses if no relief.  25 tablet  1  . omeprazole (PRILOSEC) 20 MG capsule Take 1 capsule (20 mg total) by mouth daily.  30 capsule  6  . PENTASA 250 MG CR capsule TAKE 1 CAPSULE BY MOUTH FOUR TIMES A DAY FOR ULCERATIVE COLITIS.  120 capsule  11  . polyethylene glycol (MIRALAX / GLYCOLAX) packet Take 17 g by mouth 2 (two) times daily.       . sennosides-docusate sodium (SENOKOT-S) 8.6-50  MG tablet TAKE 2 TABLETS BY MOUTH TWICE A DAY FOR STOOL SOFTENER. TAKE WITH GLASS OF WATER.  120 tablet  11   No current facility-administered medications on file prior to visit.    Allergies  Allergen Reactions  . Aricept [Donepezil Hydrochloride]     agitation  . Cortisone   . Prednisone     Mania     Past Medical History  Diagnosis Date  . Gout   . CAD (coronary artery disease)   . DJD of shoulder   . UC (ulcerative colitis)   . Asbestos exposure   . Hypercholesterolemia   . Esophageal stricture   . Orthostatic hypotension   . Personal history of colonic polyps 1991    villous adenoma of cecum  . Memory loss   . Benign hypertensive kidney disease with chronic kidney disease stage I through stage IV, or unspecified(403.10)   . Chronic kidney disease, stage III (moderate)   . Encounter for long-term (current) use of other medications   . Diverticulosis of colon (without mention of hemorrhage)   . Anxiety   . Colon cancer 1994    Past Surgical History  Procedure Laterality Date  . Hemicolectomy  1991  right  . Colostomy takedown  1996  . Open heart surgery      CABG x4  . Coronary artery bypass graft      x4    Family History  Problem Relation Age of Onset  . Diabetes Father   . Bipolar disorder Daughter   . Stroke Mother     History   Social History  . Marital Status: Widowed    Spouse Name: N/A    Number of Children: 2  . Years of Education: N/A   Occupational History  . Textiles--various positions     Retired   Social History Main Topics  . Smoking status: Former Games developer  . Smokeless tobacco: Never Used  . Alcohol Use: Yes     Comment: 1-2 oz Scotch per night  . Drug Use: No  . Sexual Activity: Not on file   Other Topics Concern  . Not on file   Social History Narrative   2 daughters      No living will   Daughter Kennon Rounds has health care POA   Discussed DNR---he requests this ("I want to go in peace")   No tube feedings if  cognitively unaware   Review of Systems Has a "twitch" of his left>right leg---just kicks out (occ hand also) Bowels have been better lately Voids okay--does have urgency. Wears depends for protection Still has some numbness in lateral hands-- L>R. No progression    Objective:   Physical Exam  Constitutional: He appears well-developed. No distress.  Neck: Normal range of motion. Neck supple. No thyromegaly present.  Cardiovascular: Normal rate, regular rhythm and normal heart sounds.  Exam reveals no gallop.   No murmur heard. Occasional skips  Pulmonary/Chest: Effort normal and breath sounds normal. No respiratory distress. He has no wheezes. He has no rales.  Abdominal: Soft. There is no tenderness.  Musculoskeletal: He exhibits no edema and no tenderness.  Lymphadenopathy:    He has no cervical adenopathy.  Psychiatric: He has a normal mood and affect. His behavior is normal.          Assessment & Plan:

## 2012-09-23 NOTE — Assessment & Plan Note (Signed)
Mild and stable Probably vascular since no progression

## 2012-09-23 NOTE — Assessment & Plan Note (Signed)
Seems to be controlled Doesn't seem to be related to the weight loss

## 2012-09-23 NOTE — Assessment & Plan Note (Signed)
BP Readings from Last 3 Encounters:  09/23/12 108/54  09/02/12 104/50  07/22/12 142/66   Good control Off meds

## 2012-09-23 NOTE — Assessment & Plan Note (Signed)
Still mild  Better than in past Seems to have accepted his decreased independence better

## 2012-09-23 NOTE — Assessment & Plan Note (Signed)
Slow but persistent  Relates to his teeth but appetite not great overall Will try increasing mirtazapine

## 2012-10-19 ENCOUNTER — Other Ambulatory Visit: Payer: Self-pay | Admitting: *Deleted

## 2012-10-19 MED ORDER — POLYETHYLENE GLYCOL 3350 17 G PO PACK: 17.0000 g | PACK | Freq: Two times a day (BID) | ORAL | Status: AC

## 2012-11-04 DIAGNOSIS — Z23 Encounter for immunization: Secondary | ICD-10-CM | POA: Diagnosis not present

## 2012-12-23 ENCOUNTER — Encounter: Payer: Self-pay | Admitting: Internal Medicine

## 2012-12-23 ENCOUNTER — Non-Acute Institutional Stay: Payer: Medicare Other | Admitting: Internal Medicine

## 2012-12-23 VITALS — BP 122/60 | HR 67 | Temp 97.1°F | Resp 12 | Wt 145.0 lb

## 2012-12-23 DIAGNOSIS — K5190 Ulcerative colitis, unspecified, without complications: Secondary | ICD-10-CM

## 2012-12-23 DIAGNOSIS — I2510 Atherosclerotic heart disease of native coronary artery without angina pectoris: Secondary | ICD-10-CM

## 2012-12-23 DIAGNOSIS — F0390 Unspecified dementia without behavioral disturbance: Secondary | ICD-10-CM

## 2012-12-23 DIAGNOSIS — D696 Thrombocytopenia, unspecified: Secondary | ICD-10-CM

## 2012-12-23 DIAGNOSIS — L259 Unspecified contact dermatitis, unspecified cause: Secondary | ICD-10-CM | POA: Diagnosis not present

## 2012-12-23 DIAGNOSIS — I10 Essential (primary) hypertension: Secondary | ICD-10-CM

## 2012-12-23 DIAGNOSIS — F39 Unspecified mood [affective] disorder: Secondary | ICD-10-CM

## 2012-12-23 DIAGNOSIS — L249 Irritant contact dermatitis, unspecified cause: Secondary | ICD-10-CM | POA: Insufficient documentation

## 2012-12-23 DIAGNOSIS — M109 Gout, unspecified: Secondary | ICD-10-CM

## 2012-12-23 NOTE — Assessment & Plan Note (Signed)
No symptoms Does bruise easy---this is most likely from the aspirin though

## 2012-12-23 NOTE — Assessment & Plan Note (Signed)
Mostly anxiety Some dysthymia at times Stable on citalopram On mirtazapine at bedtime for sleep and to stimulate appetite

## 2012-12-23 NOTE — Assessment & Plan Note (Signed)
Quiet on his meds

## 2012-12-23 NOTE — Assessment & Plan Note (Signed)
Stable mild dementia Still handles ADLs and maintains judgement and some insight

## 2012-12-23 NOTE — Progress Notes (Signed)
Subjective:    Patient ID: Dustin Byrd, male    DOB: [birth year], 79 y.o.   MRN: [id number]  HPI Reviewed status with Dustin Chessman RN He continues to be anxious Worried that family will be out of town over holidays  Ongoing sores on buttocks Cream helps some but doesn't get rid of them This causes him anxiety  Appetite is better Has gained 3# since my last visit  No chest pain No SOB No sig edema He walks regularly---no change in his tolerance No dizziness or syncope  Still manages in assisted living No change in handling ADLs Ongoing memory problems but no apparent progression  Current Outpatient Prescriptions on File Prior to Visit  Medication Sig Dispense Refill  . allopurinol (ZYLOPRIM) 100 MG tablet TAKE 1 TABLET BY MOUTH ONCE DAILY FOR GOUT  30 tablet  11  . aspirin (ASPIRIN CHILDRENS) 81 MG chewable tablet Chew 1 tablet (81 mg total) by mouth daily.  36 tablet  5  . citalopram (CELEXA) 10 MG tablet Take 10 mg by mouth daily.      Marland Kitchen COLCRYS 0.6 MG tablet TAKE 1 TABLET BY MOUTH ONCE DAILY FOR GOUT  30 tablet  11  . Dermatological Products, Misc. (ITCH-ENDER!) LOTN APPLY TO ULCER AREA 3 TIMES A DAY AS DIRECTED.  120 mL  5  . loratadine (CLARITIN) 10 MG tablet Take 10 mg by mouth daily as needed for allergies. For secretions      . LORazepam (ATIVAN) 0.5 MG tablet Take 0.5 tablets (0.25 mg total) by mouth 3 (three) times daily. And 0.25 tid prn  60 tablet  5  . mirtazapine (REMERON) 15 MG tablet Take 30 mg by mouth at bedtime.       . nitroGLYCERIN (NITROSTAT) 0.4 MG SL tablet Place 1 tablet (0.4 mg total) under the tongue every 5 (five) minutes as needed. For chest pain. Call 911 after 3 doses if no relief.  25 tablet  1  . omeprazole (PRILOSEC) 20 MG capsule Take 1 capsule (20 mg total) by mouth daily.  30 capsule  6  . PENTASA 250 MG CR capsule TAKE 1 CAPSULE BY MOUTH FOUR TIMES A DAY FOR ULCERATIVE COLITIS.  120 capsule  11  . polyethylene glycol (MIRALAX / GLYCOLAX) packet  Take 17 g by mouth 2 (two) times daily.  200 each  3  . sennosides-docusate sodium (SENOKOT-S) 8.6-50 MG tablet TAKE 2 TABLETS BY MOUTH TWICE A DAY FOR STOOL SOFTENER. TAKE WITH GLASS OF WATER.  120 tablet  11   No current facility-administered medications on file prior to visit.    Allergies  Allergen Reactions  . Aricept [Donepezil Hydrochloride]     agitation  . Cortisone   . Prednisone     Mania     Past Medical History  Diagnosis Date  . Gout   . CAD (coronary artery disease)   . DJD of shoulder   . UC (ulcerative colitis)   . Asbestos exposure   . Hypercholesterolemia   . Esophageal stricture   . Orthostatic hypotension   . Personal history of colonic polyps 1991    villous adenoma of cecum  . Memory loss   . Benign hypertensive kidney disease with chronic kidney disease stage I through stage IV, or unspecified(403.10)   . Chronic kidney disease, stage III (moderate)   . Encounter for long-term (current) use of other medications   . Diverticulosis of colon (without mention of hemorrhage)   . Anxiety   .  Colon cancer 1994    Past Surgical History  Procedure Laterality Date  . Hemicolectomy  1991    right  . Colostomy takedown  1996  . Open heart surgery      CABG x4  . Coronary artery bypass graft      x4    Family History  Problem Relation Age of Onset  . Diabetes Father   . Bipolar disorder Daughter   . Stroke Mother     History   Social History  . Marital Status: Widowed    Spouse Name: N/A    Number of Children: 2  . Years of Education: N/A   Occupational History  . Textiles--various positions     Retired   Social History Main Topics  . Smoking status: Former Games developer  . Smokeless tobacco: Never Used  . Alcohol Use: Yes     Comment: 1-2 oz Scotch per night  . Drug Use: No  . Sexual Activity: Not on file   Other Topics Concern  . Not on file   Social History Narrative   2 daughters      No living will   Daughter Dustin Byrd has health  care POA   Discussed DNR---he requests this ("I want to go in peace")   No tube feedings if cognitively unaware   Review of Systems Bowels have been okay--no pain or blood Thick saliva is still an issue--but he feels it is some better    Objective:   Physical Exam  Constitutional: He appears well-developed. No distress.  Neck: Normal range of motion. Neck supple. No thyromegaly present.  Cardiovascular: Normal rate, regular rhythm and normal heart sounds.  Exam reveals no gallop.   No murmur heard. Pulmonary/Chest: Effort normal and breath sounds normal. No respiratory distress. He has no wheezes. He has no rales.  Abdominal: Soft. There is no tenderness.  Musculoskeletal: He exhibits no edema and no tenderness.  Lymphadenopathy:    He has no cervical adenopathy.  Skin:  Mildly inflamed and scaling areas along both medial buttocks. Several small open areas  Psychiatric: He has a normal mood and affect. His behavior is normal.          Assessment & Plan:

## 2012-12-23 NOTE — Assessment & Plan Note (Signed)
Will try hydrocortisone cream and zinc oxide/dermacloud nightly by staff He can still apply his cream in AM

## 2012-12-23 NOTE — Assessment & Plan Note (Signed)
Controlled with the pentasa

## 2012-12-23 NOTE — Assessment & Plan Note (Signed)
BP Readings from Last 3 Encounters:  12/23/12 122/60  09/23/12 108/54  09/02/12 104/50   Good control without meds

## 2012-12-23 NOTE — Assessment & Plan Note (Signed)
Has been quiet Just on the aspirin Has NTG but hasn't needed

## 2013-03-31 ENCOUNTER — Encounter: Payer: Self-pay | Admitting: Internal Medicine

## 2013-03-31 ENCOUNTER — Non-Acute Institutional Stay: Payer: Medicare Other | Admitting: Internal Medicine

## 2013-03-31 VITALS — BP 130/64 | HR 58 | Temp 98.4°F | Resp 14 | Wt 147.0 lb

## 2013-03-31 DIAGNOSIS — F0390 Unspecified dementia without behavioral disturbance: Secondary | ICD-10-CM

## 2013-03-31 DIAGNOSIS — I2510 Atherosclerotic heart disease of native coronary artery without angina pectoris: Secondary | ICD-10-CM | POA: Diagnosis not present

## 2013-03-31 DIAGNOSIS — R634 Abnormal weight loss: Secondary | ICD-10-CM

## 2013-03-31 DIAGNOSIS — K5190 Ulcerative colitis, unspecified, without complications: Secondary | ICD-10-CM | POA: Diagnosis not present

## 2013-03-31 DIAGNOSIS — M109 Gout, unspecified: Secondary | ICD-10-CM

## 2013-03-31 DIAGNOSIS — D696 Thrombocytopenia, unspecified: Secondary | ICD-10-CM

## 2013-03-31 DIAGNOSIS — F39 Unspecified mood [affective] disorder: Secondary | ICD-10-CM

## 2013-03-31 DIAGNOSIS — I10 Essential (primary) hypertension: Secondary | ICD-10-CM

## 2013-03-31 NOTE — Assessment & Plan Note (Signed)
Continues to gain weight on mirtazapine Will continue

## 2013-03-31 NOTE — Progress Notes (Signed)
Subjective:    Patient ID: Dustin Byrd, male    DOB: March 05, 1918, 80 y.o.   MRN: [id number]  HPI Reviewed status with Leafy Ro RN No new concerns  "Mentally I am strapped" No apparent progression per staff Still fairly independent with ADLs   Bowels ares slow Responds to his meds No abdominal pain No blood in stool  Appetite is good Weight up another couple of pounds  He is generally satisfied here No depression  No chest pain  Hasn't needed ntg Breathing is fine No dizziness or syncope Walks well with rolling walker No falls  Current Outpatient Prescriptions on File Prior to Visit  Medication Sig Dispense Refill  . allopurinol (ZYLOPRIM) 100 MG tablet TAKE 1 TABLET BY MOUTH ONCE DAILY FOR GOUT  30 tablet  11  . aspirin (ASPIRIN CHILDRENS) 81 MG chewable tablet Chew 1 tablet (81 mg total) by mouth daily.  36 tablet  5  . citalopram (CELEXA) 10 MG tablet Take 10 mg by mouth daily.      Marland Kitchen COLCRYS 0.6 MG tablet TAKE 1 TABLET BY MOUTH ONCE DAILY FOR GOUT  30 tablet  11  . Dermatological Products, Misc. (ITCH-ENDER!) LOTN APPLY TO ULCER AREA 3 TIMES A DAY AS DIRECTED.  120 mL  5  . loratadine (CLARITIN) 10 MG tablet Take 10 mg by mouth daily as needed. For secretions      . LORazepam (ATIVAN) 0.5 MG tablet Take 0.5 tablets (0.25 mg total) by mouth 3 (three) times daily. And 0.25 tid prn  60 tablet  5  . mirtazapine (REMERON) 15 MG tablet Take 30 mg by mouth at bedtime.       . nitroGLYCERIN (NITROSTAT) 0.4 MG SL tablet Place 1 tablet (0.4 mg total) under the tongue every 5 (five) minutes as needed. For chest pain. Call 911 after 3 doses if no relief.  25 tablet  1  . omeprazole (PRILOSEC) 20 MG capsule Take 1 capsule (20 mg total) by mouth daily.  30 capsule  6  . PENTASA 250 MG CR capsule TAKE 1 CAPSULE BY MOUTH FOUR TIMES A DAY FOR ULCERATIVE COLITIS.  120 capsule  11  . polyethylene glycol (MIRALAX / GLYCOLAX) packet Take 17 g by mouth 2 (two) times daily.  200 each  3  .  sennosides-docusate sodium (SENOKOT-S) 8.6-50 MG tablet TAKE 2 TABLETS BY MOUTH TWICE A DAY FOR STOOL SOFTENER. TAKE WITH GLASS OF WATER.  120 tablet  11   No current facility-administered medications on file prior to visit.    Allergies  Allergen Reactions  . Aricept [Donepezil Hydrochloride]     agitation  . Cortisone   . Prednisone     Mania     Past Medical History  Diagnosis Date  . Gout   . CAD (coronary artery disease)   . DJD of shoulder   . UC (ulcerative colitis)   . Asbestos exposure   . Hypercholesterolemia   . Esophageal stricture   . Orthostatic hypotension   . Personal history of colonic polyps 1991    villous adenoma of cecum  . Memory loss   . 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 Chronic kidney disease, stage III (moderate)   . Encounter for long-term (current) use of other medications   . Diverticulosis of colon (without mention of hemorrhage)   . Anxiety   . Colon cancer 1994    Past Surgical History  Procedure Laterality Date  . Hemicolectomy  1991    right  . Colostomy takedown  1996  . Open heart surgery      CABG x4  . Coronary artery bypass graft      x4    Family History  Problem Relation Age of Onset  . Diabetes Father   . Bipolar disorder Daughter   . Stroke Mother     History   Social History  . Marital Status: Widowed    Spouse Name: N/A    Number of Children: 2  . Years of Education: N/A   Occupational History  . Textiles--various positions     Retired   Social History Main Topics  . Smoking status: Former Research scientist (life sciences)  . Smokeless tobacco: Never Used  . Alcohol Use: Yes     Comment: 1-2 oz Scotch per night  . Drug Use: No  . Sexual Activity: Not on file   Other Topics Concern  . Not on file   Social History Narrative   2 daughters      No living will   Daughter Gay Filler has health care POA   Discussed DNR---he requests this ("I want to go in  peace")   No tube feedings if cognitively unaware   Review of Systems Still poor hearing Sleeps very well Gets intermittent joint pains Now wearing protective undergarments    Objective:   Physical Exam  Constitutional: He appears well-developed and well-nourished. No distress.  Neck: Normal range of motion. Neck supple. No thyromegaly present.  Cardiovascular: Normal rate, regular rhythm and normal heart sounds.  Exam reveals no gallop.   No murmur heard. Pulmonary/Chest: Effort normal and breath sounds normal. No respiratory distress. He has no wheezes. He has no rales.  Abdominal: Soft. There is no tenderness.  Musculoskeletal: He exhibits no edema and no tenderness.  Lymphadenopathy:    He has no cervical adenopathy.  Skin:  Left buttock ulcer is healed Linear scar tissue present---slight induration and redness persist  Psychiatric: He has a normal mood and affect. His behavior is normal.          Assessment & Plan:

## 2013-03-31 NOTE — Assessment & Plan Note (Signed)
Mild and doesn't appear to be progressive Probably vascular No change in function---still doing well in AL

## 2013-03-31 NOTE — Assessment & Plan Note (Signed)
No recent problems with chest pain Hasn't needed nitro

## 2013-03-31 NOTE — Assessment & Plan Note (Signed)
Quiet on pentasa Will continue

## 2013-03-31 NOTE — Assessment & Plan Note (Signed)
Has been quiet 

## 2013-03-31 NOTE — Assessment & Plan Note (Signed)
BP Readings from Last 3 Encounters:  03/31/13 130/64  12/23/12 122/60  09/23/12 108/54   Good control

## 2013-03-31 NOTE — Assessment & Plan Note (Signed)
Has been doing well lately Satisfied here

## 2013-03-31 NOTE — Assessment & Plan Note (Signed)
No bleeding No action needed

## 2013-04-01 ENCOUNTER — Telehealth: Payer: Self-pay | Admitting: Internal Medicine

## 2013-04-01 NOTE — Telephone Encounter (Signed)
Relevant patient education mailed to patient.  

## 2013-06-07 ENCOUNTER — Other Ambulatory Visit: Payer: Self-pay | Admitting: *Deleted

## 2013-06-07 MED ORDER — LORAZEPAM 0.5 MG PO TABS: 0.2500 mg | ORAL_TABLET | Freq: Three times a day (TID) | ORAL | Status: DC

## 2013-06-07 NOTE — Telephone Encounter (Signed)
Okay #90 x 5

## 2013-06-07 NOTE — Telephone Encounter (Signed)
rx faxed to pharmacy manually  

## 2013-06-07 NOTE — Telephone Encounter (Signed)
Form on your desk  

## 2013-06-13 ENCOUNTER — Encounter: Payer: Self-pay | Admitting: Internal Medicine

## 2013-06-13 DIAGNOSIS — M109 Gout, unspecified: Secondary | ICD-10-CM | POA: Diagnosis not present

## 2013-06-13 DIAGNOSIS — Z79899 Other long term (current) drug therapy: Secondary | ICD-10-CM | POA: Diagnosis not present

## 2013-06-23 DIAGNOSIS — M542 Cervicalgia: Secondary | ICD-10-CM | POA: Diagnosis not present

## 2013-06-23 DIAGNOSIS — M545 Low back pain, unspecified: Secondary | ICD-10-CM | POA: Diagnosis not present

## 2013-06-23 DIAGNOSIS — R079 Chest pain, unspecified: Secondary | ICD-10-CM | POA: Diagnosis not present

## 2013-06-23 DIAGNOSIS — M546 Pain in thoracic spine: Secondary | ICD-10-CM | POA: Diagnosis not present

## 2013-06-30 ENCOUNTER — Non-Acute Institutional Stay: Payer: Medicare Other | Admitting: Internal Medicine

## 2013-06-30 ENCOUNTER — Encounter: Payer: Self-pay | Admitting: Internal Medicine

## 2013-06-30 VITALS — BP 100/58 | HR 78 | Temp 98.5°F | Resp 20 | Wt 147.0 lb

## 2013-06-30 DIAGNOSIS — I2510 Atherosclerotic heart disease of native coronary artery without angina pectoris: Secondary | ICD-10-CM | POA: Diagnosis not present

## 2013-06-30 DIAGNOSIS — D696 Thrombocytopenia, unspecified: Secondary | ICD-10-CM | POA: Diagnosis not present

## 2013-06-30 DIAGNOSIS — I10 Essential (primary) hypertension: Secondary | ICD-10-CM

## 2013-06-30 DIAGNOSIS — K5190 Ulcerative colitis, unspecified, without complications: Secondary | ICD-10-CM

## 2013-06-30 DIAGNOSIS — F39 Unspecified mood [affective] disorder: Secondary | ICD-10-CM

## 2013-06-30 DIAGNOSIS — F0390 Unspecified dementia without behavioral disturbance: Secondary | ICD-10-CM

## 2013-06-30 NOTE — Assessment & Plan Note (Signed)
Mild and probably vascular  Doing okay in AL still Recent falls may be due to decreased safety awareness---will consult PT

## 2013-06-30 NOTE — Progress Notes (Signed)
Subjective:    Patient ID: Dustin Byrd, male    DOB: 18-Jan-1918, 80 y.o.   MRN: [id number]  HPI Has had 2 recent falls--no major injury Ottoman removed--though he is unclear whether this was related He feels there is "something wrong with my legs". Right>left knee stiff and takes a while to loosen up Hasn't been consistent with using walker  No other acute issues per [facility] RN  Appetite is fine Weight stable No abdominal pain Bowels okay and no blood  Memory still seems stable No change in function--independent in personal care for the most part  No chest pain No SOB No dizziness or syncope No edema  No stomach problems No heartburn No swallowing problems  Mood has been okay No recent gout issues  Current Outpatient Prescriptions on File Prior to Visit  Medication Sig Dispense Refill  . allopurinol (ZYLOPRIM) 100 MG tablet TAKE 1 TABLET BY MOUTH ONCE DAILY FOR GOUT  30 tablet  11  . aspirin (ASPIRIN CHILDRENS) 81 MG chewable tablet Chew 1 tablet (81 mg total) by mouth daily.  36 tablet  5  . citalopram (CELEXA) 10 MG tablet Take 10 mg by mouth daily.      Marland Kitchen COLCRYS 0.6 MG tablet TAKE 1 TABLET BY MOUTH ONCE DAILY FOR GOUT  30 tablet  11  . Dermatological Products, Misc. (ITCH-ENDER!) LOTN APPLY TO ULCER AREA 3 TIMES A DAY AS DIRECTED.  120 mL  5  . loratadine (CLARITIN) 10 MG tablet Take 10 mg by mouth daily as needed. For secretions      . LORazepam (ATIVAN) 0.5 MG tablet Take 0.5 tablets (0.25 mg total) by mouth 3 (three) times daily. And 0.25 three times daily as needed  90 tablet  5  . mirtazapine (REMERON) 15 MG tablet Take 30 mg by mouth at bedtime.       . nitroGLYCERIN (NITROSTAT) 0.4 MG SL tablet Place 1 tablet (0.4 mg total) under the tongue every 5 (five) minutes as needed. For chest pain. Call 911 after 3 doses if no relief.  25 tablet  1  . omeprazole (PRILOSEC) 20 MG capsule Take 1 capsule (20 mg total) by mouth daily.  30 capsule  6  . PENTASA 250 MG CR  capsule TAKE 1 CAPSULE BY MOUTH FOUR TIMES A DAY FOR ULCERATIVE COLITIS.  120 capsule  11  . polyethylene glycol (MIRALAX / GLYCOLAX) packet Take 17 g by mouth 2 (two) times daily.  200 each  3  . sennosides-docusate sodium (SENOKOT-S) 8.6-50 MG tablet TAKE 2 TABLETS BY MOUTH TWICE A DAY FOR STOOL SOFTENER. TAKE WITH GLASS OF WATER.  120 tablet  11   No current facility-administered medications on file prior to visit.    Allergies  Allergen Reactions  . Aricept [Donepezil Hydrochloride]     agitation  . Cortisone   . Prednisone     Mania     Past Medical History  Diagnosis Date  . Gout   . CAD (coronary artery disease)   . DJD of shoulder   . UC (ulcerative colitis)   . Asbestos exposure   . Hypercholesterolemia   . Esophageal stricture   . Orthostatic hypotension   . Personal history of colonic polyps 1991    villous adenoma of cecum  . Memory loss   . 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 Chronic kidney disease, stage III (moderate)   . Encounter for  long-term (current) use of other medications   . Diverticulosis of colon (without mention of hemorrhage)   . Anxiety   . Colon cancer 1994    Past Surgical History  Procedure Laterality Date  . Hemicolectomy  1991    right  . Colostomy takedown  1996  . Open heart surgery      CABG x4  . Coronary artery bypass graft      x4    Family History  Problem Relation Age of Onset  . Diabetes Father   . Bipolar disorder Daughter   . Stroke Mother     History   Social History  . Marital Status: Widowed    Spouse Name: N/A    Number of Children: 2  . Years of Education: N/A   Occupational History  . Textiles--various positions     Retired   Social History Main Topics  . Smoking status: Former Research scientist (life sciences)  . Smokeless tobacco: Never Used  . Alcohol Use: Yes     Comment: 1-2 oz Scotch per night  . Drug Use: No  . Sexual Activity: Not on file    Other Topics Concern  . Not on file   Social History Narrative   2 daughters      No living will   Daughter Gay Filler has health care POA   Discussed DNR---he requests this ("I want to go in peace")   No tube feedings if cognitively unaware   Review of Systems He notes rough spots on forearms. No pain Sore on buttocks seems to be better Sleeps well. Falls asleep easy and will nap deeply (like when I came in today)    Objective:   Physical Exam  Constitutional: He appears well-developed and well-nourished. No distress.  Neck: Normal range of motion. Neck supple. No thyromegaly present.  Cardiovascular: Normal rate, regular rhythm and normal heart sounds.  Exam reveals no gallop.   No murmur heard. Pulmonary/Chest: Effort normal and breath sounds normal. No respiratory distress. He has no wheezes. He has no rales.  Abdominal: Soft. There is no tenderness.  Musculoskeletal: He exhibits no edema and no tenderness.  Lymphadenopathy:    He has no cervical adenopathy.  Neurological:  Movements are slow but he is independent up and down from chair and lowering pants, etc  Skin:  No buttock ulcer or induration now---just mild redness medially on both  Psychiatric: He has a normal mood and affect. His behavior is normal.          Assessment & Plan:

## 2013-06-30 NOTE — Assessment & Plan Note (Signed)
Quiet on the pentasa

## 2013-06-30 NOTE — Assessment & Plan Note (Signed)
Persists but mild No action needed

## 2013-06-30 NOTE — Assessment & Plan Note (Signed)
BP Readings from Last 3 Encounters:  06/30/13 100/58  03/31/13 130/64  12/23/12 122/60   Good control  No changes needed--hasn't needed Rx recently

## 2013-06-30 NOTE — Assessment & Plan Note (Signed)
Mostly anxiety Does okay with the low dose anxiolytic

## 2013-06-30 NOTE — Assessment & Plan Note (Signed)
Has not had recent angina and no need for NTG Stable functional status and no DOE

## 2013-08-09 DIAGNOSIS — H35369 Drusen (degenerative) of macula, unspecified eye: Secondary | ICD-10-CM | POA: Diagnosis not present

## 2013-08-09 DIAGNOSIS — H353290 Exudative age-related macular degeneration, unspecified eye, stage unspecified: Secondary | ICD-10-CM | POA: Diagnosis not present

## 2013-09-19 DIAGNOSIS — H35059 Retinal neovascularization, unspecified, unspecified eye: Secondary | ICD-10-CM | POA: Diagnosis not present

## 2013-09-19 DIAGNOSIS — H353290 Exudative age-related macular degeneration, unspecified eye, stage unspecified: Secondary | ICD-10-CM | POA: Diagnosis not present

## 2013-09-29 ENCOUNTER — Encounter: Payer: Self-pay | Admitting: Internal Medicine

## 2013-09-29 ENCOUNTER — Non-Acute Institutional Stay: Payer: Medicare Other | Admitting: Internal Medicine

## 2013-09-29 VITALS — BP 100/60 | HR 69 | Temp 97.5°F | Resp 14 | Wt 147.0 lb

## 2013-09-29 DIAGNOSIS — K5190 Ulcerative colitis, unspecified, without complications: Secondary | ICD-10-CM

## 2013-09-29 DIAGNOSIS — C679 Malignant neoplasm of bladder, unspecified: Secondary | ICD-10-CM

## 2013-09-29 DIAGNOSIS — I951 Orthostatic hypotension: Secondary | ICD-10-CM

## 2013-09-29 DIAGNOSIS — F39 Unspecified mood [affective] disorder: Secondary | ICD-10-CM

## 2013-09-29 DIAGNOSIS — F0390 Unspecified dementia without behavioral disturbance: Secondary | ICD-10-CM

## 2013-09-29 NOTE — Assessment & Plan Note (Signed)
No hematuria or evidence that this is active now He does have prostate symptoms--but will hold off on Rx for this

## 2013-09-29 NOTE — Assessment & Plan Note (Signed)
Still mild and doesn't appear to have progressed

## 2013-09-29 NOTE — Progress Notes (Signed)
Subjective:    Patient ID: Dustin Byrd, male    DOB: [birth year], 80 y.o.   MRN: [id number]  HPI Reviewed status with Leafy Ro RN He hasn't felt great He thinks he may be nearing the end Staff has noted orthostatic BP drops--- down to 84/50 standing but no pulse change No falls  Staff notes him sleeping a lot--hard to rouse at times He finds that he sleeps just looking at the TV Sleeps at night but interrupted by nocturia x 5----this is disturbing to him but is able to fall back to sleep Has urinary frequency in day also  Anxiety seems controlled He doesn't endorse current worrying but "I am concious of the end"  No chest pain No SOB No edema Doesn't have symptomatic orthostatic dizziness  No abdominal pain No bowel issues  Current Outpatient Prescriptions on File Prior to Visit  Medication Sig Dispense Refill  . allopurinol (ZYLOPRIM) 100 MG tablet TAKE 1 TABLET BY MOUTH ONCE DAILY FOR GOUT  30 tablet  11  . aspirin (ASPIRIN CHILDRENS) 81 MG chewable tablet Chew 1 tablet (81 mg total) by mouth daily.  36 tablet  5  . citalopram (CELEXA) 10 MG tablet Take 10 mg by mouth daily.      Marland Kitchen COLCRYS 0.6 MG tablet TAKE 1 TABLET BY MOUTH ONCE DAILY FOR GOUT  30 tablet  11  . Dermatological Products, Misc. (ITCH-ENDER!) LOTN APPLY TO ULCER AREA 3 TIMES A DAY AS DIRECTED.  120 mL  5  . fluticasone (FLONASE) 50 MCG/ACT nasal spray Place 2 sprays into both nostrils daily.      Marland Kitchen loratadine (CLARITIN) 10 MG tablet Take 10 mg by mouth daily as needed. For secretions      . LORazepam (ATIVAN) 0.5 MG tablet Take 0.5 tablets (0.25 mg total) by mouth 3 (three) times daily. And 0.25 three times daily as needed  90 tablet  5  . mirtazapine (REMERON) 15 MG tablet Take 30 mg by mouth at bedtime.       . nitroGLYCERIN (NITROSTAT) 0.4 MG SL tablet Place 1 tablet (0.4 mg total) under the tongue every 5 (five) minutes as needed. For chest pain. Call 911 after 3 doses if no relief.  25 tablet  1  .  omeprazole (PRILOSEC) 20 MG capsule Take 1 capsule (20 mg total) by mouth daily.  30 capsule  6  . PENTASA 250 MG CR capsule TAKE 1 CAPSULE BY MOUTH FOUR TIMES A DAY FOR ULCERATIVE COLITIS.  120 capsule  11  . polyethylene glycol (MIRALAX / GLYCOLAX) packet Take 17 g by mouth 2 (two) times daily.  200 each  3  . sennosides-docusate sodium (SENOKOT-S) 8.6-50 MG tablet TAKE 2 TABLETS BY MOUTH TWICE A DAY FOR STOOL SOFTENER. TAKE WITH GLASS OF WATER.  120 tablet  11   No current facility-administered medications on file prior to visit.    Allergies  Allergen Reactions  . Aricept [Donepezil Hydrochloride]     agitation  . Cortisone   . Prednisone     Mania     Past Medical History  Diagnosis Date  . Gout   . CAD (coronary artery disease)   . DJD of shoulder   . UC (ulcerative colitis)   . Asbestos exposure   . Hypercholesterolemia   . Esophageal stricture   . Orthostatic hypotension   . Personal history of colonic polyps 1991    villous adenoma of cecum  . Memory loss   . Benign hypertensive  kidney disease with chronic kidney disease stage I through stage IV, or unspecified   . Chronic kidney disease, stage III (moderate)   . Encounter for long-term (current) use of other medications   . Diverticulosis of colon (without mention of hemorrhage)   . Anxiety   . Colon cancer 1994    Past Surgical History  Procedure Laterality Date  . Hemicolectomy  1991    right  . Colostomy takedown  1996  . Open heart surgery      CABG x4  . Coronary artery bypass graft      x4    Family History  Problem Relation Age of Onset  . Diabetes Father   . Bipolar disorder Daughter   . Stroke Mother     History   Social History  . Marital Status: Widowed    Spouse Name: N/A    Number of Children: 2  . Years of Education: N/A   Occupational History  . Textiles--various positions     Retired   Social History Main Topics  . Smoking status: Former Research scientist (life sciences)  . Smokeless tobacco:  Never Used  . Alcohol Use: Yes     Comment: 1-2 oz Scotch per night  . Drug Use: No  . Sexual Activity: Not on file   Other Topics Concern  . Not on file   Social History Narrative   2 daughters      No living will   Daughter Gay Filler has health care POA   Discussed DNR---he requests this ("I want to go in peace")   No tube feedings if cognitively unaware   Review of Systems Appetite is okay Weight stable No skin issues    Objective:   Physical Exam  Constitutional: He appears well-developed and well-nourished. No distress.  Neck: Normal range of motion. Neck supple.  Cardiovascular: Normal rate, regular rhythm and normal heart sounds.  Exam reveals no gallop.   No murmur heard. Pulmonary/Chest: Effort normal and breath sounds normal. No respiratory distress. He has no wheezes. He has no rales.  Abdominal: Soft. There is no tenderness.  Musculoskeletal: He exhibits no edema and no tenderness.  Lymphadenopathy:    He has no cervical adenopathy.  Psychiatric: He has a normal mood and affect. His behavior is normal.  Seems at peace No anxious or depressed          Assessment & Plan:

## 2013-09-29 NOTE — Assessment & Plan Note (Signed)
Quiet on pentasa

## 2013-09-29 NOTE — Assessment & Plan Note (Signed)
Anxiety seems controlled with citalopram and lorazepam Will try off the mirtazapine I will also try to cut back on the lorazepam to just prn and see how he does

## 2013-09-29 NOTE — Addendum Note (Signed)
Addended by: Viviana Simpler I on: 09/29/2013 10:18 AM   Modules accepted: Orders

## 2013-09-29 NOTE — Assessment & Plan Note (Signed)
Long standing diagnosis but doesn't seem to fit criteria now and is not symptomatic Consider fludrocortisone if worsens Will stop the mirtazapine in case it is affecting him in day

## 2013-10-27 DIAGNOSIS — Z23 Encounter for immunization: Secondary | ICD-10-CM | POA: Diagnosis not present

## 2013-11-07 ENCOUNTER — Other Ambulatory Visit: Payer: Self-pay | Admitting: Internal Medicine

## 2013-11-10 ENCOUNTER — Telehealth: Payer: Self-pay | Admitting: Internal Medicine

## 2013-11-10 NOTE — Telephone Encounter (Signed)
Continues to be orthostatic Reviewed BPs by [facility] RN and considerable drop (to 28-63 systolic) upon standing Will start fludrocortisone

## 2013-11-15 DIAGNOSIS — H35051 Retinal neovascularization, unspecified, right eye: Secondary | ICD-10-CM | POA: Diagnosis not present

## 2013-11-15 DIAGNOSIS — H3532 Exudative age-related macular degeneration: Secondary | ICD-10-CM | POA: Diagnosis not present

## 2013-11-17 DIAGNOSIS — M109 Gout, unspecified: Secondary | ICD-10-CM | POA: Diagnosis not present

## 2013-11-24 ENCOUNTER — Encounter: Payer: Self-pay | Admitting: Internal Medicine

## 2013-12-07 ENCOUNTER — Other Ambulatory Visit: Payer: Self-pay | Admitting: Internal Medicine

## 2013-12-21 ENCOUNTER — Non-Acute Institutional Stay: Payer: Medicare Other | Admitting: Internal Medicine

## 2013-12-21 ENCOUNTER — Encounter: Payer: Self-pay | Admitting: Internal Medicine

## 2013-12-21 VITALS — BP 116/50 | HR 58 | Temp 98.0°F | Resp 14 | Wt 153.0 lb

## 2013-12-21 DIAGNOSIS — J309 Allergic rhinitis, unspecified: Secondary | ICD-10-CM

## 2013-12-21 DIAGNOSIS — I872 Venous insufficiency (chronic) (peripheral): Secondary | ICD-10-CM

## 2013-12-21 MED ORDER — LORATADINE 10 MG PO TABS: ORAL_TABLET | ORAL | Status: AC

## 2013-12-21 NOTE — Patient Instructions (Signed)

## 2013-12-21 NOTE — Addendum Note (Signed)
Addended by: Jearld Fenton on: 12/21/2013 12:11 PM   Modules accepted: Orders

## 2013-12-21 NOTE — Progress Notes (Signed)
Subjective:    Patient ID: Dustin Byrd, male    DOB: [birth year], 80 y.o.   MRN: [id number]  HPI  Asked to evaluate resident in [place] [facility] for a few concerns:  1- He has noticed swelling in his BLE for the last week. It is generally not painful, but painful if he touches it. He reports he has never had swelling in his legs in the past. He is not having any trouble walking. He denies shortness of breath.  2- He has had increase post nasal drip and cough for the last 3 days. The cough is productive of clear mucous. He is also blowing clear mucous from his nose. He denies fever, chills or shortness of breath. He did try Tussin DM but this made him very sleepy so it was stopped. He reports that he does not feel sick.  Review of Systems      Past Medical History  Diagnosis Date  . Gout   . CAD (coronary artery disease)   . DJD of shoulder   . UC (ulcerative colitis)   . Asbestos exposure   . Hypercholesterolemia   . Esophageal stricture   . Orthostatic hypotension   . Personal history of colonic polyps 1991    villous adenoma of cecum  . Memory loss   . 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 Chronic kidney disease, stage III (moderate)   . Encounter for long-term (current) use of other medications   . Diverticulosis of colon (without mention of hemorrhage)   . Anxiety   . Colon cancer 1994    Current Outpatient Prescriptions  Medication Sig Dispense Refill  . allopurinol (ZYLOPRIM) 100 MG tablet TAKE 1 TABLET BY MOUTH ONCE DAILY FOR GOUT 30 tablet 11  . aspirin 81 MG chewable tablet TAKE 1 TABLET BY MOUTH EACH DAY. ANTI PLATELET AGGREGATION 30 tablet 11  . citalopram (CELEXA) 10 MG tablet Take 10 mg by mouth daily.    Marland Kitchen COLCRYS 0.6 MG tablet TAKE 1 TABLET BY MOUTH ONCE DAILY FOR GOUT 30 tablet 11  . Dermatological Products, Misc. (ITCH-ENDER!) LOTN APPLY TO ULCER AREA 3 TIMES A DAY AS DIRECTED. 120 mL 5  .  fludrocortisone (FLORINEF) 0.1 MG tablet Take 0.1 mg by mouth daily.    . fluticasone (FLONASE) 50 MCG/ACT nasal spray Place 2 sprays into both nostrils daily.    Marland Kitchen loratadine (CLARITIN) 10 MG tablet Take 10 mg by mouth daily as needed. For secretions    . LORazepam (ATIVAN) 0.5 MG tablet Take 0.25 mg by mouth 3 (three) times daily as needed.    . nitroGLYCERIN (NITROSTAT) 0.4 MG SL tablet Place 1 tablet (0.4 mg total) under the tongue every 5 (five) minutes as needed. For chest pain. Call 911 after 3 doses if no relief. 25 tablet 1  . omeprazole (PRILOSEC) 20 MG capsule TAKE 1 CAPSULE BY MOUTH ONCE A DAY. (G.E.R.D.) DO NOT CRUSH 30 capsule 11  . PENTASA 250 MG CR capsule TAKE 1 CAPSULE BY MOUTH FOUR TIMES A DAY FOR ULCERATIVE COLITIS. 120 capsule 11  . polyethylene glycol (MIRALAX / GLYCOLAX) packet Take 17 g by mouth 2 (two) times daily. 200 each 3  . sennosides-docusate sodium (SENOKOT-S) 8.6-50 MG tablet TAKE 2 TABLETS BY MOUTH TWICE A DAY FOR STOOL SOFTENER. TAKE WITH GLASS OF WATER. 120 tablet 11  . SILVADENE 1 % cream APPLY EVERY DAY TO ULCER UNTIL HEALED 400 g  0   No current facility-administered medications for this visit.    Allergies  Allergen Reactions  . Aricept [Donepezil Hydrochloride]     agitation  . Cortisone   . Prednisone     Mania     Family History  Problem Relation Age of Onset  . Diabetes Father   . Bipolar disorder Daughter   . Stroke Mother     History   Social History  . Marital Status: Widowed    Spouse Name: N/A    Number of Children: 2  . Years of Education: N/A   Occupational History  . Textiles--various positions     Retired   Social History Main Topics  . Smoking status: Former Research scientist (life sciences)  . Smokeless tobacco: Never Used  . Alcohol Use: Yes     Comment: 1-2 oz Scotch per night  . Drug Use: No  . Sexual Activity: Not on file   Other Topics Concern  . Not on file   Social History Narrative   2 daughters      No living will    Daughter Gay Filler has health care POA   Discussed DNR---he requests this ("I want to go in peace")   No tube feedings if cognitively unaware     Constitutional: Denies fever, malaise, fatigue, headache or abrupt weight changes.  HEENT: Pt reports runny nose. Denies eye pain, eye redness, ear pain, ringing in the ears, wax buildup, nasal congestion, bloody nose, or sore throat. Respiratory: Pt reports cough. Denies difficulty breathing, shortness of breath, cough or sputum production.   Cardiovascular: Pt reports BLE edema. Denies chest pain, chest tightness, palpitations or swelling in the hands.    No other specific complaints in a complete review of systems (except as listed in HPI above).  Objective:   Physical Exam   BP 116/50 mmHg  Pulse 58  Temp(Src) 98 F (36.7 C)  Resp 14  Wt 153 lb (69.4 kg) Wt Readings from Last 3 Encounters:  12/21/13 153 lb (69.4 kg)  09/29/13 147 lb (66.679 kg)  06/30/13 147 lb (66.679 kg)    General: Appears his stated age, chronically ill appearing in NAD. Skin: Warm, dry and intact. HEENT: Head: normal shape and size, no sinus tenderness noted; Eyes: sclera white, no icterus, conjunctiva pink; Throat/Mouth: + PND, mucosa pink and moist, no exudate, lesions or ulcerations noted.  Neck: No adenopathy noted. Cardiovascular: Normal rate and rhythm. S1,S2 noted.  No murmur, rubs or gallops noted. 2+ pitting edema of the BLE. Pulmonary/Chest: Normal effort and positive vesicular breath sounds. No respiratory distress. No wheezes, rales or ronchi noted.  Musculoskeletal: Normal flexion, extension and rotation of bilateral ankles. Uses walker for assistance with gait.     BMET    Component Value Date/Time   NA 138 06/20/2011 1950   K 4.1 06/20/2011 1950   CL 100 06/20/2011 1950   CO2 26 06/20/2011 1950   GLUCOSE 85 06/20/2011 1950   BUN 18 06/20/2011 1950   CREATININE 0.99 06/20/2011 1950   CALCIUM 9.3 06/20/2011 1950   GFRNONAA 70* 06/20/2011  1950   GFRAA 81* 06/20/2011 1950    Lipid Panel  No results found for: CHOL, TRIG, HDL, CHOLHDL, VLDL, LDLCALC  CBC    Component Value Date/Time   WBC 8.8 06/20/2011 1950   RBC 4.77 06/20/2011 1950   HGB 15.0 06/20/2011 1950   HCT 44.1 06/20/2011 1950   PLT 121* 06/20/2011 1950   MCV 92.5 06/20/2011 1950   MCH 31.4 06/20/2011  1950   MCHC 34.0 06/20/2011 1950   RDW 14.7 06/20/2011 1950   LYMPHSABS 0.6* 06/20/2011 1950   MONOABS 0.4 06/20/2011 1950   EOSABS 0.0 06/20/2011 1950   BASOSABS 0.0 06/20/2011 1950    Hgb A1C No results found for: HGBA1C      Assessment & Plan:   Allergic Rhinitis:  Will start Claritin  10 mg BID x 1 week, then daily prn after that Watch for fever, chills, or colored mucous from the nose or chest  Venous insufficiency:  He has scars on his legs that look like he has had veins grafted, possibly for his CABG Encouraged him to elevate his legs If this does not help, he can wear TED hose while awake No diuretic at this time  Will follow up as needed

## 2013-12-28 ENCOUNTER — Emergency Department: Payer: Self-pay | Admitting: Emergency Medicine

## 2013-12-28 DIAGNOSIS — I6782 Cerebral ischemia: Secondary | ICD-10-CM | POA: Diagnosis not present

## 2013-12-28 DIAGNOSIS — E119 Type 2 diabetes mellitus without complications: Secondary | ICD-10-CM | POA: Diagnosis not present

## 2013-12-28 DIAGNOSIS — R29810 Facial weakness: Secondary | ICD-10-CM | POA: Diagnosis not present

## 2013-12-28 DIAGNOSIS — Z7982 Long term (current) use of aspirin: Secondary | ICD-10-CM | POA: Diagnosis not present

## 2013-12-28 DIAGNOSIS — G459 Transient cerebral ischemic attack, unspecified: Secondary | ICD-10-CM | POA: Diagnosis not present

## 2013-12-28 DIAGNOSIS — R531 Weakness: Secondary | ICD-10-CM | POA: Diagnosis not present

## 2013-12-28 DIAGNOSIS — I679 Cerebrovascular disease, unspecified: Secondary | ICD-10-CM | POA: Diagnosis not present

## 2013-12-28 DIAGNOSIS — R4781 Slurred speech: Secondary | ICD-10-CM | POA: Diagnosis not present

## 2013-12-28 DIAGNOSIS — Z7951 Long term (current) use of inhaled steroids: Secondary | ICD-10-CM | POA: Diagnosis not present

## 2013-12-28 DIAGNOSIS — Z79899 Other long term (current) drug therapy: Secondary | ICD-10-CM | POA: Diagnosis not present

## 2013-12-28 DIAGNOSIS — I69951 Hemiplegia and hemiparesis following unspecified cerebrovascular disease affecting right dominant side: Secondary | ICD-10-CM | POA: Diagnosis not present

## 2013-12-28 LAB — URINALYSIS, COMPLETE
BLOOD: NEGATIVE
Bacteria: NONE SEEN
Bilirubin,UR: NEGATIVE
GLUCOSE, UR: NEGATIVE mg/dL (ref 0–75)
Leukocyte Esterase: NEGATIVE
Nitrite: NEGATIVE
Ph: 5 (ref 4.5–8.0)
Protein: NEGATIVE
RBC,UR: 1 /HPF (ref 0–5)
Specific Gravity: 1.016 (ref 1.003–1.030)
Squamous Epithelial: NONE SEEN
WBC UR: 1 /HPF (ref 0–5)

## 2013-12-28 LAB — BASIC METABOLIC PANEL
ANION GAP: 7 (ref 7–16)
BUN: 9 mg/dL (ref 7–18)
CHLORIDE: 104 mmol/L (ref 98–107)
CREATININE: 0.97 mg/dL (ref 0.60–1.30)
Calcium, Total: 8.2 mg/dL — ABNORMAL LOW (ref 8.5–10.1)
Co2: 31 mmol/L (ref 21–32)
EGFR (African American): >60
EGFR (Non-African Amer.): >60
GLUCOSE: 85 mg/dL (ref 65–99)
OSMOLALITY: 281 (ref 275–301)
POTASSIUM: 3.6 mmol/L (ref 3.5–5.1)
SODIUM: 142 mmol/L (ref 136–145)

## 2013-12-28 LAB — CBC
HCT: 37.7 % — ABNORMAL LOW (ref 40.0–52.0)
HGB: 12.1 g/dL — AB (ref 13.0–18.0)
MCH: 30.8 pg (ref 26.0–34.0)
MCHC: 32.2 g/dL (ref 32.0–36.0)
MCV: 96 fL (ref 80–100)
PLATELETS: 146 10*3/uL — AB (ref 150–440)
RBC: 3.94 10*6/uL — ABNORMAL LOW (ref 4.40–5.90)
RDW: 14.4 % (ref 11.5–14.5)
WBC: 5.7 10*3/uL (ref 3.8–10.6)

## 2014-01-04 ENCOUNTER — Encounter: Payer: Self-pay | Admitting: Internal Medicine

## 2014-01-04 ENCOUNTER — Non-Acute Institutional Stay: Payer: Medicare Other | Admitting: Internal Medicine

## 2014-01-04 VITALS — BP 100/52 | HR 73 | Resp 16

## 2014-01-04 DIAGNOSIS — M1711 Unilateral primary osteoarthritis, right knee: Secondary | ICD-10-CM | POA: Diagnosis not present

## 2014-01-04 DIAGNOSIS — G459 Transient cerebral ischemic attack, unspecified: Secondary | ICD-10-CM | POA: Diagnosis not present

## 2014-01-04 DIAGNOSIS — R413 Other amnesia: Secondary | ICD-10-CM

## 2014-01-04 NOTE — Progress Notes (Signed)
Subjective:    Patient ID: Dustin Byrd, male    DOB: 08/21/1918, 80 y.o.   MRN: [id number]  HPI  Asked to visit resident in [place] to ER last week with stroke like symptoms, by the time he had gotten to the ER, symptoms had resolved. Diagnosed with a TIA and sent back to [facility] Slightly more confused since his return, had 1 episode of stool incontinence Still able to dress himself, go down for meals Does c/o pain in his right lower leg, started 1 week ago, some better now. Denies chest pain or shortness of breath  Review of Systems      Past Medical History  Diagnosis Date  . Gout   . CAD (coronary artery disease)   . DJD of shoulder   . UC (ulcerative colitis)   . Asbestos exposure   . Hypercholesterolemia   . Esophageal stricture   . Orthostatic hypotension   . Personal history of colonic polyps 1991    villous adenoma of cecum  . Memory loss   . 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 Chronic kidney disease, stage III (moderate)   . Encounter for long-term (current) use of other medications   . Diverticulosis of colon (without mention of hemorrhage)   . Anxiety   . Colon cancer 1994    Current Outpatient Prescriptions  Medication Sig Dispense Refill  . allopurinol (ZYLOPRIM) 100 MG tablet TAKE 1 TABLET BY MOUTH ONCE DAILY FOR GOUT 30 tablet 11  . aspirin 81 MG chewable tablet TAKE 1 TABLET BY MOUTH EACH DAY. ANTI PLATELET AGGREGATION 30 tablet 11  . citalopram (CELEXA) 10 MG tablet Take 10 mg by mouth daily.    Marland Kitchen COLCRYS 0.6 MG tablet TAKE 1 TABLET BY MOUTH ONCE DAILY FOR GOUT 30 tablet 11  . Dermatological Products, Misc. (ITCH-ENDER!) LOTN APPLY TO ULCER AREA 3 TIMES A DAY AS DIRECTED. 120 mL 5  . fludrocortisone (FLORINEF) 0.1 MG tablet Take 0.1 mg by mouth daily.    . fluticasone (FLONASE) 50 MCG/ACT nasal spray Place 2 sprays into both nostrils daily.    Marland Kitchen loratadine  (CLARITIN) 10 MG tablet Take 1 tab BID x 5 days then daily prn thereafter 10 tablet 0  . LORazepam (ATIVAN) 0.5 MG tablet Take 0.25 mg by mouth 3 (three) times daily as needed.    . nitroGLYCERIN (NITROSTAT) 0.4 MG SL tablet Place 1 tablet (0.4 mg total) under the tongue every 5 (five) minutes as needed. For chest pain. Call 911 after 3 doses if no relief. 25 tablet 1  . omeprazole (PRILOSEC) 20 MG capsule TAKE 1 CAPSULE BY MOUTH ONCE A DAY. (G.E.R.D.) DO NOT CRUSH 30 capsule 11  . PENTASA 250 MG CR capsule TAKE 1 CAPSULE BY MOUTH FOUR TIMES A DAY FOR ULCERATIVE COLITIS. 120 capsule 11  . polyethylene glycol (MIRALAX / GLYCOLAX) packet Take 17 g by mouth 2 (two) times daily. 200 each 3  . sennosides-docusate sodium (SENOKOT-S) 8.6-50 MG tablet TAKE 2 TABLETS BY MOUTH TWICE A DAY FOR STOOL SOFTENER. TAKE WITH GLASS OF WATER. 120 tablet 11  . SILVADENE 1 % cream APPLY EVERY DAY TO ULCER UNTIL HEALED 400 g 0   No current facility-administered medications for this visit.    Allergies  Allergen Reactions  . Aricept [Donepezil Hydrochloride]     agitation  . Cortisone   . Prednisone     Mania  Family History  Problem Relation Age of Onset  . Diabetes Father   . Bipolar disorder Daughter   . Stroke Mother     History   Social History  . Marital Status: Single    Spouse Name: N/A    Number of Children: 2  . Years of Education: N/A   Occupational History  . Textiles--various positions     Retired   Social History Main Topics  . Smoking status: Former Research scientist (life sciences)  . Smokeless tobacco: Never Used  . Alcohol Use: Yes     Comment: 1-2 oz Scotch per night  . Drug Use: No  . Sexual Activity: Not on file   Other Topics Concern  . Not on file   Social History Narrative   2 daughters      No living will   Daughter Gay Filler has health care POA   Discussed DNR---he requests this ("I want to go in peace")   No tube feedings if cognitively unaware     Constitutional: Pt reports  fatigue. Denies fever, malaise, headache or abrupt weight changes.  HEENT: Denies eye pain, eye redness, ear pain, ringing in the ears, wax buildup, runny nose, nasal congestion, bloody nose, or sore throat. Respiratory: Pt reports shortness of breath with exertion. Denies difficulty breathing, cough or sputum production.   Cardiovascular: Denies chest pain, chest tightness, palpitations or swelling in the hands or feet.  Musculoskeletal: Pt reports right lower leg pain. Denies decrease in range of motion, muscle pain or joint pain and swelling.  Neurological: Pt reports difficulty with memory. Denies dizziness, difficulty with speech or problems with balance and coordination.   No other specific complaints in a complete review of systems (except as listed in HPI above). ' Objective:   Physical Exam  BP 100/52 mmHg  Pulse 73  Resp 16 Wt Readings from Last 3 Encounters:  12/21/13 153 lb (69.4 kg)  09/29/13 147 lb (66.679 kg)  06/30/13 147 lb (66.679 kg)    General: Appears his stated age, chronically ill appearing in NAD. Skin: Warm, dry and intact.  Neck: Neck supple, trachea midline. No masses, lumps or thyromegaly present.  Cardiovascular: Normal rate and rhythm. S1,S2 noted.  No murmur, rubs or gallops noted. No JVD. Trace BLE edema. Pulmonary/Chest: Normal effort and positive vesicular breath sounds. No respiratory distress. No wheezes, rales or ronchi noted.  Musculoskeletal: Normal flexion and extension of the right knee. Crepitus noted with ROM. No pain with palpation. Using walker for assistance with gaitt.  Neurological: Alert and oriented to person and place only. Coordination normal but slow.    BMET    Component Value Date/Time   NA 138 06/20/2011 1950   K 4.1 06/20/2011 1950   CL 100 06/20/2011 1950   CO2 26 06/20/2011 1950   GLUCOSE 85 06/20/2011 1950   BUN 18 06/20/2011 1950   CREATININE 0.99 06/20/2011 1950   CALCIUM 9.3 06/20/2011 1950   GFRNONAA 70*  06/20/2011 1950   GFRAA 81* 06/20/2011 1950    Lipid Panel  No results found for: CHOL, TRIG, HDL, CHOLHDL, VLDL, LDLCALC  CBC    Component Value Date/Time   WBC 8.8 06/20/2011 1950   RBC 4.77 06/20/2011 1950   HGB 15.0 06/20/2011 1950   HCT 44.1 06/20/2011 1950   PLT 121* 06/20/2011 1950   MCV 92.5 06/20/2011 1950   MCH 31.4 06/20/2011 1950   MCHC 34.0 06/20/2011 1950   RDW 14.7 06/20/2011 1950   LYMPHSABS 0.6* 06/20/2011 1950  MONOABS 0.4 06/20/2011 1950   EOSABS 0.0 06/20/2011 1950   BASOSABS 0.0 06/20/2011 1950    Hgb A1C No results found for: HGBA1C       Assessment & Plan:   Hospital follow up for TIA:  Will have him continue ASA He is not on a statin Hospital notes reviewed  Memory Impairment:  Chronic but slightly worse after recent TIA Still appropriate for ALF Will continue to monitor  OA right knee:  Has tylenol PRN if needed  Will follow up with as needed, Leafy Ro will bring any concerns to out attention

## 2014-01-13 DIAGNOSIS — I259 Chronic ischemic heart disease, unspecified: Secondary | ICD-10-CM | POA: Diagnosis not present

## 2014-01-13 DIAGNOSIS — F0150 Vascular dementia without behavioral disturbance: Secondary | ICD-10-CM

## 2014-01-13 DIAGNOSIS — K5190 Ulcerative colitis, unspecified, without complications: Secondary | ICD-10-CM | POA: Diagnosis not present

## 2014-01-13 DIAGNOSIS — D696 Thrombocytopenia, unspecified: Secondary | ICD-10-CM | POA: Diagnosis not present

## 2014-01-13 DIAGNOSIS — R262 Difficulty in walking, not elsewhere classified: Secondary | ICD-10-CM | POA: Diagnosis not present

## 2014-01-13 DIAGNOSIS — F39 Unspecified mood [affective] disorder: Secondary | ICD-10-CM

## 2014-01-13 DIAGNOSIS — G459 Transient cerebral ischemic attack, unspecified: Secondary | ICD-10-CM | POA: Diagnosis not present

## 2014-01-13 DIAGNOSIS — N390 Urinary tract infection, site not specified: Secondary | ICD-10-CM | POA: Diagnosis not present

## 2014-01-13 DIAGNOSIS — Z8673 Personal history of transient ischemic attack (TIA), and cerebral infarction without residual deficits: Secondary | ICD-10-CM | POA: Diagnosis not present

## 2014-01-13 DIAGNOSIS — I951 Orthostatic hypotension: Secondary | ICD-10-CM | POA: Diagnosis not present

## 2014-01-13 DIAGNOSIS — M6281 Muscle weakness (generalized): Secondary | ICD-10-CM | POA: Diagnosis not present

## 2014-01-16 DIAGNOSIS — R262 Difficulty in walking, not elsewhere classified: Secondary | ICD-10-CM | POA: Diagnosis not present

## 2014-01-16 DIAGNOSIS — M6281 Muscle weakness (generalized): Secondary | ICD-10-CM | POA: Diagnosis not present

## 2014-01-16 DIAGNOSIS — I259 Chronic ischemic heart disease, unspecified: Secondary | ICD-10-CM | POA: Diagnosis not present

## 2014-01-17 DIAGNOSIS — M6281 Muscle weakness (generalized): Secondary | ICD-10-CM | POA: Diagnosis not present

## 2014-01-17 DIAGNOSIS — R262 Difficulty in walking, not elsewhere classified: Secondary | ICD-10-CM | POA: Diagnosis not present

## 2014-01-17 DIAGNOSIS — I259 Chronic ischemic heart disease, unspecified: Secondary | ICD-10-CM | POA: Diagnosis not present

## 2014-01-18 DIAGNOSIS — I259 Chronic ischemic heart disease, unspecified: Secondary | ICD-10-CM | POA: Diagnosis not present

## 2014-01-18 DIAGNOSIS — M6281 Muscle weakness (generalized): Secondary | ICD-10-CM | POA: Diagnosis not present

## 2014-01-18 DIAGNOSIS — R262 Difficulty in walking, not elsewhere classified: Secondary | ICD-10-CM | POA: Diagnosis not present

## 2014-01-19 DIAGNOSIS — I259 Chronic ischemic heart disease, unspecified: Secondary | ICD-10-CM | POA: Diagnosis not present

## 2014-01-19 DIAGNOSIS — M6281 Muscle weakness (generalized): Secondary | ICD-10-CM | POA: Diagnosis not present

## 2014-01-19 DIAGNOSIS — R262 Difficulty in walking, not elsewhere classified: Secondary | ICD-10-CM | POA: Diagnosis not present

## 2014-01-20 DIAGNOSIS — I259 Chronic ischemic heart disease, unspecified: Secondary | ICD-10-CM | POA: Diagnosis not present

## 2014-01-20 DIAGNOSIS — R262 Difficulty in walking, not elsewhere classified: Secondary | ICD-10-CM | POA: Diagnosis not present

## 2014-01-20 DIAGNOSIS — M6281 Muscle weakness (generalized): Secondary | ICD-10-CM | POA: Diagnosis not present

## 2014-01-23 DIAGNOSIS — I259 Chronic ischemic heart disease, unspecified: Secondary | ICD-10-CM | POA: Diagnosis not present

## 2014-01-23 DIAGNOSIS — R262 Difficulty in walking, not elsewhere classified: Secondary | ICD-10-CM | POA: Diagnosis not present

## 2014-01-23 DIAGNOSIS — M6281 Muscle weakness (generalized): Secondary | ICD-10-CM | POA: Diagnosis not present

## 2014-01-24 DIAGNOSIS — R262 Difficulty in walking, not elsewhere classified: Secondary | ICD-10-CM | POA: Diagnosis not present

## 2014-01-24 DIAGNOSIS — M6281 Muscle weakness (generalized): Secondary | ICD-10-CM | POA: Diagnosis not present

## 2014-01-24 DIAGNOSIS — I259 Chronic ischemic heart disease, unspecified: Secondary | ICD-10-CM | POA: Diagnosis not present

## 2014-01-25 DIAGNOSIS — M6281 Muscle weakness (generalized): Secondary | ICD-10-CM | POA: Diagnosis not present

## 2014-01-25 DIAGNOSIS — I259 Chronic ischemic heart disease, unspecified: Secondary | ICD-10-CM | POA: Diagnosis not present

## 2014-01-25 DIAGNOSIS — R262 Difficulty in walking, not elsewhere classified: Secondary | ICD-10-CM | POA: Diagnosis not present

## 2014-01-26 DIAGNOSIS — I259 Chronic ischemic heart disease, unspecified: Secondary | ICD-10-CM | POA: Diagnosis not present

## 2014-01-26 DIAGNOSIS — M6281 Muscle weakness (generalized): Secondary | ICD-10-CM | POA: Diagnosis not present

## 2014-01-26 DIAGNOSIS — R262 Difficulty in walking, not elsewhere classified: Secondary | ICD-10-CM | POA: Diagnosis not present

## 2014-01-27 DIAGNOSIS — I259 Chronic ischemic heart disease, unspecified: Secondary | ICD-10-CM | POA: Diagnosis not present

## 2014-01-27 DIAGNOSIS — M6281 Muscle weakness (generalized): Secondary | ICD-10-CM | POA: Diagnosis not present

## 2014-01-27 DIAGNOSIS — R262 Difficulty in walking, not elsewhere classified: Secondary | ICD-10-CM | POA: Diagnosis not present

## 2014-01-30 DIAGNOSIS — R262 Difficulty in walking, not elsewhere classified: Secondary | ICD-10-CM | POA: Diagnosis not present

## 2014-01-30 DIAGNOSIS — M6281 Muscle weakness (generalized): Secondary | ICD-10-CM | POA: Diagnosis not present

## 2014-01-30 DIAGNOSIS — I259 Chronic ischemic heart disease, unspecified: Secondary | ICD-10-CM | POA: Diagnosis not present

## 2014-01-31 DIAGNOSIS — I259 Chronic ischemic heart disease, unspecified: Secondary | ICD-10-CM | POA: Diagnosis not present

## 2014-01-31 DIAGNOSIS — M6281 Muscle weakness (generalized): Secondary | ICD-10-CM | POA: Diagnosis not present

## 2014-01-31 DIAGNOSIS — R262 Difficulty in walking, not elsewhere classified: Secondary | ICD-10-CM | POA: Diagnosis not present

## 2014-02-01 DIAGNOSIS — R262 Difficulty in walking, not elsewhere classified: Secondary | ICD-10-CM | POA: Diagnosis not present

## 2014-02-01 DIAGNOSIS — M6281 Muscle weakness (generalized): Secondary | ICD-10-CM | POA: Diagnosis not present

## 2014-02-01 DIAGNOSIS — I259 Chronic ischemic heart disease, unspecified: Secondary | ICD-10-CM | POA: Diagnosis not present

## 2014-02-07 DIAGNOSIS — K5190 Ulcerative colitis, unspecified, without complications: Secondary | ICD-10-CM | POA: Diagnosis not present

## 2014-02-07 DIAGNOSIS — I69398 Other sequelae of cerebral infarction: Secondary | ICD-10-CM | POA: Diagnosis not present

## 2014-02-07 DIAGNOSIS — R0789 Other chest pain: Secondary | ICD-10-CM | POA: Diagnosis not present

## 2014-02-07 DIAGNOSIS — F0150 Vascular dementia without behavioral disturbance: Secondary | ICD-10-CM | POA: Diagnosis not present

## 2014-02-07 DIAGNOSIS — F39 Unspecified mood [affective] disorder: Secondary | ICD-10-CM

## 2014-03-10 DIAGNOSIS — F0150 Vascular dementia without behavioral disturbance: Secondary | ICD-10-CM

## 2014-03-10 DIAGNOSIS — K5100 Ulcerative (chronic) pancolitis without complications: Secondary | ICD-10-CM | POA: Diagnosis not present

## 2014-03-10 DIAGNOSIS — G479 Sleep disorder, unspecified: Secondary | ICD-10-CM

## 2014-03-10 DIAGNOSIS — F39 Unspecified mood [affective] disorder: Secondary | ICD-10-CM

## 2014-03-10 DIAGNOSIS — I69398 Other sequelae of cerebral infarction: Secondary | ICD-10-CM | POA: Diagnosis not present

## 2014-03-10 DIAGNOSIS — I951 Orthostatic hypotension: Secondary | ICD-10-CM | POA: Diagnosis not present

## 2014-03-10 DIAGNOSIS — M1000 Idiopathic gout, unspecified site: Secondary | ICD-10-CM

## 2014-03-10 DIAGNOSIS — K219 Gastro-esophageal reflux disease without esophagitis: Secondary | ICD-10-CM | POA: Diagnosis not present

## 2014-04-11 DIAGNOSIS — I69398 Other sequelae of cerebral infarction: Secondary | ICD-10-CM | POA: Diagnosis not present

## 2014-04-11 DIAGNOSIS — K5190 Ulcerative colitis, unspecified, without complications: Secondary | ICD-10-CM | POA: Diagnosis not present

## 2014-04-11 DIAGNOSIS — F0150 Vascular dementia without behavioral disturbance: Secondary | ICD-10-CM | POA: Diagnosis not present

## 2014-04-11 DIAGNOSIS — I951 Orthostatic hypotension: Secondary | ICD-10-CM | POA: Diagnosis not present

## 2014-04-25 ENCOUNTER — Emergency Department
Admit: 2014-04-25 | Disposition: A | Discharge status: Home / Self Care | Payer: Self-pay | Admitting: Emergency Medicine

## 2014-04-25 DIAGNOSIS — R269 Unspecified abnormalities of gait and mobility: Secondary | ICD-10-CM | POA: Diagnosis not present

## 2014-04-25 DIAGNOSIS — J9809 Other diseases of bronchus, not elsewhere classified: Secondary | ICD-10-CM | POA: Diagnosis not present

## 2014-04-25 DIAGNOSIS — R918 Other nonspecific abnormal finding of lung field: Secondary | ICD-10-CM | POA: Diagnosis not present

## 2014-04-25 DIAGNOSIS — R4182 Altered mental status, unspecified: Secondary | ICD-10-CM | POA: Diagnosis not present

## 2014-04-25 DIAGNOSIS — R410 Disorientation, unspecified: Secondary | ICD-10-CM | POA: Diagnosis not present

## 2014-04-25 DIAGNOSIS — F0390 Unspecified dementia without behavioral disturbance: Secondary | ICD-10-CM | POA: Diagnosis not present

## 2014-04-26 ENCOUNTER — Telehealth: Payer: Self-pay | Admitting: Internal Medicine

## 2014-04-26 DIAGNOSIS — R918 Other nonspecific abnormal finding of lung field: Secondary | ICD-10-CM | POA: Diagnosis not present

## 2014-04-26 DIAGNOSIS — R531 Weakness: Secondary | ICD-10-CM | POA: Diagnosis not present

## 2014-04-26 DIAGNOSIS — J9809 Other diseases of bronchus, not elsewhere classified: Secondary | ICD-10-CM | POA: Diagnosis not present

## 2014-04-26 DIAGNOSIS — R269 Unspecified abnormalities of gait and mobility: Secondary | ICD-10-CM | POA: Diagnosis not present

## 2014-04-26 DIAGNOSIS — R410 Disorientation, unspecified: Secondary | ICD-10-CM | POA: Diagnosis not present

## 2014-04-26 LAB — COMPREHENSIVE METABOLIC PANEL
ALK PHOS: 90 U/L
ALT: 16 U/L — AB
Albumin: 3.8 g/dL
Anion Gap: 6 — ABNORMAL LOW (ref 7–16)
BILIRUBIN TOTAL: 0.9 mg/dL
BUN: 35 mg/dL — AB
CO2: 30 mmol/L
Calcium, Total: 8.7 mg/dL — ABNORMAL LOW
Chloride: 102 mmol/L
Creatinine: 1.22 mg/dL
EGFR (African American): 59 — ABNORMAL LOW
EGFR (Non-African Amer.): 51 — ABNORMAL LOW
GLUCOSE: 106 mg/dL — AB
POTASSIUM: 3.5 mmol/L
SGOT(AST): 28 U/L
SODIUM: 138 mmol/L
Total Protein: 6.9 g/dL

## 2014-04-26 LAB — CBC WITH DIFFERENTIAL/PLATELET
BASOS ABS: 0 10*3/uL (ref 0.0–0.1)
Basophil %: 0.1 %
Eosinophil #: 0 10*3/uL (ref 0.0–0.7)
Eosinophil %: 0.1 %
HCT: 37.9 % — AB (ref 40.0–52.0)
HGB: 12.4 g/dL — ABNORMAL LOW (ref 13.0–18.0)
Lymphocyte #: 0.1 10*3/uL — ABNORMAL LOW (ref 1.0–3.6)
Lymphocyte %: 1 %
MCH: 28.7 pg (ref 26.0–34.0)
MCHC: 32.6 g/dL (ref 32.0–36.0)
MCV: 88 fL (ref 80–100)
MONO ABS: 0.6 x10 3/mm (ref 0.2–1.0)
Monocyte %: 4.7 %
Neutrophil #: 12 10*3/uL — ABNORMAL HIGH (ref 1.4–6.5)
Neutrophil %: 94.1 %
Platelet: 124 10*3/uL — ABNORMAL LOW (ref 150–440)
RBC: 4.31 10*6/uL — ABNORMAL LOW (ref 4.40–5.90)
RDW: 16 % — AB (ref 11.5–14.5)
WBC: 12.7 10*3/uL — ABNORMAL HIGH (ref 3.8–10.6)

## 2014-04-26 LAB — URINALYSIS, COMPLETE
Bacteria: NONE SEEN
Bilirubin,UR: NEGATIVE
Blood: NEGATIVE
GLUCOSE, UR: NEGATIVE mg/dL (ref 0–75)
Ketone: NEGATIVE
Leukocyte Esterase: NEGATIVE
NITRITE: NEGATIVE
Ph: 5 (ref 4.5–8.0)
Protein: 30
SPECIFIC GRAVITY: 1.019 (ref 1.003–1.030)

## 2014-04-26 LAB — CK TOTAL AND CKMB (NOT AT ARMC)
CK, Total: 54 U/L
CK-MB: 1.7 ng/mL

## 2014-04-26 LAB — TROPONIN I: Troponin-I: <0.03 ng/mL

## 2014-04-26 NOTE — Telephone Encounter (Signed)
Will follow up at [facility]

## 2014-04-26 NOTE — Telephone Encounter (Signed)
PLEASE NOTE: All timestamps contained within this report are represented as [place] Standard Time. CONFIDENTIALTY NOTICE: This fax transmission is intended only for the addressee. It contains information that is legally privileged, confidential or otherwise protected from use or disclosure. If you are not the intended recipient, you are strictly prohibited from reviewing, disclosing, copying using or disseminating any of this information or taking any action in reliance on or regarding this information. If you have received this fax in error, please notify us immediately by telephone so that we can arrange for its return to [place]. Phone: [phone], Toll-Free: [phone], Fax: [phone] Page: 1 of 2 Call Id: [id number] [place] Patient Name: Dustin Byrd Gender: Male DOB: 07/09/1918 Age: 81 Y 9 M 23 D Return Phone Number: Address: City/State/Zip: [place] Client [place] Night - Client Client Site [place] Physician Viviana Simpler Contact Type Call Call Type Triage / Clinical Caller Name Caryl Pina Relationship To Patient Provider Return Phone Number Unavailable Chief Complaint Dizziness Initial Comment Caller states Caryl Pina from [facility] and her patient is really shaky and almost fell over, BP is 180/80.CB# [phone] PreDisposition InappropriateToAsk Nurse Assessment Nurse: Angeline Slim, RN, [place] Date/Time (Eastern Time): 04/25/2014 10:35:23 PM Confirm and document reason for call. If symptomatic, describe symptoms. ---caller states pt blood pressure is 180/80 and o2 sat 86 on room and now is 94 percent on 2/l Has the patient traveled out of the country within the last 30 days? ---No Does the patient require triage? ---Yes Related visit to physician within the last 2 weeks? ---Yes Does the PT have any chronic conditions? (i.e.  diabetes, asthma, etc.) ---Yes List chronic conditions. ---TIA Guidelines Guideline Title Affirmed Question Affirmed Notes Nurse Date/Time (Eastern Time) Breathing Difficulty Wheezing started suddenly after medicine, an allergic food or bee sting Zellers, RN, [place] 04/25/2014 10:37:28 PM Disp. Time Eilene Ghazi Time) Disposition Final User 04/25/2014 10:10:11 PM Send to [place] 04/25/2014 10:23:26 PM Send To Clinical Follow Up Vivi Martens 04/25/2014 10:38:08 PM Call EMS 911 Now Yes Zellers, RN, [place] Caller Understands: Yes PLEASE NOTE: All timestamps contained within this report are represented as [place] Standard Time. CONFIDENTIALTY NOTICE: This fax transmission is intended only for the addressee. It contains information that is legally privileged, confidential or otherwise protected from use or disclosure. If you are not the intended recipient, you are strictly prohibited from reviewing, disclosing, copying using or disseminating any of this information or taking any action in reliance on or regarding this information. If you have received this fax in error, please notify us immediately by telephone so that we can arrange for its return to [place]. Phone: [phone], Toll-Free: [phone], Fax: [phone] Page: 2 of 2 Call Id: [id number] Disagree/Comply: Comply Care Advice Given Per Guideline CALL EMS 911 NOW: Immediate medical attention is needed. You need to hang up and call 911 (or an ambulance). Psychologist, forensic Discretion: I'll call you back in a few minutes to be sure you were able to reach them.) CARE ADVICE given per Breathing Difficulty (Adult) guideline. After Care Instructions Given Call Event Type User Date / Time Description

## 2014-05-16 DIAGNOSIS — L570 Actinic keratosis: Secondary | ICD-10-CM | POA: Diagnosis not present

## 2014-06-09 DIAGNOSIS — T8189XA Other complications of procedures, not elsewhere classified, initial encounter: Secondary | ICD-10-CM | POA: Diagnosis not present

## 2014-07-03 DIAGNOSIS — R2689 Other abnormalities of gait and mobility: Secondary | ICD-10-CM | POA: Diagnosis not present

## 2014-07-03 DIAGNOSIS — M6281 Muscle weakness (generalized): Secondary | ICD-10-CM | POA: Diagnosis not present

## 2014-07-03 DIAGNOSIS — F0390 Unspecified dementia without behavioral disturbance: Secondary | ICD-10-CM | POA: Diagnosis not present

## 2014-07-04 DIAGNOSIS — R2689 Other abnormalities of gait and mobility: Secondary | ICD-10-CM | POA: Diagnosis not present

## 2014-07-04 DIAGNOSIS — F0390 Unspecified dementia without behavioral disturbance: Secondary | ICD-10-CM | POA: Diagnosis not present

## 2014-07-04 DIAGNOSIS — M6281 Muscle weakness (generalized): Secondary | ICD-10-CM | POA: Diagnosis not present

## 2014-07-05 DIAGNOSIS — R2689 Other abnormalities of gait and mobility: Secondary | ICD-10-CM | POA: Diagnosis not present

## 2014-07-05 DIAGNOSIS — F0390 Unspecified dementia without behavioral disturbance: Secondary | ICD-10-CM | POA: Diagnosis not present

## 2014-07-05 DIAGNOSIS — M6281 Muscle weakness (generalized): Secondary | ICD-10-CM | POA: Diagnosis not present

## 2014-07-06 DIAGNOSIS — R2689 Other abnormalities of gait and mobility: Secondary | ICD-10-CM | POA: Diagnosis not present

## 2014-07-06 DIAGNOSIS — F0390 Unspecified dementia without behavioral disturbance: Secondary | ICD-10-CM | POA: Diagnosis not present

## 2014-07-06 DIAGNOSIS — M6281 Muscle weakness (generalized): Secondary | ICD-10-CM | POA: Diagnosis not present

## 2014-07-08 DIAGNOSIS — M6281 Muscle weakness (generalized): Secondary | ICD-10-CM | POA: Diagnosis not present

## 2014-07-08 DIAGNOSIS — R2689 Other abnormalities of gait and mobility: Secondary | ICD-10-CM | POA: Diagnosis not present

## 2014-07-10 DIAGNOSIS — M6281 Muscle weakness (generalized): Secondary | ICD-10-CM | POA: Diagnosis not present

## 2014-07-10 DIAGNOSIS — R2689 Other abnormalities of gait and mobility: Secondary | ICD-10-CM | POA: Diagnosis not present

## 2014-07-11 DIAGNOSIS — M6281 Muscle weakness (generalized): Secondary | ICD-10-CM | POA: Diagnosis not present

## 2014-07-11 DIAGNOSIS — R2689 Other abnormalities of gait and mobility: Secondary | ICD-10-CM | POA: Diagnosis not present

## 2014-07-12 DIAGNOSIS — R2689 Other abnormalities of gait and mobility: Secondary | ICD-10-CM | POA: Diagnosis not present

## 2014-07-12 DIAGNOSIS — M6281 Muscle weakness (generalized): Secondary | ICD-10-CM | POA: Diagnosis not present

## 2014-07-13 DIAGNOSIS — M6281 Muscle weakness (generalized): Secondary | ICD-10-CM | POA: Diagnosis not present

## 2014-07-13 DIAGNOSIS — R2689 Other abnormalities of gait and mobility: Secondary | ICD-10-CM | POA: Diagnosis not present

## 2014-07-14 DIAGNOSIS — M6281 Muscle weakness (generalized): Secondary | ICD-10-CM | POA: Diagnosis not present

## 2014-07-14 DIAGNOSIS — R2689 Other abnormalities of gait and mobility: Secondary | ICD-10-CM | POA: Diagnosis not present

## 2014-07-17 DIAGNOSIS — M6281 Muscle weakness (generalized): Secondary | ICD-10-CM | POA: Diagnosis not present

## 2014-07-17 DIAGNOSIS — R2689 Other abnormalities of gait and mobility: Secondary | ICD-10-CM | POA: Diagnosis not present

## 2014-07-18 DIAGNOSIS — M6281 Muscle weakness (generalized): Secondary | ICD-10-CM | POA: Diagnosis not present

## 2014-07-18 DIAGNOSIS — R2689 Other abnormalities of gait and mobility: Secondary | ICD-10-CM | POA: Diagnosis not present

## 2014-07-19 DIAGNOSIS — R2689 Other abnormalities of gait and mobility: Secondary | ICD-10-CM | POA: Diagnosis not present

## 2014-07-19 DIAGNOSIS — M6281 Muscle weakness (generalized): Secondary | ICD-10-CM | POA: Diagnosis not present

## 2014-07-20 DIAGNOSIS — R2689 Other abnormalities of gait and mobility: Secondary | ICD-10-CM | POA: Diagnosis not present

## 2014-07-20 DIAGNOSIS — M6281 Muscle weakness (generalized): Secondary | ICD-10-CM | POA: Diagnosis not present

## 2014-07-21 DIAGNOSIS — M6281 Muscle weakness (generalized): Secondary | ICD-10-CM | POA: Diagnosis not present

## 2014-07-21 DIAGNOSIS — R2689 Other abnormalities of gait and mobility: Secondary | ICD-10-CM | POA: Diagnosis not present

## 2014-08-15 DIAGNOSIS — M6281 Muscle weakness (generalized): Secondary | ICD-10-CM | POA: Diagnosis not present

## 2014-08-15 DIAGNOSIS — R419 Unspecified symptoms and signs involving cognitive functions and awareness: Secondary | ICD-10-CM | POA: Diagnosis not present

## 2014-08-15 DIAGNOSIS — R296 Repeated falls: Secondary | ICD-10-CM | POA: Diagnosis not present

## 2014-08-16 DIAGNOSIS — F0150 Vascular dementia without behavioral disturbance: Secondary | ICD-10-CM

## 2014-08-16 DIAGNOSIS — R296 Repeated falls: Secondary | ICD-10-CM | POA: Diagnosis not present

## 2014-08-16 DIAGNOSIS — K219 Gastro-esophageal reflux disease without esophagitis: Secondary | ICD-10-CM | POA: Diagnosis not present

## 2014-08-16 DIAGNOSIS — R419 Unspecified symptoms and signs involving cognitive functions and awareness: Secondary | ICD-10-CM | POA: Diagnosis not present

## 2014-08-16 DIAGNOSIS — I69398 Other sequelae of cerebral infarction: Secondary | ICD-10-CM | POA: Diagnosis not present

## 2014-08-16 DIAGNOSIS — M6281 Muscle weakness (generalized): Secondary | ICD-10-CM | POA: Diagnosis not present

## 2014-08-16 DIAGNOSIS — G479 Sleep disorder, unspecified: Secondary | ICD-10-CM

## 2014-08-16 DIAGNOSIS — K5100 Ulcerative (chronic) pancolitis without complications: Secondary | ICD-10-CM | POA: Diagnosis not present

## 2014-08-16 DIAGNOSIS — I951 Orthostatic hypotension: Secondary | ICD-10-CM

## 2014-08-16 DIAGNOSIS — M10069 Idiopathic gout, unspecified knee: Secondary | ICD-10-CM

## 2014-08-17 DIAGNOSIS — R419 Unspecified symptoms and signs involving cognitive functions and awareness: Secondary | ICD-10-CM | POA: Diagnosis not present

## 2014-08-17 DIAGNOSIS — M6281 Muscle weakness (generalized): Secondary | ICD-10-CM | POA: Diagnosis not present

## 2014-08-17 DIAGNOSIS — R296 Repeated falls: Secondary | ICD-10-CM | POA: Diagnosis not present

## 2014-08-18 DIAGNOSIS — M6281 Muscle weakness (generalized): Secondary | ICD-10-CM | POA: Diagnosis not present

## 2014-08-18 DIAGNOSIS — R296 Repeated falls: Secondary | ICD-10-CM | POA: Diagnosis not present

## 2014-08-18 DIAGNOSIS — R419 Unspecified symptoms and signs involving cognitive functions and awareness: Secondary | ICD-10-CM | POA: Diagnosis not present

## 2014-08-21 DIAGNOSIS — M6281 Muscle weakness (generalized): Secondary | ICD-10-CM | POA: Diagnosis not present

## 2014-08-21 DIAGNOSIS — R296 Repeated falls: Secondary | ICD-10-CM | POA: Diagnosis not present

## 2014-08-21 DIAGNOSIS — R419 Unspecified symptoms and signs involving cognitive functions and awareness: Secondary | ICD-10-CM | POA: Diagnosis not present

## 2014-08-22 DIAGNOSIS — R419 Unspecified symptoms and signs involving cognitive functions and awareness: Secondary | ICD-10-CM | POA: Diagnosis not present

## 2014-08-22 DIAGNOSIS — R296 Repeated falls: Secondary | ICD-10-CM | POA: Diagnosis not present

## 2014-08-22 DIAGNOSIS — M6281 Muscle weakness (generalized): Secondary | ICD-10-CM | POA: Diagnosis not present

## 2014-08-23 DIAGNOSIS — M6281 Muscle weakness (generalized): Secondary | ICD-10-CM | POA: Diagnosis not present

## 2014-08-23 DIAGNOSIS — R296 Repeated falls: Secondary | ICD-10-CM | POA: Diagnosis not present

## 2014-08-23 DIAGNOSIS — R419 Unspecified symptoms and signs involving cognitive functions and awareness: Secondary | ICD-10-CM | POA: Diagnosis not present

## 2014-08-24 DIAGNOSIS — M6281 Muscle weakness (generalized): Secondary | ICD-10-CM | POA: Diagnosis not present

## 2014-08-24 DIAGNOSIS — R296 Repeated falls: Secondary | ICD-10-CM | POA: Diagnosis not present

## 2014-08-24 DIAGNOSIS — R419 Unspecified symptoms and signs involving cognitive functions and awareness: Secondary | ICD-10-CM | POA: Diagnosis not present

## 2014-08-25 DIAGNOSIS — M6281 Muscle weakness (generalized): Secondary | ICD-10-CM | POA: Diagnosis not present

## 2014-08-25 DIAGNOSIS — R419 Unspecified symptoms and signs involving cognitive functions and awareness: Secondary | ICD-10-CM | POA: Diagnosis not present

## 2014-08-25 DIAGNOSIS — R296 Repeated falls: Secondary | ICD-10-CM | POA: Diagnosis not present

## 2014-08-28 DIAGNOSIS — M6281 Muscle weakness (generalized): Secondary | ICD-10-CM | POA: Diagnosis not present

## 2014-08-28 DIAGNOSIS — R419 Unspecified symptoms and signs involving cognitive functions and awareness: Secondary | ICD-10-CM | POA: Diagnosis not present

## 2014-08-28 DIAGNOSIS — R296 Repeated falls: Secondary | ICD-10-CM | POA: Diagnosis not present

## 2014-09-04 ENCOUNTER — Encounter: Payer: Self-pay | Admitting: *Deleted

## 2014-09-04 ENCOUNTER — Emergency Department: Payer: Medicare Other

## 2014-09-04 ENCOUNTER — Telehealth: Payer: Self-pay | Admitting: Internal Medicine

## 2014-09-04 ENCOUNTER — Inpatient Hospital Stay
Admission: EM | Admit: 2014-09-04 | Discharge: 2014-09-08 | DRG: 469 | Disposition: A | Discharge status: Skilled Nursing Facility | Payer: Medicare Other | Attending: Internal Medicine | Admitting: Internal Medicine

## 2014-09-04 DIAGNOSIS — R338 Other retention of urine: Secondary | ICD-10-CM

## 2014-09-04 DIAGNOSIS — Z96641 Presence of right artificial hip joint: Secondary | ICD-10-CM

## 2014-09-04 DIAGNOSIS — F0390 Unspecified dementia without behavioral disturbance: Secondary | ICD-10-CM | POA: Diagnosis not present

## 2014-09-04 DIAGNOSIS — Z77090 Contact with and (suspected) exposure to asbestos: Secondary | ICD-10-CM | POA: Diagnosis present

## 2014-09-04 DIAGNOSIS — W19XXXA Unspecified fall, initial encounter: Secondary | ICD-10-CM | POA: Diagnosis not present

## 2014-09-04 DIAGNOSIS — Z888 Allergy status to other drugs, medicaments and biological substances status: Secondary | ICD-10-CM

## 2014-09-04 DIAGNOSIS — I10 Essential (primary) hypertension: Secondary | ICD-10-CM | POA: Diagnosis not present

## 2014-09-04 DIAGNOSIS — T148 Other injury of unspecified body region: Secondary | ICD-10-CM | POA: Diagnosis not present

## 2014-09-04 DIAGNOSIS — K5190 Ulcerative colitis, unspecified, without complications: Secondary | ICD-10-CM

## 2014-09-04 DIAGNOSIS — Y92239 Unspecified place in hospital as the place of occurrence of the external cause: Secondary | ICD-10-CM | POA: Diagnosis not present

## 2014-09-04 DIAGNOSIS — M109 Gout, unspecified: Secondary | ICD-10-CM | POA: Diagnosis not present

## 2014-09-04 DIAGNOSIS — M25559 Pain in unspecified hip: Secondary | ICD-10-CM | POA: Diagnosis not present

## 2014-09-04 DIAGNOSIS — M6281 Muscle weakness (generalized): Secondary | ICD-10-CM | POA: Diagnosis not present

## 2014-09-04 DIAGNOSIS — J189 Pneumonia, unspecified organism: Secondary | ICD-10-CM | POA: Diagnosis not present

## 2014-09-04 DIAGNOSIS — Z951 Presence of aortocoronary bypass graft: Secondary | ICD-10-CM

## 2014-09-04 DIAGNOSIS — K219 Gastro-esophageal reflux disease without esophagitis: Secondary | ICD-10-CM | POA: Diagnosis present

## 2014-09-04 DIAGNOSIS — Y846 Urinary catheterization as the cause of abnormal reaction of the patient, or of later complication, without mention of misadventure at the time of the procedure: Secondary | ICD-10-CM | POA: Diagnosis not present

## 2014-09-04 DIAGNOSIS — M19019 Primary osteoarthritis, unspecified shoulder: Secondary | ICD-10-CM | POA: Diagnosis present

## 2014-09-04 DIAGNOSIS — Z87891 Personal history of nicotine dependence: Secondary | ICD-10-CM | POA: Diagnosis not present

## 2014-09-04 DIAGNOSIS — Z7982 Long term (current) use of aspirin: Secondary | ICD-10-CM

## 2014-09-04 DIAGNOSIS — Z471 Aftercare following joint replacement surgery: Secondary | ICD-10-CM | POA: Diagnosis not present

## 2014-09-04 DIAGNOSIS — J9811 Atelectasis: Secondary | ICD-10-CM | POA: Diagnosis not present

## 2014-09-04 DIAGNOSIS — S72001A Fracture of unspecified part of neck of right femur, initial encounter for closed fracture: Principal | ICD-10-CM

## 2014-09-04 DIAGNOSIS — Z79899 Other long term (current) drug therapy: Secondary | ICD-10-CM | POA: Diagnosis not present

## 2014-09-04 DIAGNOSIS — R509 Fever, unspecified: Secondary | ICD-10-CM | POA: Diagnosis not present

## 2014-09-04 DIAGNOSIS — I2510 Atherosclerotic heart disease of native coronary artery without angina pectoris: Secondary | ICD-10-CM | POA: Diagnosis present

## 2014-09-04 DIAGNOSIS — Z66 Do not resuscitate: Secondary | ICD-10-CM | POA: Diagnosis present

## 2014-09-04 DIAGNOSIS — F0280 Dementia in other diseases classified elsewhere without behavioral disturbance: Secondary | ICD-10-CM | POA: Diagnosis present

## 2014-09-04 DIAGNOSIS — S72009A Fracture of unspecified part of neck of unspecified femur, initial encounter for closed fracture: Secondary | ICD-10-CM | POA: Diagnosis present

## 2014-09-04 DIAGNOSIS — N183 Chronic kidney disease, stage 3 (moderate): Secondary | ICD-10-CM | POA: Diagnosis present

## 2014-09-04 DIAGNOSIS — K5730 Diverticulosis of large intestine without perforation or abscess without bleeding: Secondary | ICD-10-CM | POA: Diagnosis present

## 2014-09-04 DIAGNOSIS — I951 Orthostatic hypotension: Secondary | ICD-10-CM | POA: Diagnosis not present

## 2014-09-04 DIAGNOSIS — R5081 Fever presenting with conditions classified elsewhere: Secondary | ICD-10-CM

## 2014-09-04 DIAGNOSIS — S72001D Fracture of unspecified part of neck of right femur, subsequent encounter for closed fracture with routine healing: Secondary | ICD-10-CM | POA: Diagnosis not present

## 2014-09-04 DIAGNOSIS — N99820 Postprocedural hemorrhage and hematoma of a genitourinary system organ or structure following a genitourinary system procedure: Secondary | ICD-10-CM | POA: Diagnosis not present

## 2014-09-04 DIAGNOSIS — I129 Hypertensive chronic kidney disease with stage 1 through stage 4 chronic kidney disease, or unspecified chronic kidney disease: Secondary | ICD-10-CM | POA: Diagnosis present

## 2014-09-04 DIAGNOSIS — G309 Alzheimer's disease, unspecified: Secondary | ICD-10-CM | POA: Diagnosis present

## 2014-09-04 DIAGNOSIS — W050XXA Fall from non-moving wheelchair, initial encounter: Secondary | ICD-10-CM | POA: Diagnosis present

## 2014-09-04 DIAGNOSIS — R419 Unspecified symptoms and signs involving cognitive functions and awareness: Secondary | ICD-10-CM | POA: Diagnosis not present

## 2014-09-04 DIAGNOSIS — S129XXA Fracture of neck, unspecified, initial encounter: Secondary | ICD-10-CM | POA: Diagnosis not present

## 2014-09-04 DIAGNOSIS — R339 Retention of urine, unspecified: Secondary | ICD-10-CM | POA: Diagnosis present

## 2014-09-04 LAB — CBC WITH DIFFERENTIAL/PLATELET
BASOS ABS: 0 10*3/uL (ref 0–0.1)
BASOS PCT: 0 %
EOS ABS: 0.4 10*3/uL (ref 0–0.7)
EOS PCT: 4 %
HCT: 39.2 % — ABNORMAL LOW (ref 40.0–52.0)
Hemoglobin: 12.7 g/dL — ABNORMAL LOW (ref 13.0–18.0)
Lymphocytes Relative: 7 %
Lymphs Abs: 0.8 10*3/uL — ABNORMAL LOW (ref 1.0–3.6)
MCH: 29.7 pg (ref 26.0–34.0)
MCHC: 32.3 g/dL (ref 32.0–36.0)
MCV: 91.8 fL (ref 80.0–100.0)
MONO ABS: 0.6 10*3/uL (ref 0.2–1.0)
Monocytes Relative: 5 %
NEUTROS ABS: 8.9 10*3/uL — AB (ref 1.4–6.5)
Neutrophils Relative %: 84 %
PLATELETS: 134 10*3/uL — AB (ref 150–440)
RBC: 4.27 MIL/uL — ABNORMAL LOW (ref 4.40–5.90)
RDW: 15.6 % — AB (ref 11.5–14.5)
WBC: 10.6 10*3/uL (ref 3.8–10.6)

## 2014-09-04 LAB — BASIC METABOLIC PANEL
ANION GAP: 7 (ref 5–15)
BUN: 32 mg/dL — ABNORMAL HIGH (ref 6–20)
CALCIUM: 8.9 mg/dL (ref 8.9–10.3)
CO2: 30 mmol/L (ref 22–32)
Chloride: 101 mmol/L (ref 101–111)
Creatinine, Ser: 1.2 mg/dL (ref 0.61–1.24)
GFR calc Af Amer: 58 mL/min — ABNORMAL LOW (ref >60)
GFR, EST NON AFRICAN AMERICAN: 50 mL/min — AB (ref >60)
GLUCOSE: 87 mg/dL (ref 65–99)
Potassium: 4.3 mmol/L (ref 3.5–5.1)
Sodium: 138 mmol/L (ref 135–145)

## 2014-09-04 LAB — PROTIME-INR
INR: 1.2
PROTHROMBIN TIME: 15.4 s — AB (ref 11.4–15.0)

## 2014-09-04 MED ORDER — MORPHINE SULFATE (PF) 2 MG/ML IV SOLN
2.0000 mg | INTRAVENOUS | Status: DC | PRN
  Administered 2014-09-05 – 2014-09-07 (×5): 2 mg via INTRAVENOUS
  Filled 2014-09-04 (×5): qty 1

## 2014-09-04 MED ORDER — ONDANSETRON HCL 4 MG/2ML IJ SOLN
4.0000 mg | Freq: Once | INTRAMUSCULAR | Status: AC
Start: 2014-09-04 — End: 2014-09-04
  Administered 2014-09-04: 4 mg via INTRAVENOUS
  Filled 2014-09-04: qty 2

## 2014-09-04 MED ORDER — SODIUM CHLORIDE 0.9 % IV SOLN
INTRAVENOUS | Status: DC
  Administered 2014-09-05: 15:00:00 via INTRAVENOUS
  Administered 2014-09-05: 75 mL/h via INTRAVENOUS
  Administered 2014-09-06: 13:00:00 via INTRAVENOUS

## 2014-09-04 MED ORDER — POLYETHYLENE GLYCOL 3350 17 G PO PACK
17.0000 g | PACK | Freq: Every day | ORAL | Status: DC | PRN
  Administered 2014-09-06 – 2014-09-07 (×2): 17 g via ORAL
  Filled 2014-09-04 (×3): qty 1

## 2014-09-04 MED ORDER — ACETAMINOPHEN 650 MG RE SUPP: 650.0000 mg | Freq: Four times a day (QID) | RECTAL | Status: DC | PRN

## 2014-09-04 MED ORDER — ACETAMINOPHEN 325 MG PO TABS: 650.0000 mg | ORAL_TABLET | Freq: Four times a day (QID) | ORAL | Status: DC | PRN

## 2014-09-04 MED ORDER — OXYCODONE HCL 5 MG PO TABS: 5.0000 mg | ORAL_TABLET | ORAL | Status: DC | PRN

## 2014-09-04 MED ORDER — ONDANSETRON HCL 4 MG/2ML IJ SOLN: 4.0000 mg | Freq: Four times a day (QID) | INTRAMUSCULAR | Status: DC | PRN

## 2014-09-04 MED ORDER — ONDANSETRON HCL 4 MG PO TABS: 4.0000 mg | ORAL_TABLET | Freq: Four times a day (QID) | ORAL | Status: DC | PRN

## 2014-09-04 MED ORDER — DOCUSATE SODIUM 100 MG PO CAPS: 100.0000 mg | ORAL_CAPSULE | Freq: Two times a day (BID) | ORAL | Status: DC

## 2014-09-04 MED ORDER — HALOPERIDOL LACTATE 5 MG/ML IJ SOLN
5.0000 mg | Freq: Once | INTRAMUSCULAR | Status: AC
  Administered 2014-09-04: 5 mg via INTRAVENOUS
  Filled 2014-09-04: qty 1

## 2014-09-04 MED ORDER — HYDROMORPHONE HCL 1 MG/ML IJ SOLN
1.0000 mg | Freq: Once | INTRAMUSCULAR | Status: AC
  Administered 2014-09-04: 1 mg via INTRAVENOUS
  Filled 2014-09-04: qty 1

## 2014-09-04 MED ORDER — MORPHINE SULFATE (PF) 2 MG/ML IV SOLN
2.0000 mg | Freq: Once | INTRAVENOUS | Status: AC
  Administered 2014-09-04: 2 mg via INTRAVENOUS
  Filled 2014-09-04: qty 1

## 2014-09-04 NOTE — Telephone Encounter (Signed)
Patient sent to ER for evaluation of hip pain after fall. Per staff he is screaming in pain and unable to bear weight

## 2014-09-04 NOTE — ED Notes (Signed)
Pt arrived to ED via EMS from [facility]. Pt had an unwitnessed fall and is now reporting right hip pain. No shortening of the right leg noted and no pain reported upon palpation of the area. Pt does report pain with movement of the right leg.

## 2014-09-04 NOTE — ED Notes (Signed)
Pt placed on [place] 2LPM due to sedation.

## 2014-09-04 NOTE — ED Provider Notes (Signed)
Time Seen: Approximately 2030 I have reviewed the triage notes  Chief Complaint: Fall   History of Present Illness: Dustin Byrd is a 81 y.o. male who presents after a fall at a skilled nursing facility. Patient gradually over time has had increased difficulty with ambulation. He most likely fell out of his wheelchair or bed height. Patient himself has a history of dementia and cannot recall any events of the fall. Denies any significant pain other than his right hip. The daughters with the patient's and states he does have DO NOT RESUSCITATE status. She does not know any other events surrounding his fall. She doesn't feel that there was any obvious syncopal episode or head trauma.   Past Medical History  Diagnosis Date  . Gout   . CAD (coronary artery disease)   . DJD of shoulder   . UC (ulcerative colitis)   . Asbestos exposure   . Hypercholesterolemia   . Esophageal stricture   . Orthostatic hypotension   . Personal history of colonic polyps 1991    villous adenoma of cecum  . Memory loss   . 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 Chronic kidney disease, stage III (moderate)   . Encounter for long-term (current) use of other medications   . Diverticulosis of colon (without mention of hemorrhage)   . Anxiety   . Colon cancer 1994    Patient Active Problem List   Diagnosis Date Noted  . Orthostatic hypotension 09/29/2013  . Irritant dermatitis 12/23/2012  . Dementia, unspecified, without behavioral disturbance 05/01/2011  . Constipation, chronic 05/01/2011  . Gout, unspecified 05/01/2011  . Episodic mood disorder   . COLON CANCER 01/29/2007  . PROSTATE CANCER 01/29/2007  . BLADDER CANCER 01/29/2007  . HYPERLIPIDEMIA 01/29/2007  . THROMBOCYTOPENIA 01/29/2007  . HYPERTENSION 01/29/2007  . CAD 01/29/2007  . COLITIS, ULCERATIVE 01/29/2007  . DIVERTICULOSIS, COLON 01/29/2007    Past Surgical History   Procedure Laterality Date  . Hemicolectomy  1991    right  . Colostomy takedown  1996  . Open heart surgery      CABG x4  . Coronary artery bypass graft      x4    Past Surgical History  Procedure Laterality Date  . Hemicolectomy  1991    right  . Colostomy takedown  1996  . Open heart surgery      CABG x4  . Coronary artery bypass graft      x4    Current Outpatient Rx  Name  Route  Sig  Dispense  Refill  . allopurinol (ZYLOPRIM) 100 MG tablet      TAKE 1 TABLET BY MOUTH ONCE DAILY FOR GOUT   30 tablet   11   . aspirin 81 MG chewable tablet      TAKE 1 TABLET BY MOUTH EACH DAY. ANTI PLATELET AGGREGATION   30 tablet   11   . citalopram (CELEXA) 10 MG tablet   Oral   Take 10 mg by mouth daily.         Marland Kitchen COLCRYS 0.6 MG tablet      TAKE 1 TABLET BY MOUTH ONCE DAILY FOR GOUT   30 tablet   11   . Dermatological Products, Misc. (ITCH-ENDER!) LOTN      APPLY TO ULCER AREA 3 TIMES A DAY AS DIRECTED.   120 mL   5   . fludrocortisone (FLORINEF) 0.1 MG tablet  Oral   Take 0.1 mg by mouth daily.         . fluticasone (FLONASE) 50 MCG/ACT nasal spray   Each Nare   Place 2 sprays into both nostrils daily.         Marland Kitchen loratadine (CLARITIN) 10 MG tablet      Take 1 tab BID x 5 days then daily prn thereafter   10 tablet   0   . LORazepam (ATIVAN) 0.5 MG tablet   Oral   Take 0.25 mg by mouth 3 (three) times daily as needed.         . nitroGLYCERIN (NITROSTAT) 0.4 MG SL tablet   Sublingual   Place 1 tablet (0.4 mg total) under the tongue every 5 (five) minutes as needed. For chest pain. Call 911 after 3 doses if no relief.   25 tablet   1   . omeprazole (PRILOSEC) 20 MG capsule      TAKE 1 CAPSULE BY MOUTH ONCE A DAY. (G.E.R.D.) DO NOT CRUSH   30 capsule   11   . PENTASA 250 MG CR capsule      TAKE 1 CAPSULE BY MOUTH FOUR TIMES A DAY FOR ULCERATIVE COLITIS.   120 capsule   11   . polyethylene glycol (MIRALAX / GLYCOLAX) packet   Oral    Take 17 g by mouth 2 (two) times daily.   200 each   3   . sennosides-docusate sodium (SENOKOT-S) 8.6-50 MG tablet      TAKE 2 TABLETS BY MOUTH TWICE A DAY FOR STOOL SOFTENER. TAKE WITH GLASS OF WATER.   120 tablet   11   . SILVADENE 1 % cream      APPLY EVERY DAY TO ULCER UNTIL HEALED   400 g   0     Allergies:  Aricept; Cortisone; and Prednisone  Family History: Family History  Problem Relation Age of Onset  . Diabetes Father   . Bipolar disorder Daughter   . Stroke Mother     Social History: Social History  Substance Use Topics  . Smoking status: Former Research scientist (life sciences)  . Smokeless tobacco: Never Used  . Alcohol Use: Yes     Comment: 1-2 oz Scotch per night     Review of Systems:   10 point review of systems was performed and was otherwise negative:  Constitutional: No fever Eyes: No visual disturbances ENT: No sore throat, ear pain Cardiac: No chest pain Respiratory: No shortness of breath, wheezing, or stridor Abdomen: No abdominal pain, no vomiting, No diarrhea Endocrine: No weight loss, No night sweats Extremities: No other significant pain in the upper extremities No peripheral edema, cyanosis Skin: No rashes, easy bruising Neurologic: No focal weakness, trouble with speech or swollowing Urologic: No dysuria, Hematuria, or urinary frequency   Physical Exam:  ED Triage Vitals  Enc Vitals Group     BP 09/04/14 1950 138/69 mmHg     Pulse Rate 09/04/14 1950 73     Resp 09/04/14 1950 16     Temp 09/04/14 1950 99.6 F (37.6 C)     Temp Source 09/04/14 1950 Oral     SpO2 09/04/14 1950 96 %     Weight 09/04/14 1950 148 lb 5.9 oz (67.3 kg)     Height 09/04/14 1950 6' (1.829 m)     Head Cir --      Peak Flow --      Pain Score --      Pain Loc --  Pain Edu? --      Excl. in [place]? --     General: Awake , Alert , and Oriented times 1 cooperative GCS 15 Head: Normal cephalic , atraumatic Eyes: Pupils equal , round, reactive to light Nose/Throat: No  nasal drainage, patent upper airway without erythema or exudate.  Neck: Supple, Full range of motion, No anterior adenopathy or palpable thyroid masses Lungs: Clear to ascultation without wheezes , rhonchi, or rales Heart: Regular rate, regular rhythm without murmurs , gallops , or rubs Abdomen: Soft, non tender without rebound, guarding , or rigidity; bowel sounds positive and symmetric in all 4 quadrants. No organomegaly .        Extremities: Mild shortening of the right lower extremity with pain reproduced with inversion and eversion at the right hip. Extremities otherwise neurovascularly intact Neurologic: normal ambulation, Motor symmetric without deficits, sensory intact Skin: warm, dry, no rashes   Labs:   All laboratory work was reviewed including any pertinent negatives or positives listed below:  Labs Reviewed  BASIC METABOLIC PANEL - Abnormal; Notable for the following:    BUN 32 (*)    GFR calc non Af Amer 50 (*)    GFR calc Af Amer 58 (*)    All other components within normal limits  CBC WITH DIFFERENTIAL/PLATELET - Abnormal; Notable for the following:    RBC 4.27 (*)    Hemoglobin 12.7 (*)    HCT 39.2 (*)    RDW 15.6 (*)    Platelets 134 (*)    Neutro Abs 8.9 (*)    Lymphs Abs 0.8 (*)    All other components within normal limits  PROTIME-INR - Abnormal; Notable for the following:    Prothrombin Time 15.4 (*)    All other components within normal limits    EKG: ED ECG REPORT I, Daymon Larsen, the attending physician, personally viewed and interpreted this ECG.   Date: 09/04/2014  EKG Time: 2159  Rate: 80  Rhythm: Normal sinus Axis: Normal  Intervals: Normal  ST&T Change: None Left atrial enlargement otherwise normal EKG   Radiology:   EXAM: PORTABLE CHEST - 1 VIEW  COMPARISON: 04/25/2014  FINDINGS: Stable mild cardiac enlargement. Patient is status post CABG.  Stable calcified 1 cm nodule left upper lobe.  Progressive architectural distortion  right lung apex. Mild bibasilar opacities suggesting atelectasis.  IMPRESSION: Bibasilar atelectasis. Progressive architectural distortion right apex which could be postsurgical if there has been thoracotomy. As suggested on report of prior chest radiograph performed 04/25/2014 if there is no history of surgery recently been CT thorax would be suggested. Otherwise would suggest follow-up chest radiographs in about 3 months.   Electronically Signed By: Skipper Cliche M.D. On: 09/04/2014 21:58          DG HIP UNILAT WITH PELVIS 2-3 VIEWS RIGHT (Final result) Result time: 09/04/14 21:59:27   Final result by Rad Results In Interface (09/04/14 21:59:27)   Narrative:   CLINICAL DATA: Right hip pain, fall  EXAM: DG HIP (WITH OR WITHOUT PELVIS) 2-3V RIGHT  COMPARISON: None.  FINDINGS: There is a mildly impacted right femoral neck fracture. The femoral head is appropriately located. Normal visualized bowel gas pattern. Vascular calcifications are noted.  IMPRESSION: Mildly impacted acute right femoral neck fracture.        I personally reviewed the radiologic studies   Procedures: None   Critical Care: None    ED Course: Patient had IV access was given morphine for pain control from that clinically  appear to be a right hip fracture. He otherwise is stable and doesn't appear to have any significant injuries from his fall. Preoperative lab work and EKG evaluation along with chest x-ray been initiated. Patient while consultation with orthopedics unassigned along with the hospitalist team for admission.    Assessment: Right femoral neck fracture      Plan: Inpatient management          Go to top  Daymon Larsen, MD 09/04/14 2235

## 2014-09-05 ENCOUNTER — Inpatient Hospital Stay: Payer: Medicare Other

## 2014-09-05 ENCOUNTER — Inpatient Hospital Stay: Payer: Medicare Other | Admitting: Anesthesiology

## 2014-09-05 ENCOUNTER — Telehealth: Payer: Self-pay

## 2014-09-05 ENCOUNTER — Encounter
Admission: EM | Disposition: A | Discharge status: Skilled Nursing Facility | Payer: Self-pay | Source: Home / Self Care | Attending: Internal Medicine

## 2014-09-05 HISTORY — PX: HIP ARTHROPLASTY: SHX981

## 2014-09-05 LAB — BASIC METABOLIC PANEL
ANION GAP: 6 (ref 5–15)
BUN: 32 mg/dL — ABNORMAL HIGH (ref 6–20)
CALCIUM: 8.4 mg/dL — AB (ref 8.9–10.3)
CO2: 30 mmol/L (ref 22–32)
CREATININE: 1.14 mg/dL (ref 0.61–1.24)
Chloride: 103 mmol/L (ref 101–111)
GFR, EST NON AFRICAN AMERICAN: 53 mL/min — AB (ref >60)
Glucose, Bld: 106 mg/dL — ABNORMAL HIGH (ref 65–99)
Potassium: 3.9 mmol/L (ref 3.5–5.1)
SODIUM: 139 mmol/L (ref 135–145)

## 2014-09-05 LAB — CBC
HCT: 36.3 % — ABNORMAL LOW (ref 40.0–52.0)
HEMOGLOBIN: 11.8 g/dL — AB (ref 13.0–18.0)
MCH: 30 pg (ref 26.0–34.0)
MCHC: 32.6 g/dL (ref 32.0–36.0)
MCV: 92 fL (ref 80.0–100.0)
PLATELETS: 111 10*3/uL — AB (ref 150–440)
RBC: 3.95 MIL/uL — AB (ref 4.40–5.90)
RDW: 15.7 % — ABNORMAL HIGH (ref 11.5–14.5)
WBC: 11.3 10*3/uL — AB (ref 3.8–10.6)

## 2014-09-05 LAB — SURGICAL PCR SCREEN
MRSA, PCR: NEGATIVE
STAPHYLOCOCCUS AUREUS: NEGATIVE

## 2014-09-05 SURGERY — HEMIARTHROPLASTY, HIP, DIRECT ANTERIOR APPROACH, FOR FRACTURE
Anesthesia: Spinal | Laterality: Right | Wound class: Clean

## 2014-09-05 MED ORDER — COLCHICINE 0.6 MG PO TABS
0.6000 mg | ORAL_TABLET | Freq: Every day | ORAL | Status: DC
  Administered 2014-09-06 – 2014-09-08 (×3): 0.6 mg via ORAL
  Filled 2014-09-05 (×3): qty 1

## 2014-09-05 MED ORDER — PHENYLEPHRINE HCL 10 MG/ML IJ SOLN
INTRAMUSCULAR | Status: DC | PRN
Start: 2014-09-05 — End: 2014-09-05
  Administered 2014-09-05: 100 ug via INTRAVENOUS

## 2014-09-05 MED ORDER — PANTOPRAZOLE SODIUM 40 MG PO TBEC
40.0000 mg | DELAYED_RELEASE_TABLET | Freq: Two times a day (BID) | ORAL | Status: DC
  Administered 2014-09-05 – 2014-09-08 (×6): 40 mg via ORAL
  Filled 2014-09-05 (×5): qty 1

## 2014-09-05 MED ORDER — TRANEXAMIC ACID 1000 MG/10ML IV SOLN
INTRAVENOUS | Status: AC
  Filled 2014-09-05: qty 10

## 2014-09-05 MED ORDER — TRANEXAMIC ACID 1000 MG/10ML IV SOLN
INTRAVENOUS | Status: DC | PRN
  Administered 2014-09-05: 1000 mg via TOPICAL

## 2014-09-05 MED ORDER — PHENYLEPHRINE HCL 10 MG/ML IJ SOLN
INTRAMUSCULAR | Status: AC
  Filled 2014-09-05: qty 1

## 2014-09-05 MED ORDER — ACETAMINOPHEN 650 MG RE SUPP: 650.0000 mg | Freq: Four times a day (QID) | RECTAL | Status: DC | PRN

## 2014-09-05 MED ORDER — PANTOPRAZOLE SODIUM 40 MG PO TBEC
40.0000 mg | DELAYED_RELEASE_TABLET | Freq: Every day | ORAL | Status: DC
Start: 2014-09-05 — End: 2014-09-05

## 2014-09-05 MED ORDER — SODIUM CHLORIDE 0.9 % IV SOLN
10000.0000 ug | INTRAVENOUS | Status: DC | PRN
  Administered 2014-09-05: 5 ug/min via INTRAVENOUS

## 2014-09-05 MED ORDER — HYDROMORPHONE HCL 1 MG/ML IJ SOLN: 0.5000 mg | INTRAMUSCULAR | Status: DC | PRN

## 2014-09-05 MED ORDER — FLEET ENEMA 7-19 GM/118ML RE ENEM: 1.0000 | ENEMA | Freq: Once | RECTAL | Status: DC | PRN

## 2014-09-05 MED ORDER — FENTANYL CITRATE (PF) 100 MCG/2ML IJ SOLN
INTRAMUSCULAR | Status: AC
  Filled 2014-09-05: qty 2

## 2014-09-05 MED ORDER — NEOMYCIN-POLYMYXIN B GU 40-200000 IR SOLN
Status: AC
  Filled 2014-09-05: qty 20

## 2014-09-05 MED ORDER — METOCLOPRAMIDE HCL 5 MG/ML IJ SOLN: 5.0000 mg | Freq: Three times a day (TID) | INTRAMUSCULAR | Status: DC | PRN

## 2014-09-05 MED ORDER — FLUDROCORTISONE ACETATE 0.1 MG PO TABS
0.1000 mg | ORAL_TABLET | ORAL | Status: DC
  Administered 2014-09-06 – 2014-09-08 (×3): 0.1 mg via ORAL
  Filled 2014-09-05 (×5): qty 1

## 2014-09-05 MED ORDER — MESALAMINE ER 250 MG PO CPCR
250.0000 mg | ORAL_CAPSULE | Freq: Four times a day (QID) | ORAL | Status: DC
  Administered 2014-09-05 – 2014-09-08 (×9): 250 mg via ORAL
  Filled 2014-09-05 (×23): qty 1

## 2014-09-05 MED ORDER — METOCLOPRAMIDE HCL 5 MG PO TABS
5.0000 mg | ORAL_TABLET | Freq: Three times a day (TID) | ORAL | Status: DC | PRN
Start: 2014-09-05 — End: 2014-09-08

## 2014-09-05 MED ORDER — CITALOPRAM HYDROBROMIDE 20 MG PO TABS
20.0000 mg | ORAL_TABLET | ORAL | Status: DC
  Administered 2014-09-06 – 2014-09-08 (×3): 20 mg via ORAL
  Filled 2014-09-05 (×4): qty 1

## 2014-09-05 MED ORDER — DIPHENHYDRAMINE HCL 12.5 MG/5ML PO ELIX: 12.5000 mg | ORAL_SOLUTION | ORAL | Status: DC | PRN

## 2014-09-05 MED ORDER — BUPIVACAINE-EPINEPHRINE (PF) 0.25% -1:200000 IJ SOLN
INTRAMUSCULAR | Status: DC | PRN
  Administered 2014-09-05: 30 mL

## 2014-09-05 MED ORDER — ONDANSETRON HCL 4 MG/2ML IJ SOLN: 4.0000 mg | Freq: Once | INTRAMUSCULAR | Status: DC | PRN

## 2014-09-05 MED ORDER — ACETAMINOPHEN 325 MG PO TABS: 650.0000 mg | ORAL_TABLET | Freq: Four times a day (QID) | ORAL | Status: DC | PRN

## 2014-09-05 MED ORDER — DOCUSATE SODIUM 100 MG PO CAPS
100.0000 mg | ORAL_CAPSULE | Freq: Two times a day (BID) | ORAL | Status: DC
  Administered 2014-09-05 – 2014-09-08 (×4): 100 mg via ORAL
  Filled 2014-09-05 (×3): qty 1

## 2014-09-05 MED ORDER — HYDRALAZINE HCL 20 MG/ML IJ SOLN: 10.0000 mg | INTRAMUSCULAR | Status: DC | PRN

## 2014-09-05 MED ORDER — LORATADINE 10 MG PO TABS
10.0000 mg | ORAL_TABLET | Freq: Every day | ORAL | Status: DC
  Administered 2014-09-06 – 2014-09-08 (×3): 10 mg via ORAL
  Filled 2014-09-05 (×3): qty 1

## 2014-09-05 MED ORDER — PROPOFOL 10 MG/ML IV BOLUS
INTRAVENOUS | Status: DC | PRN
  Administered 2014-09-05 (×2): 20 mg via INTRAVENOUS
  Administered 2014-09-05: 30 mg via INTRAVENOUS
  Administered 2014-09-05 (×2): 10 mg via INTRAVENOUS

## 2014-09-05 MED ORDER — CEFAZOLIN SODIUM-DEXTROSE 2-3 GM-% IV SOLR
2.0000 g | Freq: Four times a day (QID) | INTRAVENOUS | Status: AC
Start: 2014-09-05 — End: 2014-09-06
  Administered 2014-09-05 – 2014-09-06 (×3): 2 g via INTRAVENOUS
  Filled 2014-09-05 (×3): qty 50

## 2014-09-05 MED ORDER — BUPIVACAINE LIPOSOME 1.3 % IJ SUSP
INTRAMUSCULAR | Status: AC
  Filled 2014-09-05: qty 20

## 2014-09-05 MED ORDER — ALLOPURINOL 100 MG PO TABS
100.0000 mg | ORAL_TABLET | Freq: Every day | ORAL | Status: DC
  Administered 2014-09-06 – 2014-09-08 (×3): 100 mg via ORAL
  Filled 2014-09-05 (×3): qty 1

## 2014-09-05 MED ORDER — LIDOCAINE HCL 2 % EX GEL
1.0000 "application " | Freq: Once | CUTANEOUS | Status: DC
  Filled 2014-09-05: qty 5

## 2014-09-05 MED ORDER — LORAZEPAM 0.5 MG PO TABS: 0.2500 mg | ORAL_TABLET | Freq: Three times a day (TID) | ORAL | Status: DC | PRN

## 2014-09-05 MED ORDER — BUPIVACAINE-EPINEPHRINE (PF) 0.5% -1:200000 IJ SOLN
INTRAMUSCULAR | Status: AC
  Filled 2014-09-05: qty 30

## 2014-09-05 MED ORDER — ENOXAPARIN SODIUM 40 MG/0.4ML ~~LOC~~ SOLN
40.0000 mg | SUBCUTANEOUS | Status: DC
  Administered 2014-09-06 – 2014-09-08 (×3): 40 mg via SUBCUTANEOUS
  Filled 2014-09-05 (×3): qty 0.4

## 2014-09-05 MED ORDER — SODIUM CHLORIDE 0.9 % IJ SOLN
INTRAMUSCULAR | Status: AC
  Filled 2014-09-05: qty 50

## 2014-09-05 MED ORDER — CEFAZOLIN SODIUM-DEXTROSE 2-3 GM-% IV SOLR
2.0000 g | INTRAVENOUS | Status: AC
  Administered 2014-09-05: 2 g via INTRAVENOUS
  Filled 2014-09-05: qty 50

## 2014-09-05 MED ORDER — BISACODYL 10 MG RE SUPP
10.0000 mg | Freq: Every day | RECTAL | Status: DC | PRN
  Administered 2014-09-08: 10 mg via RECTAL
  Filled 2014-09-05: qty 1

## 2014-09-05 MED ORDER — ONDANSETRON HCL 4 MG/2ML IJ SOLN: 4.0000 mg | Freq: Four times a day (QID) | INTRAMUSCULAR | Status: DC | PRN

## 2014-09-05 MED ORDER — KCL IN DEXTROSE-NACL 20-5-0.9 MEQ/L-%-% IV SOLN
INTRAVENOUS | Status: DC
  Administered 2014-09-05: 20:00:00 via INTRAVENOUS
  Filled 2014-09-05 (×2): qty 1000

## 2014-09-05 MED ORDER — BUPIVACAINE LIPOSOME 1.3 % IJ SUSP
INTRAMUSCULAR | Status: DC | PRN
  Administered 2014-09-05: 20 mL

## 2014-09-05 MED ORDER — MAGNESIUM HYDROXIDE 400 MG/5ML PO SUSP: 30.0000 mL | Freq: Every day | ORAL | Status: DC | PRN

## 2014-09-05 MED ORDER — FENTANYL CITRATE (PF) 100 MCG/2ML IJ SOLN
25.0000 ug | INTRAMUSCULAR | Status: DC | PRN
  Administered 2014-09-05: 25 ug via INTRAVENOUS

## 2014-09-05 MED ORDER — OXYCODONE HCL 5 MG PO TABS
5.0000 mg | ORAL_TABLET | ORAL | Status: DC | PRN
  Administered 2014-09-06 – 2014-09-07 (×5): 5 mg via ORAL
  Filled 2014-09-05 (×5): qty 1

## 2014-09-05 MED ORDER — NEOMYCIN-POLYMYXIN B GU 40-200000 IR SOLN
Status: DC | PRN
  Administered 2014-09-05: 16 mL

## 2014-09-05 MED ORDER — ONDANSETRON HCL 4 MG PO TABS: 4.0000 mg | ORAL_TABLET | Freq: Four times a day (QID) | ORAL | Status: DC | PRN

## 2014-09-05 MED ORDER — TAMSULOSIN HCL 0.4 MG PO CAPS
0.4000 mg | ORAL_CAPSULE | Freq: Every day | ORAL | Status: DC
  Administered 2014-09-06 – 2014-09-08 (×3): 0.4 mg via ORAL
  Filled 2014-09-05 (×4): qty 1

## 2014-09-05 SURGICAL SUPPLY — 53 items
BAG DECANTER FOR FLEXI CONT (MISCELLANEOUS) ×3 IMPLANT
BLADE SAGITTAL WIDE XTHICK NO (BLADE) ×3 IMPLANT
BLADE SURG SZ20 CARB STEEL (BLADE) ×3 IMPLANT
BNDG COHESIVE 6X5 TAN STRL LF (GAUZE/BANDAGES/DRESSINGS) ×3 IMPLANT
BOWL CEMENT MIXING ADV NOZZLE (MISCELLANEOUS) ×3 IMPLANT
CANISTER SUCT 1200ML W/VALVE (MISCELLANEOUS) ×3 IMPLANT
CAPT HIP HEMI 2 ×2 IMPLANT
CHLORAPREP W/TINT 26ML (MISCELLANEOUS) ×6 IMPLANT
DECANTER SPIKE VIAL GLASS SM (MISCELLANEOUS) ×6 IMPLANT
DRAPE IMP U-DRAPE 54X76 (DRAPES) ×6 IMPLANT
DRAPE INCISE IOBAN 66X60 STRL (DRAPES) ×3 IMPLANT
DRAPE SHEET LG 3/4 BI-LAMINATE (DRAPES) ×3 IMPLANT
DRAPE SURG 17X23 STRL (DRAPES) ×3 IMPLANT
DRSG OPSITE POSTOP 4X12 (GAUZE/BANDAGES/DRESSINGS) ×3 IMPLANT
DRSG OPSITE POSTOP 4X14 (GAUZE/BANDAGES/DRESSINGS) ×3 IMPLANT
DRSG OPSITE POSTOP 4X8 (GAUZE/BANDAGES/DRESSINGS) ×2 IMPLANT
ELECT BLADE 6.5 EXT (BLADE) ×3 IMPLANT
ELECT CAUTERY BLADE 6.4 (BLADE) ×3 IMPLANT
GAUZE PACK 2X3YD (MISCELLANEOUS) ×3 IMPLANT
GLOVE BIO SURGEON STRL SZ8 (GLOVE) ×6 IMPLANT
GLOVE INDICATOR 8.0 STRL GRN (GLOVE) ×3 IMPLANT
GOWN STRL REUS W/ TWL LRG LVL3 (GOWN DISPOSABLE) ×1 IMPLANT
GOWN STRL REUS W/ TWL XL LVL3 (GOWN DISPOSABLE) ×1 IMPLANT
GOWN STRL REUS W/TWL LRG LVL3 (GOWN DISPOSABLE) ×3
GOWN STRL REUS W/TWL XL LVL3 (GOWN DISPOSABLE) ×3
HANDPIECE SUCTION TUBG SURGILV (MISCELLANEOUS) ×3 IMPLANT
IV NS 100ML SINGLE PACK (IV SOLUTION) ×3 IMPLANT
NDL FILTER BLUNT 18X1 1/2 (NEEDLE) ×1 IMPLANT
NDL SAFETY 18GX1.5 (NEEDLE) ×3 IMPLANT
NDL SPNL 20GX3.5 QUINCKE YW (NEEDLE) ×1 IMPLANT
NEEDLE FILTER BLUNT 18X 1/2SAF (NEEDLE) ×2
NEEDLE FILTER BLUNT 18X1 1/2 (NEEDLE) ×1 IMPLANT
NEEDLE SPNL 20GX3.5 QUINCKE YW (NEEDLE) ×3 IMPLANT
NS IRRIG 1000ML POUR BTL (IV SOLUTION) ×3 IMPLANT
PACK HIP PROSTHESIS (MISCELLANEOUS) ×3 IMPLANT
PAD GROUND ADULT SPLIT (MISCELLANEOUS) ×3 IMPLANT
PILLOW ABDUC SM (MISCELLANEOUS) ×3 IMPLANT
STAPLER SKIN PROX 35W (STAPLE) ×3 IMPLANT
STRAP SAFETY BODY (MISCELLANEOUS) ×3 IMPLANT
SUT ETHIBOND #5 BRAIDED 30INL (SUTURE) ×3 IMPLANT
SUT ETHIBOND 2 V 37 (SUTURE) ×6 IMPLANT
SUT ETHIBOND CT1 BRD #0 30IN (SUTURE) ×3 IMPLANT
SUT QUILL PDO 2 24X24 VLT (SUTURE) ×3 IMPLANT
SUT VIC AB 0 CT1 27 (SUTURE) ×6
SUT VIC AB 0 CT1 27XCR 8 STRN (SUTURE) ×2 IMPLANT
SUT VIC AB 1 CT1 36 (SUTURE) ×3 IMPLANT
SUT VIC AB 2-0 CT1 27 (SUTURE) ×6
SUT VIC AB 2-0 CT1 TAPERPNT 27 (SUTURE) ×2 IMPLANT
SYR 30ML LL (SYRINGE) ×3 IMPLANT
SYR TB 1ML 27GX1/2 LL (SYRINGE) ×3 IMPLANT
SYRINGE 10CC LL (SYRINGE) ×3 IMPLANT
TAPE TRANSPORE STRL 2 31045 (GAUZE/BANDAGES/DRESSINGS) ×3 IMPLANT
WATER STERILE IRR 1000ML POUR (IV SOLUTION) ×3 IMPLANT

## 2014-09-05 NOTE — Consult Note (Signed)
ORTHOPAEDIC CONSULTATION  REQUESTING PHYSICIAN: Gladstone Lighter, MD  Chief Complaint:   Right hip pain.  History of Present Illness: Dustin Byrd is a 81 y.o. male who lives in an assisted living facility and has a history of dementia, coronary artery disease, gout, ulcerative colitis, and gastric esophageal reflux disease. Apparently he fell while trying to transfer himself from his wheelchair to his bed, landing on his right hip. He was unable to get up and so was brought to the emergency room where x-rays demonstrated a displaced right femoral neck fracture. The patient did not appear too strike his head or lose consciousness. No other apparent injuries are noted. The patient is not capable of describing exactly what happened himself, so we are unable to determine if there were any predisposing factors such as lightheadedness, dizziness, or chest pain that may have precipitated his fall.  Past Medical History  Diagnosis Date  . Gout   . CAD (coronary artery disease)   . DJD of shoulder   . UC (ulcerative colitis)   . Asbestos exposure   . Hypercholesterolemia   . Esophageal stricture   . Orthostatic hypotension   . Personal history of colonic polyps 1991    villous adenoma of cecum  . Memory loss   . 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 Chronic kidney disease, stage III (moderate)   . Encounter for long-term (current) use of other medications   . Diverticulosis of colon (without mention of hemorrhage)   . Anxiety   . Colon cancer 1994   Past Surgical History  Procedure Laterality Date  . Hemicolectomy  1991    right  . Colostomy takedown  1996  . Open heart surgery      CABG x4  . Coronary artery bypass graft      x4   Social History   Social History  . Marital Status: Widowed    Spouse Name: N/A  . Number of Children: 2  . Years  of Education: N/A   Occupational History  . Textiles--various positions     Retired   Social History Main Topics  . Smoking status: Former Research scientist (life sciences)  . Smokeless tobacco: Never Used  . Alcohol Use: Yes     Comment: 1-2 oz Scotch per night  . Drug Use: No  . Sexual Activity: Not Asked   Other Topics Concern  . None   Social History Narrative   2 daughters      No living will   Daughter Gay Filler has health care POA   Discussed DNR---he requests this ("I want to go in peace")   No tube feedings if cognitively unaware   Family History  Problem Relation Age of Onset  . Diabetes Father   . Bipolar disorder Daughter   . Stroke Mother    Allergies  Allergen Reactions  . Aricept [Donepezil Hydrochloride]     agitation  . Cortisone   . Prednisone     Mania    Prior to Admission medications   Medication Sig Start Date End Date Taking? Authorizing Provider  acetaminophen (TYLENOL) 325 MG tablet Take 650 mg by mouth once as needed for fever (increased pain or temp.).   Yes Historical Provider, MD  allopurinol (ZYLOPRIM) 100 MG tablet TAKE 1 TABLET BY MOUTH ONCE DAILY FOR GOUT 11/07/13  Yes Venia Carbon, MD  aspirin 81 MG chewable tablet TAKE 1 TABLET BY MOUTH EACH DAY. ANTI PLATELET  AGGREGATION 11/07/13  Yes Venia Carbon, MD  citalopram (CELEXA) 20 MG tablet Take 20 mg by mouth every morning.   Yes Historical Provider, MD  COLCRYS 0.6 MG tablet TAKE 1 TABLET BY MOUTH ONCE DAILY FOR GOUT 07/29/12  Yes Venia Carbon, MD  fludrocortisone (FLORINEF) 0.1 MG tablet Take 0.1 mg by mouth every morning.    Yes Historical Provider, MD  fluticasone (FLONASE) 50 MCG/ACT nasal spray Place 2 sprays into both nostrils every evening.    Yes Historical Provider, MD  LORazepam (ATIVAN) 0.5 MG tablet Take 0.25 mg by mouth every morning.  06/07/13  Yes Venia Carbon, MD  LORazepam (ATIVAN) 0.5 MG tablet Take 0.25 mg by mouth 3 (three) times daily as needed for anxiety. 1/2 tab up to 3 times daily  as needed for anxiety   Yes Historical Provider, MD  mineral oil enema Place 1 enema rectally once a week. Once a week as needed for constipation.   Yes Historical Provider, MD  nitroGLYCERIN (NITROSTAT) 0.4 MG SL tablet Place 1 tablet (0.4 mg total) under the tongue every 5 (five) minutes as needed. For chest pain. Call 911 after 3 doses if no relief. 08/05/12  Yes Venia Carbon, MD  omeprazole (PRILOSEC) 20 MG capsule TAKE 1 CAPSULE BY MOUTH ONCE A DAY. (G.E.R.D.) DO NOT CRUSH 11/07/13  Yes Venia Carbon, MD  PENTASA 250 MG CR capsule TAKE 1 CAPSULE BY MOUTH FOUR TIMES A DAY FOR ULCERATIVE COLITIS. 08/05/12  Yes Venia Carbon, MD  polyethylene glycol (MIRALAX / GLYCOLAX) packet Take 17 g by mouth 2 (two) times daily. Patient taking differently: Take 17 g by mouth at bedtime.  10/19/12  Yes Venia Carbon, MD  sennosides-docusate sodium (SENOKOT-S) 8.6-50 MG tablet TAKE 2 TABLETS BY MOUTH TWICE A DAY FOR STOOL SOFTENER. TAKE WITH GLASS OF WATER. 08/12/12  Yes Venia Carbon, MD  Dermatological Products, Misc. (ITCH-ENDER!) LOTN APPLY TO ULCER AREA 3 TIMES A DAY AS DIRECTED. 06/17/12   Venia Carbon, MD  loratadine (CLARITIN) 10 MG tablet Take 1 tab BID x 5 days then daily prn thereafter 12/21/13   Jearld Fenton, NP  SILVADENE 1 % cream APPLY EVERY DAY TO ULCER UNTIL HEALED 12/07/13   Venia Carbon, MD   Dg Chest Portable 1 View  09/04/2014   CLINICAL DATA:  Patient fell at home, right hip pain, preop  EXAM: PORTABLE CHEST - 1 VIEW  COMPARISON:  04/25/2014  FINDINGS: Stable mild cardiac enlargement.  Patient is status post CABG.  Stable calcified 1 cm nodule left upper lobe.  Progressive architectural distortion right lung apex. Mild bibasilar opacities suggesting atelectasis.  IMPRESSION: Bibasilar atelectasis. Progressive architectural distortion right apex which could be postsurgical if there has been thoracotomy. As suggested on report of prior chest radiograph performed 04/25/2014 if  there is no history of surgery recently been CT thorax would be suggested. Otherwise would suggest follow-up chest radiographs in about 3 months.   Electronically Signed   By: Skipper Cliche M.D.   On: 09/04/2014 21:58   Dg Hip Unilat With Pelvis 2-3 Views Right  09/04/2014   CLINICAL DATA:  Right hip pain, fall  EXAM: DG HIP (WITH OR WITHOUT PELVIS) 2-3V RIGHT  COMPARISON:  None.  FINDINGS: There is a mildly impacted right femoral neck fracture. The femoral head is appropriately located. Normal visualized bowel gas pattern. Vascular calcifications are noted.  IMPRESSION: Mildly impacted acute right femoral neck fracture.   Electronically  Signed   By: Conchita Paris M.D.   On: 09/04/2014 21:59    Positive ROS: All other systems have been reviewed and were otherwise negative with the exception of those mentioned in the HPI and as above.  Physical Exam: General:  Alert, no acute distress Psychiatric:  Patient is competent for consent with normal mood and affect   Cardiovascular:  No pedal edema Respiratory:  No wheezing, non-labored breathing GI:  Abdomen is soft and non-tender Skin:  No lesions in the area of chief complaint Neurologic:  Sensation intact distally Lymphatic:  No axillary or cervical lymphadenopathy  Orthopedic Exam:  Orthopedic examination is limited to the right hip and lower extremity. The patient's right lower extremity somewhat shortened and externally rotated as compared to the left. He has pain with any attempted active or passive motion of the right hip or lower extremity. Skin inspection around the right hip is unremarkable. He does have tenderness to palpation over the lateral aspect of the hip in the area of the greater trochanter. He is able to actively dorsiflex and plantarflex his toes and ankle. Sensation appears to be intact to light touch to the right lower extremity and foot. He has good capillary refill to his right foot.  X-rays:  X-rays of the pelvis and  right hip are available for review. The findings are as described above.  Assessment: Displaced right femoral neck fracture.  Plan: The treatment options were discussed with the patient and will be discussed with the patient's daughter. The patient has a displaced right femoral neck fracture and will most likely benefit from a right hip hemiarthroplasty. The risks associated with this procedure including bleeding, infection, nerve and/or blood vessel injury, persistent or recurrent pain, stiffness, dislocation, leg length inequality, need for further surgery, blood clots, strokes, heart attacks and/or arrhythmias, etc. Benefits of this procedure include early mobilization which will reduce the likelihood of blood clots, bedsores, and pneumonia. The nurse will try to obtain consent from the patient's daughter, who is his healthcare proxy.  Thank you for asking me to participate in the care of this most unfortunate man. I will be happy to follow him with you.    Pascal Lux, MD  Beeper #:  [phone]  09/05/2014 9:25 AM

## 2014-09-05 NOTE — Progress Notes (Signed)
Pt bladder scanned, and has 533 ml of urine.  MD Kalesetti notified.

## 2014-09-05 NOTE — Transfer of Care (Signed)
Immediate Anesthesia Transfer of Care Note  Patient: Dustin Byrd  Procedure(s) Performed: Procedure(s): ARTHROPLASTY BIPOLAR HIP (HEMIARTHROPLASTY) (Right)  Patient Location: PACU  Anesthesia Type:Spinal  Level of Consciousness: patient cooperative and lethargic  Airway & Oxygen Therapy: Patient Spontanous Breathing and Patient connected to nasal cannula oxygen  Post-op Assessment: Report given to RN and Post -op Vital signs reviewed and stable  Post vital signs: Reviewed and stable  Last Vitals:  Filed Vitals:   09/05/14 1717  BP: 106/64  Pulse: 73  Temp: 37.6 C  Resp: 16    Complications: No apparent anesthesia complications

## 2014-09-05 NOTE — Progress Notes (Signed)
Bobbye Morton NP is currently on the unit and needing urojet before inserting a foley

## 2014-09-05 NOTE — H&P (Signed)
[facility] [place] NAME: Dustin Byrd    MR#:  [id number]  DATE OF BIRTH:  1918/06/28   DATE OF ADMISSION:  09/04/2014  PRIMARY CARE PHYSICIAN: Viviana Simpler, MD   REQUESTING/REFERRING PHYSICIAN: Marcelene Butte  CHIEF COMPLAINT:   Chief Complaint  Patient presents with  . Fall    HISTORY OF PRESENT ILLNESS:  Dustin Byrd  is a 81 y.o. male with a known history of dementia, coronary artery disease as a CABG presenting after mechanical. Patient is unable to provide meaningful information given mental status/medical condition at baseline. He suffered a fall at his nursing facility it was unwitnessed, but they believe he is trying to self transfer from his wheelchair (which his daughter says he is not supposed to) emergency department course: Found to have right femoral neck fracture   PAST MEDICAL HISTORY:   Past Medical History  Diagnosis Date  . Gout   . CAD (coronary artery disease)   . DJD of shoulder   . UC (ulcerative colitis)   . Asbestos exposure   . Hypercholesterolemia   . Esophageal stricture   . Orthostatic hypotension   . Personal history of colonic polyps 1991    villous adenoma of cecum  . Memory loss   . 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 Chronic kidney disease, stage III (moderate)   . Encounter for long-term (current) use of other medications   . Diverticulosis of colon (without mention of hemorrhage)   . Anxiety   . Colon cancer 1994    PAST SURGICAL HISTORY:   Past Surgical History  Procedure Laterality Date  . Hemicolectomy  1991    right  . Colostomy takedown  1996  . Open heart surgery      CABG x4  . Coronary artery bypass graft      x4    SOCIAL HISTORY:   Social History  Substance Use Topics  . Smoking status: Former Research scientist (life sciences)  . Smokeless tobacco: Never Used  . Alcohol Use: Yes     Comment: 1-2 oz Scotch per  night    FAMILY HISTORY:   Family History  Problem Relation Age of Onset  . Diabetes Father   . Bipolar disorder Daughter   . Stroke Mother     DRUG ALLERGIES:   Allergies  Allergen Reactions  . Aricept [Donepezil Hydrochloride]     agitation  . Cortisone   . Prednisone     Mania     REVIEW OF SYSTEMS:  Unable to obtain given patient's mental status/medical condition  MEDICATIONS AT HOME:   Prior to Admission medications   Medication Sig Start Date End Date Taking? Authorizing Provider  acetaminophen (TYLENOL) 325 MG tablet Take 650 mg by mouth once as needed for fever (increased pain or temp.).   Yes Historical Provider, MD  allopurinol (ZYLOPRIM) 100 MG tablet TAKE 1 TABLET BY MOUTH ONCE DAILY FOR GOUT 11/07/13  Yes Venia Carbon, MD  aspirin 81 MG chewable tablet TAKE 1 TABLET BY MOUTH EACH DAY. ANTI PLATELET AGGREGATION 11/07/13  Yes Venia Carbon, MD  citalopram (CELEXA) 20 MG tablet Take 20 mg by mouth every morning.   Yes Historical Provider, MD  COLCRYS 0.6 MG tablet TAKE 1 TABLET BY MOUTH ONCE DAILY FOR GOUT 07/29/12  Yes Venia Carbon, MD  fludrocortisone (FLORINEF) 0.1 MG tablet Take 0.1 mg by mouth every morning.  Yes Historical Provider, MD  fluticasone (FLONASE) 50 MCG/ACT nasal spray Place 2 sprays into both nostrils every evening.    Yes Historical Provider, MD  LORazepam (ATIVAN) 0.5 MG tablet Take 0.25 mg by mouth every morning.  06/07/13  Yes Venia Carbon, MD  LORazepam (ATIVAN) 0.5 MG tablet Take 0.25 mg by mouth 3 (three) times daily as needed for anxiety. 1/2 tab up to 3 times daily as needed for anxiety   Yes Historical Provider, MD  mineral oil enema Place 1 enema rectally once a week. Once a week as needed for constipation.   Yes Historical Provider, MD  nitroGLYCERIN (NITROSTAT) 0.4 MG SL tablet Place 1 tablet (0.4 mg total) under the tongue every 5 (five) minutes as needed. For chest pain. Call 911 after 3 doses if no relief. 08/05/12  Yes  Venia Carbon, MD  omeprazole (PRILOSEC) 20 MG capsule TAKE 1 CAPSULE BY MOUTH ONCE A DAY. (G.E.R.D.) DO NOT CRUSH 11/07/13  Yes Venia Carbon, MD  PENTASA 250 MG CR capsule TAKE 1 CAPSULE BY MOUTH FOUR TIMES A DAY FOR ULCERATIVE COLITIS. 08/05/12  Yes Venia Carbon, MD  polyethylene glycol (MIRALAX / GLYCOLAX) packet Take 17 g by mouth 2 (two) times daily. Patient taking differently: Take 17 g by mouth at bedtime.  10/19/12  Yes Venia Carbon, MD  sennosides-docusate sodium (SENOKOT-S) 8.6-50 MG tablet TAKE 2 TABLETS BY MOUTH TWICE A DAY FOR STOOL SOFTENER. TAKE WITH GLASS OF WATER. 08/12/12  Yes Venia Carbon, MD  Dermatological Products, Misc. (ITCH-ENDER!) LOTN APPLY TO ULCER AREA 3 TIMES A DAY AS DIRECTED. 06/17/12   Venia Carbon, MD  loratadine (CLARITIN) 10 MG tablet Take 1 tab BID x 5 days then daily prn thereafter 12/21/13   Jearld Fenton, NP  SILVADENE 1 % cream APPLY EVERY DAY TO ULCER UNTIL HEALED 12/07/13   Venia Carbon, MD      VITAL SIGNS:  Blood pressure 176/83, pulse 92, temperature 99.6 F (37.6 C), temperature source Oral, resp. rate 16, height 6' (1.829 m), weight 148 lb 5.9 oz (67.3 kg), SpO2 98 %.  PHYSICAL EXAMINATION:   VITAL SIGNS: Filed Vitals:   09/04/14 2355  BP: 176/83  Pulse: 92  Temp:   Resp: 16   GENERAL:81 y.o.male minimal distress given mental status/pain.  HEAD: Normocephalic, atraumatic.  EYES: Pupils equal, round, reactive to light. Unable to assess extraocular muscles given mental status/medical condition. No scleral icterus.  MOUTH: Moist mucosal membrane. Dentition intact. No abscess noted.  EAR, NOSE, THROAT: Clear without exudates. No external lesions.  NECK: Supple. No thyromegaly. No nodules. No JVD.  PULMONARY: Clear to ascultation, without wheeze rails or rhonci. No use of accessory muscles, Good respiratory effort. good air entry bilaterally CHEST: Nontender to palpation.  CARDIOVASCULAR: S1 and S2. Regular rate and  rhythm. No murmurs, rubs, or gallops. No edema. Pedal pulses 2+ bilaterally.  GASTROINTESTINAL: Soft, nontender, nondistended. No masses. Positive bowel sounds. No hepatosplenomegaly.  MUSCULOSKELETAL: No swelling, clubbing, or edema. Limited Range of motion right lower extremity given fracture NEUROLOGIC: Unable to assess given mental status/medical condition SKIN: No ulceration, lesions, rashes, or cyanosis. Skin warm and dry. Turgor intact.  PSYCHIATRIC: Unable to assess given mental status/medical condition     LABORATORY PANEL:   CBC  Recent Labs Lab 09/04/14 2109  WBC 10.6  HGB 12.7*  HCT 39.2*  PLT 134*   ------------------------------------------------------------------------------------------------------------------  Chemistries   Recent Labs Lab 09/04/14 2109  NA 138  K 4.3  CL 101  CO2 30  GLUCOSE 87  BUN 32*  CREATININE 1.20  CALCIUM 8.9   ------------------------------------------------------------------------------------------------------------------  Cardiac Enzymes No results for input(s): TROPONINI in the last 168 hours. ------------------------------------------------------------------------------------------------------------------  RADIOLOGY:  Dg Chest Portable 1 View  09/04/2014   CLINICAL DATA:  Patient fell at home, right hip pain, preop  EXAM: PORTABLE CHEST - 1 VIEW  COMPARISON:  04/25/2014  FINDINGS: Stable mild cardiac enlargement.  Patient is status post CABG.  Stable calcified 1 cm nodule left upper lobe.  Progressive architectural distortion right lung apex. Mild bibasilar opacities suggesting atelectasis.  IMPRESSION: Bibasilar atelectasis. Progressive architectural distortion right apex which could be postsurgical if there has been thoracotomy. As suggested on report of prior chest radiograph performed 04/25/2014 if there is no history of surgery recently been CT thorax would be suggested. Otherwise would suggest follow-up chest  radiographs in about 3 months.   Electronically Signed   By: Skipper Cliche M.D.   On: 09/04/2014 21:58   Dg Hip Unilat With Pelvis 2-3 Views Right  09/04/2014   CLINICAL DATA:  Right hip pain, fall  EXAM: DG HIP (WITH OR WITHOUT PELVIS) 2-3V RIGHT  COMPARISON:  None.  FINDINGS: There is a mildly impacted right femoral neck fracture. The femoral head is appropriately located. Normal visualized bowel gas pattern. Vascular calcifications are noted.  IMPRESSION: Mildly impacted acute right femoral neck fracture.   Electronically Signed   By: Conchita Paris M.D.   On: 09/04/2014 21:59    EKG:   Orders placed or performed during the hospital encounter of 09/04/14  . ED EKG  . ED EKG    IMPRESSION AND PLAN:   81 year old Caucasian gentleman history of dementia presenting after mechanical fall.  1. Preoperative evaluation of right femoral neck fracture: Moderate risk for moderate risk surgery from cardiovascular standpoint, no signs or symptoms of active cardiovascular disease including congestive heart failure, significant arrhythmia, and valvular dysfunction no further testing or interventions required prior to surgery. METs <4, consult orthopedic surgery, hold antiplatelets/anticoagulates, initiate pain medications as well as bowel regiment, NPO 2. GERD without esophagitis: PPI therapy 3. Coronary artery disease, native heart, native vessel, without angina: Restart aspirin postoperative period 4. Venous thromboembolism prophylactic SCDs   All the records are reviewed and case discussed with ED provider. Management plans discussed with the patient, family and they are in agreement.  CODE STATUS: dnr   TOTAL TIME TAKING CARE OF THIS PATIENT: 30  minutes.    Hower,  Karenann Cai.D on 09/05/2014 at 12:07 AM  Between 7am to 6pm - Pager - [phone]  After 6pm: House Pager: - [facility]  Office  [phone]  CC: Primary care physician; Viviana Simpler,  MD

## 2014-09-05 NOTE — Telephone Encounter (Signed)
Admitted with hip fracture Will try to contact his daughter today

## 2014-09-05 NOTE — Clinical Social Work Note (Signed)
Clinical Social Work Assessment  Patient Details  Name: Dustin Byrd MRN: [id number] Date of Birth: [birth year]  Date of referral:  08/29/14               Reason for consult:  Facility Placement, Other (Comment Required) (From [facility] (long term care) )                Permission sought to share information with:  Chartered certified accountant granted to share information::  Yes, Verbal Permission Granted  Name::      [facility]::   SNF  Relationship::     Contact Information:     Housing/Transportation Living arrangements for the past 2 months:  [place] of Information:  Patient, Adult Children, Power of Attorney Patient Interpreter Needed:  None Criminal Activity/Legal Involvement Pertinent to Current Situation/Hospitalization:  No - Comment as needed Significant Relationships:  Adult Children Lives with:  Facility Resident Do you feel safe going back to the place where you live?  Yes Need for family participation in patient care:  Yes (Comment)  Care giving concerns: Patient is a long term care resident at [facility].    Social Worker assessment / plan: Per Cabin crew at [facility] patient is a long term care SNF resident and can return when stable. Patient is having surgery for a hip fracture with Dr. Roland Rack today. CSW met with patient to complete assessment. Patient was pleasantly confused. Patient was oriented to self only. CSW contacted patient's daughter Huel Coventry [phone]). Per daughter patient started out independent living at [facility] and has progressed to SNF. Per daughter patient has been at [facility] level of care since January 2016. Daughter reported that patient recently has been approved for Medicaid. Daughter is agreeable for patient to return to [facility] once medically stable.   FL2 complete and on chart.   Employment status:  Disabled (Comment on whether or not currently receiving  Disability), Retired Forensic scientist:  Medicare PT Recommendations:  Not assessed at this time Information / Referral to community resources:  [place]  Patient/Family's Response to care: Daughter is agreeable for patient to return to [facility] when medically stable.   Patient/Family's Understanding of and Emotional Response to Diagnosis, Current Treatment, and Prognosis: Daughter was pleasant thanked CSW for calling.   Emotional Assessment Appearance:  Appears stated age Attitude/Demeanor/Rapport:    Affect (typically observed):  Pleasant Orientation:  Fluctuating Orientation (Suspected and/or reported Sundowners) Alcohol / Substance use:  Not Applicable Psych involvement (Current and /or in the community):  No (Comment)  Discharge Needs  Concerns to be addressed:  Discharge Planning Concerns Readmission within the last 30 days:  No Current discharge risk:  Cognitively Impaired Barriers to Discharge:  Continued Medical Work up   Loralyn Freshwater, LCSW 09/05/2014, 11:57 AM

## 2014-09-05 NOTE — Op Note (Signed)
09/04/2014 - 09/05/2014  5:22 PM  Patient:   Dustin Byrd  Pre-Op Diagnosis:   Displaced femoral neck fracture, right hip.  Post-Op Diagnosis:   Same  Procedure:   Right hip unipolar hemiarthroplasty.  Surgeon:   Pascal Lux, MD  Assistant:   Cameron Proud, PA-C  Anesthesia:   Spinal  Findings:   As above.  Complications:   None  EBL:   250 cc  Fluids:   450 cc crystalloid  UOP:   100 cc  TT:   None  Drains:   None  Closure:   Staples  Implants:   Biomet press-fit system with a #11 standard offset Echo femoral stem, a -3 mm neck, and a 52 mm unipolar head.  Brief Clinical Note:   The patient is a pleasantly demented 81 year old male resident of an assisted living facility who apparently fell while transferring himself from a wheelchair to his bed last evening. X-rays in the emergency room demonstrated the above-noted displaced right femoral neck fracture. The patient has been cleared medically and presents at this time for definitive management of his injury.  Procedure:   The patient was brought into the operating room. After adequate spinal anesthesia was obtained, the patient was repositioned in the left lateral decubitus position and secured using a lateral hip positioner. The right hip and lower extremity were prepped with ChloroPrep solution before being draped sterilely. Preoperative antibiotics were administered. A timeout was performed to verify the appropriate surgical site before a standard posterior approach the hip was made through an approximately 4-5 inch incision. The incision was carried down through the subcutaneous tissues to expose the gluteal fascia and proximal end of the iliotibial band. These structures were split the length of the incision and the Charnley self-retaining hip retractor placed. The bursal tissues were swept posteriorly to expose the short external rotators. The anterior border of the piriformis tendon was identified and this plane  developed down through the capsule to enter the joint. Abundant fracture hematoma was suctioned. A flap of tissue was elevated off the posterior aspect of the femoral neck and greater trochanter and retracted posteriorly. This flap included the piriformis tendon, the short external rotators, and the posterior capsule. The femoral head was removed in its entirety, then taken to the back table where it was measured and found to be optimally replicated by a 52 mm head. The appropriate trial head was inserted and found to demonstrate an excellent suction fit.   Attention was directed to the femoral side. The femoral neck was recut 10-12 mm above the lesser trochanter using an oscillating saw. The piriformis fossa was debrided of soft tissues before the intramedullary canal was accessed through this point using a triple step reamer. The canal was reamed sequentially beginning with a #7 tapered reamer and progressing to a #11 tapered reamer. This provided excellent circumferential chatter. A box osteotome was used to establish version before the canal was broached sequentially beginning with a #7 broach and progressing to a #11 broach. This was left in place and several trial reductions performed. The permanent #11 standard offset femoral stem was impacted into place. A repeat trial reduction was performed using both the -6 mm and -3 mm neck lengths. The -3 mm neck length demonstrated excellent stability both in extension and external rotation as well as with flexion to 90 and internal rotation beyond 65. It also was stable in the position of sleep. The permanent 52 mm head with the -3 mm neck  adapter construct was put together on the back table before being impacted onto the stem of the femoral component. The Morse taper locking mechanism was verified using manual distraction before the head was relocated and the hip placed through a range of motion with the findings as described above.  The wound was copiously  irrigated with bacitracin saline solution via the jet lavage system before the peri-incisional and pericapsular tissues were injected with 30 cc of 0.5% Sensorcaine with epinephrine and 20 cc of Exparel diluted out to 60 cc with normal saline to help with postoperative analgesia. The posterior flap was reapproximated to the posterior aspect of the greater trochanter using #5 Tycron interrupted sutures placed through drill holes. The iliotibial band was reapproximated using #1 Vicryl interrupted sutures before the gluteal fascia was closed using a running #1 Vicryl suture. At this point, 1 g of transexemic acid in 10 cc of normal saline was injected into the joint to help reduce postoperative bleeding. The subcutaneous tissues were closed in several layers using 2-0 Vicryl interrupted sutures before the skin was closed using staples. A sterile occlusive dressing was applied to the wound before the patient was placed into an abduction wedge pillow. The patient was then rolled back into the supine position on his hospital bed before he was awakened and returned to the recovery room in satisfactory condition after tolerating the procedure well.

## 2014-09-05 NOTE — Telephone Encounter (Signed)
Pt seen ED on 09/04/14 and pt was admitted to [facility] rm [id number].

## 2014-09-05 NOTE — Care Management Note (Signed)
Case Management Note  Patient Details  Name: Dustin Byrd MRN: [id number] Date of Birth: [birth year]  Subjective/Objective:                  Met with this patient to discuss discharge planning. He is a very kind but demented man with femur fracture pending surgical repair. He is from [place] in long-term care. CSW will follow for discharge planning needs. O2 tubing was wadded in patients hand and he was asking me to "take it and don't drop it". He then wanted me to make sure I paid for his lawn to be mowed.   Action/Plan:  O2 placed back on patient's face with explanation of need. I reassured patient that his lawn would be taken care of- he said "okay". No current RNCM needs for discharge planning.      Expected Discharge Date:  09/08/14               Expected Discharge Plan:     In-House Referral:  Clinical Social Work  Discharge planning Services  CM Consult  Post Acute Care Choice:    Choice offered to:  Patient  DME Arranged:    DME Agency:     HH Arranged:    [place] Agency:     Status of Service:  Completed, signed off  Medicare Important Message Given:    Date Medicare IM Given:    Medicare IM give by:    Date Additional Medicare IM Given:    Additional Medicare Important Message give by:     If discussed at [place] of Stay Meetings, dates discussed:    Additional Comments:  Marshell Garfinkel, RN 09/05/2014, 10:43 AM

## 2014-09-05 NOTE — Procedures (Addendum)
Pre-procedure diagnosis: difficult foley catheterization Post-procedure diagnosis: as above  Procedure performed: placement of complicated foley  Provider:  Herbert Moors, FNP  Drains: 97F coude tipped foley  Indications: Patient unable to void on his own. Nursing staff have been unsuccessful at placing catheter.  Procedure: Gentials were prepped and draped in the routine sterile fashion. 10cc of 1% viscous lidocaine jelly was then injected into the patient's urethra. A 97F coude tipped catheter was then gently passed in to the urethral. Resistance was met at the prostate, but with gentle pressue the catheter slid through the prostate and into the bladder. Cloudy amber urine was returned. Patient tolerated the procedure well - no immediate issues.  Disposition: Recommend start flomax.  Remove Foley in 48hrs or when patient becomes ambulatory.  Please contact Urology with any further questions or concerns.

## 2014-09-05 NOTE — Progress Notes (Signed)
Initial Nutrition Assessment  INTERVENTION:   Coordination of Care: will continue to assess for malnutrition as no family present and unable to complete physical assessment secondary to pt status. Medical Food Supplement Therapy: will recommend Ensure once diet order able to be advanced.   NUTRITION DIAGNOSIS:   Inadequate oral intake related to inability to eat as evidenced by NPO status.  GOAL:   Patient will meet greater than or equal to 90% of their needs  MONITOR:    (Energy Intake, Digestive System, Anthropometrics, Electrolyte and renal Profile)  REASON FOR ASSESSMENT:   Consult, Malnutrition Screening Tool Hip fracture protocol  ASSESSMENT:   Pt admitted s/p fall with femoral neck fracture, plan for surgical intervention today per MD note.  Past Medical History  Diagnosis Date  . Gout   . CAD (coronary artery disease)   . DJD of shoulder   . UC (ulcerative colitis)   . Asbestos exposure   . Hypercholesterolemia   . Esophageal stricture   . Orthostatic hypotension   . Personal history of colonic polyps 1991    villous adenoma of cecum  . Memory loss   . 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 Chronic kidney disease, stage III (moderate)   . Encounter for long-term (current) use of other medications   . Diverticulosis of colon (without mention of hemorrhage)   . Anxiety   . Colon cancer 1994    Diet Order:  Diet NPO time specified Except for: Sips with Meds    Current Nutrition: Pt NPO currently   Food/Nutrition-Related History: Pt reports 'I eat breakfast. I eat lunch,' but pt unable to report any other information gathered on visit. Per MST pt with decreased appetite PTA. Unsure if pt drinking supplement PTA.   Medications: NS at 7mL/hr, Colace, Protonix  Electrolyte/Renal Profile and Glucose Profile:   Recent Labs Lab 09/04/14 2109 09/05/14 0614  NA 138 139  K 4.3 3.9  CL  101 103  CO2 30 30  BUN 32* 32*  CREATININE 1.20 1.14  CALCIUM 8.9 8.4*  GLUCOSE 87 106*   Protein Profile: No results for input(s): ALBUMIN in the last 168 hours.  Gastrointestinal Profile: Last BM: unknown   Nutrition-Focused Physical Exam Findings: Nutrition-Focused physical exam completed. Findings are incomplete evaluation of fat depletion, moderate muscle depletion, and no edema.    Weight Change: Per [facility] pt weight loss of 10% in 8 months.   Skin:  Reviewed, no issues   Height:   Ht Readings from Last 1 Encounters:  09/04/14 6' (1.829 m)    Weight:   Wt Readings from Last 1 Encounters:  09/05/14 137 lb 9.6 oz (62.415 kg)   Wt Readings from Last 10 Encounters:  09/05/14 137 lb 9.6 oz (62.415 kg)  12/21/13 153 lb (69.4 kg)  09/29/13 147 lb (66.679 kg)  06/30/13 147 lb (66.679 kg)  03/31/13 147 lb (66.679 kg)  12/23/12 145 lb (65.772 kg)  09/23/12 142 lb (64.411 kg)  09/02/12 143 lb (64.864 kg)  07/22/12 146 lb (66.225 kg)  04/29/12 146 lb (66.225 kg)    BMI:  Body mass index is 18.66 kg/(m^2).  Estimated Nutritional Needs:   Kcal:  1712-2023kcals, BEE: 1297kcals, TEE: (IF 1.1-1.3)(AF 1.2)  Protein:  62-74g protein (1.0-1.2g/kg)  Fluid:  1560-1882mL of fluid (25-58mL/kg)  EDUCATION NEEDS:   Education needs no appropriate at this time   [place], RD, LDN Pager (  [phone]  

## 2014-09-05 NOTE — Plan of Care (Signed)
Problem: Phase I Progression Outcomes Goal: Pre op pain controlled with appropriate interventions Outcome: Progressing No complaints of pain this shift. Goal: Pre op NPO per MD orders Outcome: Completed/Met Date Met:  09/05/14 NPO after midnight.

## 2014-09-05 NOTE — Anesthesia Procedure Notes (Signed)
Spinal Patient location during procedure: OR Start time: 09/05/2014 3:00 PM End time: 09/05/2014 3:12 PM Staffing Performed by: anesthesiologist  Preanesthetic Checklist Completed: patient identified, site marked, surgical consent, pre-op evaluation, timeout performed, IV checked, risks and benefits discussed and monitors and equipment checked Spinal Block Patient position: sitting Prep: Betadine Patient monitoring: heart rate, continuous pulse ox, blood pressure and cardiac monitor Approach: midline Location: L4-5 Injection technique: single-shot Needle Needle type: Whitacre and Introducer  Needle gauge: 24 G Needle length: 9 cm Needle insertion depth: 8 cm Assessment Sensory level: T8 Additional Notes Negative paresthesia. Negative blood return. Positive free-flowing CSF. Expiration date of kit checked and confirmed. Patient tolerated procedure well, without complications.

## 2014-09-05 NOTE — Progress Notes (Signed)
[facility] [place] NAME: Dustin Byrd    MR#:  [id number]  DATE OF BIRTH:  1918-10-26  SUBJECTIVE:  CHIEF COMPLAINT:   Chief Complaint  Patient presents with  . Fall   - Patient with dementia admitted after a fall and right femoral neck fracture - For surgery this afternoon, urinary retention noted - foley placed - fever this AM  REVIEW OF SYSTEMS:  Review of Systems  Unable to perform ROS: dementia    DRUG ALLERGIES:   Allergies  Allergen Reactions  . Aricept [Donepezil Hydrochloride]     agitation  . Cortisone   . Prednisone     Mania     VITALS:  Blood pressure 130/60, pulse 89, temperature 98.6 F (37 C), temperature source Oral, resp. rate 20, height 6' (1.829 m), weight 62.415 kg (137 lb 9.6 oz), SpO2 95 %.  PHYSICAL EXAMINATION:  Physical Exam  GENERAL:  81 y.o.-year-old elderly patient lying in the bed with no acute distress.  EYES: Pupils equal, round, reactive to light and accommodation. No scleral icterus. Extraocular muscles intact.  HEENT: Head atraumatic, normocephalic. Oropharynx and nasopharynx clear.  Lip smacking/tremors, dystonia noted NECK:  Supple, no jugular venous distention. No thyroid enlargement, no tenderness.  LUNGS: Normal breath sounds bilaterally, no wheezing, rales,rhonchi or crepitation. No use of accessory muscles of respiration. Decreased bibasilar breath sounds CARDIOVASCULAR: S1, S2 normal. No rubs, or gallops. 3/6 systolic murmur present. ABDOMEN: Soft, nontender, nondistended. Bowel sounds present. No organomegaly or mass.  EXTREMITIES: No pedal edema, cyanosis, or clubbing.  NEUROLOGIC: Cranial nerves II through XII are intact. Unable to follow commands, hand grips are strong, guarding RLE due to pain.  Sensation intact. Gait not checked.  PSYCHIATRIC: The patient is alert and not oriented.  SKIN: No obvious rash, lesion, or ulcer.    LABORATORY PANEL:   CBC  Recent  Labs Lab 09/05/14 0614  WBC 11.3*  HGB 11.8*  HCT 36.3*  PLT 111*   ------------------------------------------------------------------------------------------------------------------  Chemistries   Recent Labs Lab 09/05/14 0614  NA 139  K 3.9  CL 103  CO2 30  GLUCOSE 106*  BUN 32*  CREATININE 1.14  CALCIUM 8.4*   ------------------------------------------------------------------------------------------------------------------  Cardiac Enzymes No results for input(s): TROPONINI in the last 168 hours. ------------------------------------------------------------------------------------------------------------------  RADIOLOGY:  Dg Chest Portable 1 View  09/04/2014   CLINICAL DATA:  Patient fell at home, right hip pain, preop  EXAM: PORTABLE CHEST - 1 VIEW  COMPARISON:  04/25/2014  FINDINGS: Stable mild cardiac enlargement.  Patient is status post CABG.  Stable calcified 1 cm nodule left upper lobe.  Progressive architectural distortion right lung apex. Mild bibasilar opacities suggesting atelectasis.  IMPRESSION: Bibasilar atelectasis. Progressive architectural distortion right apex which could be postsurgical if there has been thoracotomy. As suggested on report of prior chest radiograph performed 04/25/2014 if there is no history of surgery recently been CT thorax would be suggested. Otherwise would suggest follow-up chest radiographs in about 3 months.   Electronically Signed   By: Skipper Cliche M.D.   On: 09/04/2014 21:58   Dg Hip Unilat With Pelvis 2-3 Views Right  09/04/2014   CLINICAL DATA:  Right hip pain, fall  EXAM: DG HIP (WITH OR WITHOUT PELVIS) 2-3V RIGHT  COMPARISON:  None.  FINDINGS: There is a mildly impacted right femoral neck fracture. The femoral head is appropriately located. Normal visualized bowel gas pattern. Vascular calcifications are noted.  IMPRESSION: Mildly impacted acute right femoral  neck fracture.   Electronically Signed   By: Conchita Paris M.D.    On: 09/04/2014 21:59    EKG:   Orders placed or performed during the hospital encounter of 09/04/14  . ED EKG  . ED EKG  . EKG 12-Lead  . EKG 12-Lead    ASSESSMENT AND PLAN:   81y/oM with PMH of CAD s/p CABG, CKD stage3, Dementia, Anxiety from [facility], admitted after a fall and right femoral head fracture.  #1 right femoral fracture-moderate risk for surgery from cardiovascular standpoint. -No recent cardiac symptoms including angina or congestive heart failure or significant arrhythmias. -Can proceed with surgery as benefits outweigh risks. -Orthopedic consult, pain management. -Postoperative DVT prophylaxis and physical therapy.  #2 coronary artery disease-status post CABG-no angina symptoms. -Continue cardiac medications  #3 acute urinary retention-appreciate urology input in placing the Foley catheter. -Flomax started per their recommendations. -Voiding trial in 5 days.  #4 fever-unfortunately blood cultures were not sent in this morning. -No further fevers now. -Check a urine analysis. Chest x-ray with no infection.  #5 history of ulcerative colitis-stable on mesalamine  #6 GERD-continue Protonix.  #7 history of dementia-continue home medications. Currently, and cooperative. Watch for any postop delirium.  #8 DVT Prophylaxis- for surgery today   All the records are reviewed and case discussed with Care Management/Social Workerr. Management plans discussed with the patient, family and they are in agreement.  CODE STATUS: DNR  TOTAL TIME TAKING CARE OF THIS PATIENT: 38 minutes.   POSSIBLE D/C IN 3 DAYS, DEPENDING ON CLINICAL CONDITION.   Breton Berns M.D on 09/05/2014 at 2:32 PM  Between 7am to 6pm - Pager - [phone]  After 6pm go to [email or web address] - password [email or web address] [place] [facility]  Office  [phone]  CC: Primary care physician; Viviana Simpler, MD

## 2014-09-05 NOTE — Anesthesia Preprocedure Evaluation (Addendum)
Anesthesia Evaluation  Patient identified by MRN, date of birth, ID band Patient awake    Reviewed: Allergy & Precautions, NPO status , Patient's Chart, lab work & pertinent test results  History of Anesthesia Complications (+) history of anesthetic complications  Airway Mallampati: III  TM Distance: >3 FB Neck ROM: Full    Dental  (+) Edentulous Upper, Edentulous Lower   Pulmonary former smoker,          Cardiovascular hypertension, Pt. on medications + CAD     Neuro/Psych Anxiety    GI/Hepatic   Endo/Other    Renal/GU Renal InsufficiencyRenal disease     Musculoskeletal   Abdominal   Peds  Hematology   Anesthesia Other Findings   Reproductive/Obstetrics                            Anesthesia Physical Anesthesia Plan  ASA: III  Anesthesia Plan: Spinal   Post-op Pain Management:    Induction: Intravenous  Airway Management Planned: Nasal Cannula  Additional Equipment:   Intra-op Plan:   Post-operative Plan:   Informed Consent: I have reviewed the patients History and Physical, chart, labs and discussed the procedure including the risks, benefits and alternatives for the proposed anesthesia with the patient or authorized representative who has indicated his/her understanding and acceptance.     Plan Discussed with:   Anesthesia Plan Comments:         Anesthesia Quick Evaluation

## 2014-09-05 NOTE — Telephone Encounter (Signed)
PLEASE NOTE: All timestamps contained within this report are represented as [place] Standard Time. CONFIDENTIALTY NOTICE: This fax transmission is intended only for the addressee. It contains information that is legally privileged, confidential or otherwise protected from use or disclosure. If you are not the intended recipient, you are strictly prohibited from reviewing, disclosing, copying using or disseminating any of this information or taking any action in reliance on or regarding this information. If you have received this fax in error, please notify us immediately by telephone so that we can arrange for its return to [place]. Phone: [phone], Toll-Free: [phone], Fax: [phone] Page: 1 of 1 Call Id: [id number] [place] Patient Name: Dustin Byrd Gender: Male DOB: [birth year] Age: 23 Y 2 M 1 D Return Phone Number: Address: City/State/Zip: [address] Client [place] Night - Client Client Site [place] Physician Viviana Simpler Contact Type Call Call Type Page Only Caller Name Trena Platt [place], [place] Relationship To Patient Provider Is this call to report lab results? No Return Phone Number Please choose phone number Initial Comment Caller states pt fell in the hall, hurt his hip, screams when they try and move it Nurse Assessment Guidelines Guideline Title Affirmed Question Affirmed Notes Nurse Date/Time ([place] Time) Disp. Time Eilene Ghazi Time) Disposition Final User 09/04/2014 7:00:02 PM Send to [place], Lori 09/04/2014 7:12:58 PM Called On-Call Provider Alphonsa Overall 09/04/2014 7:13:34 PM Page Completed Yes Alphonsa Overall After Care Instructions Given Call Event Type User Date / Time Description Paging DoctorName Phone DateTime Result/Outcome Message Type Notes Deborra Medina  [phone] 09/04/2014 7:12:58 PM Called On Call Provider - Reached Doctor Paged Deborra Medina 09/04/2014 7:13:00 PM Spoke with On Call - General Message Result

## 2014-09-06 LAB — URINALYSIS COMPLETE WITH MICROSCOPIC (ARMC ONLY)
BACTERIA UA: NONE SEEN
Bacteria, UA: NONE SEEN
Bilirubin Urine: NEGATIVE
Bilirubin Urine: NEGATIVE
GLUCOSE, UA: NEGATIVE mg/dL
Glucose, UA: NEGATIVE mg/dL
KETONES UR: NEGATIVE mg/dL
Ketones, ur: NEGATIVE mg/dL
NITRITE: NEGATIVE
Nitrite: NEGATIVE
PROTEIN: 100 mg/dL — AB
PROTEIN: 100 mg/dL — AB
SPECIFIC GRAVITY, URINE: 1.025 (ref 1.005–1.030)
Specific Gravity, Urine: 1.027 (ref 1.005–1.030)
Squamous Epithelial / LPF: NONE SEEN
pH: 5 (ref 5.0–8.0)
pH: 5 (ref 5.0–8.0)

## 2014-09-06 LAB — CBC WITH DIFFERENTIAL/PLATELET
Basophils Absolute: 0 10*3/uL (ref 0–0.1)
Basophils Relative: 0 %
EOS ABS: 0 10*3/uL (ref 0–0.7)
HCT: 36.8 % — ABNORMAL LOW (ref 40.0–52.0)
Hemoglobin: 11.8 g/dL — ABNORMAL LOW (ref 13.0–18.0)
LYMPHS ABS: 0.7 10*3/uL — AB (ref 1.0–3.6)
Lymphocytes Relative: 6 %
MCH: 29.5 pg (ref 26.0–34.0)
MCHC: 32 g/dL (ref 32.0–36.0)
MCV: 92.3 fL (ref 80.0–100.0)
MONO ABS: 0.7 10*3/uL (ref 0.2–1.0)
Neutro Abs: 9.5 10*3/uL — ABNORMAL HIGH (ref 1.4–6.5)
Neutrophils Relative %: 87 %
PLATELETS: 95 10*3/uL — AB (ref 150–440)
RBC: 3.98 MIL/uL — ABNORMAL LOW (ref 4.40–5.90)
RDW: 15.7 % — AB (ref 11.5–14.5)
WBC: 11 10*3/uL — ABNORMAL HIGH (ref 3.8–10.6)

## 2014-09-06 LAB — BASIC METABOLIC PANEL
Anion gap: 6 (ref 5–15)
BUN: 33 mg/dL — AB (ref 6–20)
CO2: 29 mmol/L (ref 22–32)
CREATININE: 1.29 mg/dL — AB (ref 0.61–1.24)
Calcium: 8.4 mg/dL — ABNORMAL LOW (ref 8.9–10.3)
Chloride: 105 mmol/L (ref 101–111)
GFR calc Af Amer: 53 mL/min — ABNORMAL LOW (ref >60)
GFR, EST NON AFRICAN AMERICAN: 46 mL/min — AB (ref >60)
Glucose, Bld: 117 mg/dL — ABNORMAL HIGH (ref 65–99)
POTASSIUM: 4 mmol/L (ref 3.5–5.1)
SODIUM: 140 mmol/L (ref 135–145)

## 2014-09-06 MED ORDER — VANCOMYCIN HCL IN DEXTROSE 750-5 MG/150ML-% IV SOLN
750.0000 mg | INTRAVENOUS | Status: DC
  Administered 2014-09-07 – 2014-09-08 (×2): 750 mg via INTRAVENOUS
  Filled 2014-09-06 (×3): qty 150

## 2014-09-06 MED ORDER — PIPERACILLIN-TAZOBACTAM 3.375 G IVPB
3.3750 g | Freq: Three times a day (TID) | INTRAVENOUS | Status: DC
  Administered 2014-09-06 – 2014-09-08 (×6): 3.375 g via INTRAVENOUS
  Filled 2014-09-06 (×11): qty 50

## 2014-09-06 MED ORDER — VANCOMYCIN HCL IN DEXTROSE 1-5 GM/200ML-% IV SOLN
1000.0000 mg | Freq: Once | INTRAVENOUS | Status: AC
  Administered 2014-09-06: 1000 mg via INTRAVENOUS
  Filled 2014-09-06 (×2): qty 200

## 2014-09-06 MED ORDER — ENSURE ENLIVE PO LIQD
237.0000 mL | Freq: Three times a day (TID) | ORAL | Status: DC
  Administered 2014-09-06 – 2014-09-08 (×6): 237 mL via ORAL

## 2014-09-06 NOTE — Progress Notes (Signed)
ANTIBIOTIC CONSULT NOTE - INITIAL  Pharmacy Consult for Vancomycin and Zosyn Indication: rule out sepsis  Allergies  Allergen Reactions  . Aricept [Donepezil Hydrochloride]     agitation  . Cortisone   . Prednisone     Mania     Patient Measurements: Height: 6' (182.9 cm) Weight: 137 lb 9.6 oz (62.415 kg) IBW/kg (Calculated) : 77.6 Adjusted Body Weight: n/a  Vital Signs: Temp: 99.5 F (37.5 C) (08/31 0821) Temp Source: Oral (08/31 0821) BP: 166/66 mmHg (08/31 0821) Pulse Rate: 93 (08/31 0821) Intake/Output from previous day: 08/30 0701 - 08/31 0700 In: 1248.8 [I.V.:1148.8; IV Piggyback:100] Out: 1025 [Urine:775; Blood:250] Intake/Output from this shift:    Labs:  Recent Labs  09/04/14 2109 09/05/14 0614 09/06/14 0705  WBC 10.6 11.3* 11.0*  HGB 12.7* 11.8* 11.8*  PLT 134* 111* 95*  CREATININE 1.20 1.14 1.29*   Estimated Creatinine Clearance: 30.9 mL/min (by C-G formula based on Cr of 1.29). No results for input(s): VANCOTROUGH, VANCOPEAK, VANCORANDOM, GENTTROUGH, GENTPEAK, GENTRANDOM, TOBRATROUGH, TOBRAPEAK, TOBRARND, AMIKACINPEAK, AMIKACINTROU, AMIKACIN in the last 72 hours.   Microbiology: Recent Results (from the past 720 hour(s))  Surgical pcr screen     Status: None   Collection Time: 09/05/14  1:30 AM  Result Value Ref Range Status   MRSA, PCR NEGATIVE NEGATIVE Final   Staphylococcus aureus NEGATIVE NEGATIVE Final    Comment:        The Xpert SA Assay (FDA approved for NASAL specimens in patients over 76 years of age), is one component of a comprehensive surveillance program.  Test performance has been validated by [facility] for patients greater than or equal to 34 year old. It is not intended to diagnose infection nor to guide or monitor treatment.     Medical History: Past Medical History  Diagnosis Date  . Gout   . CAD (coronary artery disease)   . DJD of shoulder   . UC (ulcerative colitis)   . Asbestos exposure   .  Hypercholesterolemia   . Esophageal stricture   . Orthostatic hypotension   . Personal history of colonic polyps 1991    villous adenoma of cecum  . Memory loss   . 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 Chronic kidney disease, stage III (moderate)   . Encounter for long-term (current) use of other medications   . Diverticulosis of colon (without mention of hemorrhage)   . Anxiety   . Colon cancer 1994    Medications:  Anti-infectives    Start     Dose/Rate Route Frequency Ordered Stop   09/07/14 0300  vancomycin (VANCOCIN) IVPB 750 mg/150 ml premix     750 mg 150 mL/hr over 60 Minutes Intravenous Every 24 hours 09/06/14 1019     09/06/14 1400  piperacillin-tazobactam (ZOSYN) IVPB 3.375 g     3.375 g 12.5 mL/hr over 240 Minutes Intravenous 3 times per day 09/06/14 1019     09/06/14 1100  vancomycin (VANCOCIN) IVPB 1000 mg/200 mL premix     1,000 mg 200 mL/hr over 60 Minutes Intravenous  Once 09/06/14 1019     09/05/14 1900  ceFAZolin (ANCEF) IVPB 2 g/50 mL premix     2 g 100 mL/hr over 30 Minutes Intravenous Every 6 hours 09/05/14 1847 09/06/14 0709   09/05/14 0754  ceFAZolin (ANCEF) IVPB 2 g/50 mL premix     2 g 100 mL/hr over 30 Minutes Intravenous 30 min pre-op  09/05/14 0754 09/05/14 1517     Assessment: Patient admitted for femur fracture and is s/p repair. Patient now spiking fevers so empirically started on Vancomycin and Zosyn.  Vancomycin pharmacokinetic: Ke=0.03, t1/2=23hr, Vd=43.7 L  Goal of Therapy:  Vancomycin trough level 15-20 mcg/ml  Plan:  Measure antibiotic drug levels at steady state Follow up culture results Will order Zosyn 3.375g IV q8h EI. Will order Vancomycin 1g IV once followed in 16 hours by Vancomycin 750mg  IV q24h for stacked dosing. Will check a trough level prior to 5th dose.  Paulina Fusi, PharmD, BCPS 09/06/2014 10:27 AM

## 2014-09-06 NOTE — Progress Notes (Signed)
Pt with sitter

## 2014-09-06 NOTE — Anesthesia Postprocedure Evaluation (Signed)
  Anesthesia Post-op Note  Patient: Dustin Byrd  Procedure(s) Performed: Procedure(s): ARTHROPLASTY BIPOLAR HIP (HEMIARTHROPLASTY) (Right)  Anesthesia type:Spinal  Patient location: 151  Post pain: Pain level controlled  Post assessment: Post-op Vital signs reviewed, Patient's Cardiovascular Status Stable, Respiratory Function Stable, Patent Airway and No signs of Nausea or vomiting  Post vital signs: Reviewed and stable  Last Vitals:  Filed Vitals:   09/06/14 0348  BP: 146/58  Pulse: 94  Temp: 38.3 C  Resp: 18    Level of consciousness: awake, alert  and patient cooperative  Complications: No apparent anesthesia complications

## 2014-09-06 NOTE — Evaluation (Signed)
Physical Therapy Evaluation Patient Details Name: Dustin Byrd MRN: [id number] DOB: Mar 03, 1918 Today's Date: 09/06/2014   History of Present Illness  Pt is a 81 y.o. male s/p fall at [facility] facility transferring self sustaining mildly impacted acute R femoral neck fx.  Pt s/p R hip unipolar hemiarthroplasty 09/05/14.  PMH:  Dementia, CAD s/p CABG.  Clinical Impression  Currently pt demonstrates impairments with balance, strength, pain, and limitations with functional mobility.  Prior to admission, pt would transfer himself independently from w/c and walk by himself (sometimes with RW and sometimes without AD) but staff and daughter preferred pt be in w/c and call for help with mobility for safety.  Pt lives at [facility].  Currently pt requires 2 assist for bed mobility and transfers and requires vc's for posterior hip precautions.  Pt would benefit from skilled PT to address above noted impairments and functional limitations.  Recommend pt discharge to [facility] when medically appropriate.     Follow Up Recommendations SNF    Equipment Recommendations       Recommendations for Other Services OT consult     Precautions / Restrictions Precautions Precautions: Fall;Posterior Hip (75 degrees flexion limit) Restrictions Weight Bearing Restrictions: Yes RLE Weight Bearing: Weight bearing as tolerated LLE Weight Bearing: Weight bearing as tolerated      Mobility  Bed Mobility Overal bed mobility: Needs Assistance;+2 for physical assistance Bed Mobility: Supine to Sit     Supine to sit: Max assist;+2 for physical assistance        Transfers Overall transfer level: Needs assistance Equipment used: None Transfers: Stand Pivot Transfers   Stand pivot transfers: Mod assist;Max assist;+2 physical assistance       General transfer comment: stand pivot to L bed to recliner; pt kept R LE NWB'ing and pivoted on L LE only  Ambulation/Gait             General Gait Details:  Not appropriate at this time  Stairs            Wheelchair Mobility    Modified Rankin (Stroke Patients Only)       Balance Overall balance assessment: Needs assistance;History of Falls Sitting-balance support: Bilateral upper extremity supported;Feet supported Sitting balance-Leahy Scale: Poor Sitting balance - Comments: posterior lean   Standing balance support: During functional activity Standing balance-Leahy Scale: Poor                               Pertinent Vitals/Pain Pain Assessment: Faces Faces Pain Scale: Hurts little more Pain Location: R hip Pain Descriptors / Indicators: Guarding Pain Intervention(s): Limited activity within patient's tolerance;Monitored during session;Repositioned    Home Living Family/patient expects to be discharged to:: Skilled nursing facility                 Additional Comments: Pt was at [facility] facility (LTC)    Prior Function Level of Independence: Needs assistance   Gait / Transfers Assistance Needed: pt would transfer himself independently from w/c and walk by himself (sometimes with RW and sometimes without AD) but staff and daughter preferred pt be in w/c and call for help with mobility for safety           Hand Dominance        Extremity/Trunk Assessment   Upper Extremity Assessment: Generalized weakness           Lower Extremity Assessment: RLE deficits/detail;LLE deficits/detail RLE Deficits /  Details: R hip flexion at least 2+/5; R knee flexion/extension at least 3/5; R DF at least 3+/5 LLE Deficits / Details: [facility]     Communication   Communication: No difficulties  Cognition Arousal/Alertness: Lethargic;Suspect due to medications Behavior During Therapy: Impulsive;Restless Overall Cognitive Status: Impaired/Different from baseline Area of Impairment: Orientation Orientation Level:  (pt knew his name and year of birth; also knew he had surgery yesterday)   Memory: Decreased  recall of precautions;Decreased short-term memory              General Comments General comments (skin integrity, edema, etc.): Dressings intact    Exercises  Treatment:  Attempted LE ex's with pt but pt had difficulty following commands consistently (pt would perform ankle pumps in supine or perform LAQ's in sitting).      Assessment/Plan    PT Assessment Patient needs continued PT services  PT Diagnosis Difficulty walking;Acute pain   PT Problem List Decreased strength;Decreased activity tolerance;Decreased balance;Decreased mobility;Decreased knowledge of use of DME;Decreased safety awareness;Decreased knowledge of precautions;Pain  PT Treatment Interventions DME instruction;Gait training;Functional mobility training;Therapeutic activities;Therapeutic exercise;Balance training;Patient/family education   PT Goals (Current goals can be found in the Care Plan section) Acute Rehab PT Goals Patient Stated Goal: To stand up and walk again PT Goal Formulation: With patient/family Time For Goal Achievement: 09/20/14 Potential to Achieve Goals: Fair    Frequency BID   Barriers to discharge        Co-evaluation               End of Session Equipment Utilized During Treatment: Gait belt;Oxygen Activity Tolerance: Patient limited by fatigue Patient left: in chair;with call bell/phone within reach;with chair alarm set;with family/visitor present;with SCD's reapplied Nurse Communication: Mobility status;Precautions;Weight bearing status         Time: 6578-4696 PT Time Calculation (min) (ACUTE ONLY): 50 min   Charges:   PT Evaluation $Initial PT Evaluation Tier I: 1 Procedure PT Treatments $Therapeutic Exercise: 8-22 mins   PT G CodesLeitha Bleak Sep 14, 2014, 4:46 PM Leitha Bleak, [place]

## 2014-09-06 NOTE — Progress Notes (Signed)
Physical Therapy Treatment Patient Details Name: Dustin Byrd MRN: [id number] DOB: 1918/09/26 Today's Date: 09/06/2014    History of Present Illness Pt is a 81 y.o. male s/p fall at [facility] facility transferring self sustaining mildly impacted acute R femoral neck fx.  Pt s/p R hip unipolar hemiarthroplasty 09/05/14.  PMH:  Dementia, CAD s/p CABG.    PT Comments    Pt able to progress to taking a few steps with RW this afternoon with 2 assist but pt with increased forward flexed posture and decreased WB'ing through R LE noted with continued steps.  Pt able to participate in PT and did better with following commands this afternoon although some impulsiveness still noted and pt required vc's and assist to maintain posterior hip precautions.  Follow Up Recommendations  SNF     Equipment Recommendations       Recommendations for Other Services OT consult     Precautions / Restrictions Precautions Precautions: Fall;Posterior Hip (75 degrees hip flexion limitation) Restrictions Weight Bearing Restrictions: Yes RLE Weight Bearing: Weight bearing as tolerated LLE Weight Bearing: Weight bearing as tolerated    Mobility  Bed Mobility Overal bed mobility: Needs Assistance;+2 for physical assistance Bed Mobility: Sit to Supine     Sit to supine: Max assist;+2 for physical assistance   General bed mobility comments: assist for trunk and B LE's  Transfers Overall transfer level: Needs assistance Equipment used: Rolling walker (2 wheeled) Transfers: Sit to/from Stand Sit to Stand: Min assist;Mod assist;+2 physical assistance       General transfer comment: pt required assist to stand fully upright  Ambulation/Gait Ambulation/Gait assistance: Mod assist;+2 physical assistance Ambulation Distance (Feet): 3 Feet Assistive device: Rolling walker (2 wheeled)       General Gait Details: decreased stance time R LE; increased forward flexed posture with distance requiring assist  to maintain upright; difficulty moving L LE   Stairs            Wheelchair Mobility    Modified Rankin (Stroke Patients Only)       Balance Overall balance assessment: Needs assistance;History of Falls Sitting-balance support: Bilateral upper extremity supported;Feet supported Sitting balance-Leahy Scale: Poor Sitting balance - Comments: posterior lean   Standing balance support: During functional activity Standing balance-Leahy Scale: Poor                      Cognition Arousal/Alertness: Lethargic;Suspect due to medications Behavior During Therapy: Impulsive;Restless Overall Cognitive Status: Impaired/Different from baseline Area of Impairment: Orientation Orientation Level:  (pt knew his name and year of birth; also knew he had surgery yesterday)   Memory: Decreased recall of precautions;Decreased short-term memory              Exercises      General Comments General comments (skin integrity, edema, etc.): Dressings intact  Nursing cleared pt for participation in physical therapy.  Pt agreeable to PT session.      Pertinent Vitals/Pain Pain Assessment: Faces Faces Pain Scale: Hurts little more Pain Location: R hip Pain Descriptors / Indicators: Guarding Pain Intervention(s): Limited activity within patient's tolerance;Monitored during session;Repositioned  Vitals stable and WFL throughout treatment session on 2 L/min via nasal cannula.     Home Living Family/patient expects to be discharged to:: Skilled nursing facility               Additional Comments: Pt was at [facility] facility (LTC)    Prior Function Level of Independence: Needs assistance  Gait / Transfers Assistance Needed: pt would transfer himself independently from w/c and walk by himself (sometimes with RW and sometimes without AD) but staff and daughter preferred pt be in w/c and call for help with mobility for safety       PT Goals (current goals can now be found in  the care plan section) Acute Rehab PT Goals Patient Stated Goal: To stand up and walk again PT Goal Formulation: With patient/family Time For Goal Achievement: 09/20/14 Potential to Achieve Goals: Fair Progress towards PT goals: Progressing toward goals    Frequency  BID    PT Plan Current plan remains appropriate    Co-evaluation             End of Session Equipment Utilized During Treatment: Gait belt;Oxygen Activity Tolerance: Patient limited by fatigue;Patient limited by pain Patient left: in bed;with call bell/phone within reach;with bed alarm set;with family/visitor present;with SCD's reapplied     Time: 1458-1530 PT Time Calculation (min) (ACUTE ONLY): 32 min  Charges: $Therapeutic Activity: 23-37 mins                    G CodesLeitha Bleak Sep 11, 2014, 5:00 PM Leitha Bleak, [place]

## 2014-09-06 NOTE — Clinical Documentation Improvement (Signed)
Internal Medicine Orthopedic  #1of 2:  Abnormal Lab/Test Results: Platelets 134K/uL 09/04/14 and 111K/uL 09/05/14.     Possible Clinical Conditions associated with below indicators  Thrombocytopenia  Other Condition  Cannot Clinically Determine  Supporting Information: History of thrombocytopenia noted in past medical history grid.  Please document in your future progress notes and discharge summary if present this admission.    #2 of 2: CXR 09/04/14 (pre-op) notes atelectasis.  Please document in your progress notes and discharge summary if you agree with the xray findings of atelectasis.    Please exercise your independent, professional judgment when responding. A specific answer is not anticipated or expected.   Thank you, Mateo Flow, RN [phone] Clinical Documentation Specialist

## 2014-09-06 NOTE — Care Management Important Message (Signed)
Important Message  Patient Details  Name: Dustin Byrd MRN: [id number] Date of Birth: Aug 15, 1918   Medicare Important Message Given:  Yes-second notification given    Juliann Pulse A Allmond 09/06/2014, 1:22 PM

## 2014-09-06 NOTE — Progress Notes (Signed)
[place] [facility] [place] NAME: Dustin Byrd    MR#:  [id number]  DATE OF BIRTH:  Nov 26, 1918  SUBJECTIVE:  CHIEF COMPLAINT:   Chief Complaint  Patient presents with  . Fall   - s/p right hip hemiarthroplasty for femoral head fracture, POD #1 today, Patient has dementia and confused - Febrile since yesterday- prior to surgery noted - cultures and ABX ordered today  REVIEW OF SYSTEMS:  Review of Systems  Unable to perform ROS: dementia    DRUG ALLERGIES:   Allergies  Allergen Reactions  . Aricept [Donepezil Hydrochloride]     agitation  . Cortisone   . Prednisone     Mania     VITALS:  Blood pressure 166/66, pulse 93, temperature 99.5 F (37.5 C), temperature source Oral, resp. rate 18, height 6' (1.829 m), weight 62.415 kg (137 lb 9.6 oz), SpO2 97 %.  PHYSICAL EXAMINATION:  Physical Exam  GENERAL:  81 y.o.-year-old elderly patient lying in the bed with no acute distress.  EYES: Pupils equal, round, reactive to light and accommodation. No scleral icterus. Extraocular muscles intact.  HEENT: Head atraumatic, normocephalic. Oropharynx and nasopharynx clear.  Lip smacking/tremors, dystonia noted NECK:  Supple, no jugular venous distention. No thyroid enlargement, no tenderness.  LUNGS: Normal breath sounds bilaterally, no wheezing, rales,rhonchi or crepitation. No use of accessory muscles of respiration. Decreased bibasilar breath sounds CARDIOVASCULAR: S1, S2 normal. No rubs, or gallops. 3/6 systolic murmur present. ABDOMEN: Soft, nontender, nondistended. Bowel sounds present. No organomegaly or mass.  EXTREMITIES: No pedal edema, cyanosis, or clubbing.  NEUROLOGIC: Cranial nerves II through XII are intact. Unable to follow commands, hand grips are strong, guarding RLE due to pain.  Sensation intact. Gait not checked.  PSYCHIATRIC: The patient is alert and not oriented.  SKIN: No obvious rash, lesion, or ulcer.     LABORATORY PANEL:   CBC  Recent Labs Lab 09/06/14 0705  WBC 11.0*  HGB 11.8*  HCT 36.8*  PLT 95*   ------------------------------------------------------------------------------------------------------------------  Chemistries   Recent Labs Lab 09/06/14 0705  NA 140  K 4.0  CL 105  CO2 29  GLUCOSE 117*  BUN 33*  CREATININE 1.29*  CALCIUM 8.4*   ------------------------------------------------------------------------------------------------------------------  Cardiac Enzymes No results for input(s): TROPONINI in the last 168 hours. ------------------------------------------------------------------------------------------------------------------  RADIOLOGY:  Dg Chest Portable 1 View  09/04/2014   CLINICAL DATA:  Patient fell at home, right hip pain, preop  EXAM: PORTABLE CHEST - 1 VIEW  COMPARISON:  04/25/2014  FINDINGS: Stable mild cardiac enlargement.  Patient is status post CABG.  Stable calcified 1 cm nodule left upper lobe.  Progressive architectural distortion right lung apex. Mild bibasilar opacities suggesting atelectasis.  IMPRESSION: Bibasilar atelectasis. Progressive architectural distortion right apex which could be postsurgical if there has been thoracotomy. As suggested on report of prior chest radiograph performed 04/25/2014 if there is no history of surgery recently been CT thorax would be suggested. Otherwise would suggest follow-up chest radiographs in about 3 months.   Electronically Signed   By: Skipper Cliche M.D.   On: 09/04/2014 21:58   Dg Hip Port Unilat With Pelvis 1v Right  09/05/2014   CLINICAL DATA:  81 year old male status post right hip arthroplasty for proximal femur fracture. Initial encounter.  EXAM: DG HIP (WITH OR WITHOUT PELVIS) 1V PORT RIGHT  COMPARISON:  Preoperative study 09/04/2014  FINDINGS: Portable AP and cross-table lateral views of the right hip. Bipolar type arthroplasty in place. Hardware  components appear intact and normally  aligned. Overlying skin staples and other postoperative changes to the soft tissues. No unexpected osseous changes identified. Visible pelvis appears stable.  IMPRESSION: Right hip arthroplasty with no adverse features.   Electronically Signed   By: Genevie Ann M.D.   On: 09/05/2014 17:48   Dg Hip Unilat With Pelvis 2-3 Views Right  09/04/2014   CLINICAL DATA:  Right hip pain, fall  EXAM: DG HIP (WITH OR WITHOUT PELVIS) 2-3V RIGHT  COMPARISON:  None.  FINDINGS: There is a mildly impacted right femoral neck fracture. The femoral head is appropriately located. Normal visualized bowel gas pattern. Vascular calcifications are noted.  IMPRESSION: Mildly impacted acute right femoral neck fracture.   Electronically Signed   By: Conchita Paris M.D.   On: 09/04/2014 21:59    EKG:   Orders placed or performed during the hospital encounter of 09/04/14  . ED EKG  . ED EKG  . EKG 12-Lead  . EKG 12-Lead    ASSESSMENT AND PLAN:   89y/oM with PMH of CAD s/p CABG, CKD stage3, Dementia, Anxiety from [facility], admitted after a fall and right femoral head fracture.  #1 right femoral fracture- s/p right hip hemiarthroplasty, POD #1 today -Orthopedic consult, pain management. -Postoperative DVT prophylaxis and physical therapy.  #2 coronary artery disease-status post CABG-no angina symptoms. -Continue cardiac medications  #3 acute urinary retention-appreciate urology input in placing the Foley catheter. -Flomax started per their recommendations. -Voiding trial in 5 days.  #4 Fevers- blood cultures and urine cultures ordered, f/u CXR - UA not very impressive for infection - started on empiric ABX- vancomycin and zosyn - fevers started prior to the surgery -Patient did get Ancef prophylaxis during and after surgery  #5 history of ulcerative colitis-stable on mesalamine  #6 GERD-continue Protonix.  #7 history of dementia-continue home medications. Currently, calm and cooperative.  Watch for any postop delirium.  #8 DVT Prophylaxis- On lovenox  All the records are reviewed and case discussed with Care Management/Social Workerr. Management plans discussed with the patient, family and they are in agreement.  CODE STATUS: DNR  TOTAL TIME TAKING CARE OF THIS PATIENT: 38 minutes.   POSSIBLE D/C IN 3 DAYS, DEPENDING ON CLINICAL CONDITION.   Gladstone Lighter M.D on 09/06/2014 at 2:24 PM  Between 7am to 6pm - Pager - [phone]  After 6pm go to [email or web address] - password [email or web address] [place] [facility]  Office  [phone]  CC: Primary care physician; Viviana Simpler, MD

## 2014-09-06 NOTE — Progress Notes (Signed)
  Subjective: 1 Day Post-Op Procedure(s) (LRB): ARTHROPLASTY BIPOLAR HIP (HEMIARTHROPLASTY) (Right) Patient reports pain as moderate.  Pt is unable to give a specific answer Patient is well, and has had no acute complaints or problems Plan is to go Nursing Home after hospital stay. Negative for chest pain and shortness of breath Fever: yes, most recent temp is 101. Gastrointestinal:Negative for nausea and vomiting  Objective: Vital signs in last 24 hours: Temp:  [98.3 F (36.8 C)-101 F (38.3 C)] 101 F (38.3 C) (08/31 0348) Pulse Rate:  [51-98] 94 (08/31 0348) Resp:  [16-20] 18 (08/31 0348) BP: (106-171)/(48-75) 146/58 mmHg (08/31 0348) SpO2:  [93 %-100 %] 93 % (08/31 0348)  Intake/Output from previous day:  Intake/Output Summary (Last 24 hours) at 09/06/14 0743 Last data filed at 09/06/14 0353  Gross per 24 hour  Intake 1248.75 ml  Output   1025 ml  Net 223.75 ml    Intake/Output this shift:    Labs:  Recent Labs  09/04/14 2109 09/05/14 0614 09/06/14 0705  HGB 12.7* 11.8* 11.8*    Recent Labs  09/05/14 0614 09/06/14 0705  WBC 11.3* 11.0*  RBC 3.95* 3.98*  HCT 36.3* 36.8*  PLT 111* 95*    Recent Labs  09/04/14 2109 09/05/14 0614  NA 138 139  K 4.3 3.9  CL 101 103  CO2 30 30  BUN 32* 32*  CREATININE 1.20 1.14  GLUCOSE 87 106*  CALCIUM 8.9 8.4*    Recent Labs  09/04/14 2109  INR 1.20    EXAM General - Patient is Disorganized, Confused and Lacking.  Pt unable to give specific answers.  Unsure of his location. Extremity - Neurologically intact Sensation intact distally Intact pulses distally Dorsiflexion/Plantar flexion intact Incision: scant drainage Dressing/Incision - blood tinged drainage Motor Function - intact, moving foot and toes well on exam.   Abdomen soft on exam without distention.  Normal BS without tympany.  Past Medical History  Diagnosis Date  . Gout   . CAD (coronary artery disease)   . DJD of shoulder   . UC  (ulcerative colitis)   . Asbestos exposure   . Hypercholesterolemia   . Esophageal stricture   . Orthostatic hypotension   . Personal history of colonic polyps 1991    villous adenoma of cecum  . Memory loss   . 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 Chronic kidney disease, stage III (moderate)   . Encounter for long-term (current) use of other medications   . Diverticulosis of colon (without mention of hemorrhage)   . Anxiety   . Colon cancer 1994    Assessment/Plan: 1 Day Post-Op Procedure(s) (LRB): ARTHROPLASTY BIPOLAR HIP (HEMIARTHROPLASTY) (Right) Principal Problem:   Fractured femoral neck  Estimated body mass index is 18.66 kg/(m^2) as calculated from the following:   Height as of this encounter: 6' (1.829 m).   Weight as of this encounter: 62.415 kg (137 lb 9.6 oz). Advance diet Up with therapy   Pt is disoriented this morning.  He is able to respond to basic questions but cannot answer specifics. Foley to be d/c today. Labs reviewed. CBC and BMP ordered for tomorrow morning. Most recent temp 101 and last HR 94.  WC 11.0.  If this continues will order UA.  DVT Prophylaxis - Lovenox, Foot Pumps and TED hose Weight-Bearing as tolerated to right leg  J. Cameron Proud, PA-C [facility] Orthopaedic Surgery 09/06/2014, 7:43 AM

## 2014-09-06 NOTE — Progress Notes (Signed)
Plan is for patient to D/C to [facility] likely Friday 09/08/14. Clinical Social Worker (CSW) met with patient's daughter Gay Filler and bedside and made her aware of above. CSW will continue to follow and assist as needed.   Blima Rich, [place] [phone]

## 2014-09-07 ENCOUNTER — Inpatient Hospital Stay: Payer: Medicare Other

## 2014-09-07 LAB — CBC
HEMATOCRIT: 35.1 % — AB (ref 40.0–52.0)
Hemoglobin: 11.5 g/dL — ABNORMAL LOW (ref 13.0–18.0)
MCH: 30.7 pg (ref 26.0–34.0)
MCHC: 32.9 g/dL (ref 32.0–36.0)
MCV: 93.4 fL (ref 80.0–100.0)
PLATELETS: 87 10*3/uL — AB (ref 150–440)
RBC: 3.76 MIL/uL — ABNORMAL LOW (ref 4.40–5.90)
RDW: 15.7 % — AB (ref 11.5–14.5)
WBC: 10.1 10*3/uL (ref 3.8–10.6)

## 2014-09-07 LAB — BASIC METABOLIC PANEL
Anion gap: 9 (ref 5–15)
BUN: 34 mg/dL — AB (ref 6–20)
CHLORIDE: 103 mmol/L (ref 101–111)
CO2: 26 mmol/L (ref 22–32)
CREATININE: 1.26 mg/dL — AB (ref 0.61–1.24)
Calcium: 8.7 mg/dL — ABNORMAL LOW (ref 8.9–10.3)
GFR calc Af Amer: 54 mL/min — ABNORMAL LOW (ref >60)
GFR, EST NON AFRICAN AMERICAN: 47 mL/min — AB (ref >60)
GLUCOSE: 101 mg/dL — AB (ref 65–99)
POTASSIUM: 3.6 mmol/L (ref 3.5–5.1)
SODIUM: 138 mmol/L (ref 135–145)

## 2014-09-07 LAB — SURGICAL PATHOLOGY

## 2014-09-07 MED ORDER — ASPIRIN 81 MG PO CHEW
81.0000 mg | CHEWABLE_TABLET | Freq: Every day | ORAL | Status: DC
  Administered 2014-09-07 – 2014-09-08 (×2): 81 mg via ORAL
  Filled 2014-09-07 (×2): qty 1

## 2014-09-07 NOTE — Progress Notes (Signed)
  Subjective: 2 Days Post-Op Procedure(s) (LRB): ARTHROPLASTY BIPOLAR HIP (HEMIARTHROPLASTY) (Right) Patient reports pain as moderate.   Patient is well, and has had no acute complaints or problems Plan is to go Skilled nursing facility after hospital stay. Negative for chest pain and shortness of breath Fever: Yes, most recent temp 99.3   Gastrointestinal:Negative for nausea and vomiting  Objective: Vital signs in last 24 hours: Temp:  [97.3 F (36.3 C)-99.6 F (37.6 C)] 97.3 F (36.3 C) (09/01 0542) Pulse Rate:  [81-90] 90 (09/01 0542) Resp:  [18] 18 (09/01 0542) BP: (146-177)/(56-76) 177/76 mmHg (09/01 0542) SpO2:  [99 %-100 %] 100 % (09/01 0542)  Intake/Output from previous day:  Intake/Output Summary (Last 24 hours) at 09/07/14 0941 Last data filed at 09/07/14 0846  Gross per 24 hour  Intake 2992.5 ml  Output    975 ml  Net 2017.5 ml    Intake/Output this shift: Total I/O In: 160 [P.O.:110; IV Piggyback:50] Out: -   Labs:  Recent Labs  09/04/14 2109 09/05/14 0614 09/06/14 0705 09/07/14 0539  HGB 12.7* 11.8* 11.8* 11.5*    Recent Labs  09/06/14 0705 09/07/14 0539  WBC 11.0* 10.1  RBC 3.98* 3.76*  HCT 36.8* 35.1*  PLT 95* 87*    Recent Labs  09/06/14 0705 09/07/14 0539  NA 140 138  K 4.0 3.6  CL 105 103  CO2 29 26  BUN 33* 34*  CREATININE 1.29* 1.26*  GLUCOSE 117* 101*  CALCIUM 8.4* 8.7*    Recent Labs  09/04/14 2109  INR 1.20     EXAM General - Patient is Alert and Oriented Extremity - Neurologically intact ABD soft Intact pulses distally Dorsiflexion/Plantar flexion intact Incision: scant drainage No cellulitis present Dressing/Incision - blood tinged drainage Motor Function - intact, moving foot and toes well on exam.   Abdomen is soft on exam with out distention or tympany.  Past Medical History  Diagnosis Date  . Gout   . CAD (coronary artery disease)   . DJD of shoulder   . UC (ulcerative colitis)   . Asbestos  exposure   . Hypercholesterolemia   . Esophageal stricture   . Orthostatic hypotension   . Personal history of colonic polyps 1991    villous adenoma of cecum  . Memory loss   . 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 Chronic kidney disease, stage III (moderate)   . Encounter for long-term (current) use of other medications   . Diverticulosis of colon (without mention of hemorrhage)   . Anxiety   . Colon cancer 1994    Assessment/Plan: 2 Days Post-Op Procedure(s) (LRB): ARTHROPLASTY BIPOLAR HIP (HEMIARTHROPLASTY) (Right) Principal Problem:   Fractured femoral neck  Estimated body mass index is 18.66 kg/(m^2) as calculated from the following:   Height as of this encounter: 6' (1.829 m).   Weight as of this encounter: 62.415 kg (137 lb 9.6 oz). Advance diet Up with therapy   Flomax started per Urology. Blood Cx and urine Cx ordered, pt receiving CXR today for fevers.  Most recent HR 87. UA not impressive for infection. Pt started on Vancomycin and Zosyn.; Pt is more talkative today and has improved cognitively. Pt has not had a BM.  Miralax and FLEET ordered  DVT Prophylaxis - Lovenox, Foot Pumps and TED hose Weight-Bearing as tolerated to right leg  J. Cameron Proud, PA-C [facility]opa[facility] 09/07/2014, 9:41 AM

## 2014-09-07 NOTE — Progress Notes (Signed)
Nutrition Follow-up   INTERVENTION:   Meals and Snacks: Cater to patient preferences; pt is a feeder, recommend encouragement at meal times with intake of at least supplements. Medical Food Supplement Therapy: Ensure Enlive po TID, each supplement provides 350 kcal and 20 grams of protein, as well as Magic Cup BID   NUTRITION DIAGNOSIS:   Inadequate oral intake related to inability to eat as evidenced by NPO status.  GOAL:   Patient will meet greater than or equal to 90% of their needs  MONITOR:    (Energy Intake, Digestive System, Anthropometrics, Electrolyte and renal Profile)  REASON FOR ASSESSMENT:   Consult, Malnutrition Screening Tool Hip fracture protocol  ASSESSMENT:   Pt POD2 right hemiarthroplasty. Pt sitting up in bed, very talkative on visit however confused.  Sitter at bedside.  Diet Order:  DIET SOFT Room service appropriate?: Yes; Fluid consistency:: Thin    Current Nutrition: Pt eating 20% of dinner last night recorded. Pt ate 4 bites of eggs, 1 bite of home fries, half of juice and sips of Ensure this am from breakfast tray. No family present on visit. Pt confused; could not tell writer if he was hungry or if he had eaten this am.   Gastrointestinal Profile: Last BM: unknown, abdomen soft nontender with active BS per documentation.   Medications: NS at 42mL/hr, Protonix, Colace  Electrolyte/Renal Profile and Glucose Profile:   Recent Labs Lab 09/05/14 0614 09/06/14 0705 09/07/14 0539  NA 139 140 138  K 3.9 4.0 3.6  CL 103 105 103  CO2 30 29 26   BUN 32* 33* 34*  CREATININE 1.14 1.29* 1.26*  CALCIUM 8.4* 8.4* 8.7*  GLUCOSE 106* 117* 101*   Protein Profile: No results for input(s): ALBUMIN in the last 168 hours.     Weight Trend since Admission: Filed Weights   09/04/14 1950 09/05/14 0111  Weight: 148 lb 5.9 oz (67.3 kg) 137 lb 9.6 oz (62.415 kg)     Skin:  Reviewed, no issues   BMI:  Body mass index is 18.66  kg/(m^2).  Estimated Nutritional Needs:   Kcal:  1712-2023kcals, BEE: 1297kcals, TEE: (IF 1.1-1.3)(AF 1.2)  Protein:  62-74g protein (1.0-1.2g/kg)  Fluid:  1560-1853mL of fluid (25-46mL/kg)  EDUCATION NEEDS:   Education needs no appropriate at this time   [place], RD, LDN Pager [phone]

## 2014-09-07 NOTE — Progress Notes (Signed)
Physical Therapy Treatment Patient Details Name: Dustin Byrd MRN: [id number] DOB: 02/03/1918 Today's Date: 09/07/2014    History of Present Illness Pt is a 81 y.o. male s/p fall at [facility] facility transferring self sustaining mildly impacted acute R femoral neck fx.  Pt s/p R hip unipolar hemiarthroplasty 09/05/14.  PMH:  Dementia, CAD s/p CABG.    PT Comments    Pt appearing more fatigued this afternoon but still demonstrated good effort with trying to take steps with RW and 2 assist.  Pt required increased time to perform activities and consistent vc's and assist to maintain posterior THP's.  Will continue to progress pt per pt tolerance.  Follow Up Recommendations  SNF     Equipment Recommendations       Recommendations for Other Services OT consult     Precautions / Restrictions Precautions Precautions: Fall;Posterior Hip Precaution Comments: 75 degrees hip flexion limitation Restrictions Weight Bearing Restrictions: Yes RLE Weight Bearing: Weight bearing as tolerated LLE Weight Bearing: Weight bearing as tolerated    Mobility  Bed Mobility Overal bed mobility: Needs Assistance;+2 for physical assistance Bed Mobility: Sit to Supine     Sit to supine: Max assist;+2 for physical assistance   General bed mobility comments: assist for trunk and B LE's  Transfers Overall transfer level: Needs assistance Equipment used: Rolling walker (2 wheeled) Transfers: Sit to/from Stand Sit to Stand: Min assist;Mod assist;+2 physical assistance         General transfer comment: pt required assist and multiple vc's to stand fully upright  Ambulation/Gait Ambulation/Gait assistance: Mod assist;+2 physical assistance Ambulation Distance (Feet): 3 Feet Assistive device: Rolling walker (2 wheeled)       General Gait Details: decreased stance time R LE; increased forward flexed posture with distance requiring assist and intermittent vc's to maintain upright; improved  ability moving L LE but quickly fatigues   Stairs            Wheelchair Mobility    Modified Rankin (Stroke Patients Only)       Balance Overall balance assessment: Needs assistance;History of Falls Sitting-balance support: Bilateral upper extremity supported;Feet supported Sitting balance-Leahy Scale: Poor Sitting balance - Comments: posterior lean   Standing balance support: Bilateral upper extremity supported;During functional activity Standing balance-Leahy Scale: Poor                      Cognition Arousal/Alertness: Lethargic;Suspect due to medications Behavior During Therapy: Impulsive;Restless Overall Cognitive Status: Impaired/Different from baseline Area of Impairment: Orientation Orientation Level: Disoriented to;Time;Place   Memory: Decreased recall of precautions;Decreased short-term memory              Exercises      General Comments General comments (skin integrity, edema, etc.): dressings intact      Pertinent Vitals/Pain Pain Assessment: Faces Pain Score: 5  Faces Pain Scale: Hurts little more Pain Location: R hip Pain Descriptors / Indicators: Guarding;Grimacing Pain Intervention(s): Limited activity within patient's tolerance;Monitored during session;Repositioned  O2 93% on room air and HR 85 bpm end of PT session.    Home Living                      Prior Function            PT Goals (current goals can now be found in the care plan section) Acute Rehab PT Goals Patient Stated Goal: To stand up and walk again PT Goal Formulation: With patient/family Time For Goal  Achievement: 09/20/14 Potential to Achieve Goals: Fair Progress towards PT goals: Progressing toward goals    Frequency  BID    PT Plan Current plan remains appropriate    Co-evaluation             End of Session Equipment Utilized During Treatment: Gait belt Activity Tolerance: Patient limited by fatigue;Patient limited by pain Patient  left: in bed;with call bell/phone within reach;with nursing/sitter in room (hip abduction wedge applied; sitter present )     Time: 3838-1840 PT Time Calculation (min) (ACUTE ONLY): 23 min  Charges:  $Therapeutic Exercise: 8-22 mins $Therapeutic Activity: 23-37 mins                    G CodesLeitha Bleak 09-21-14, 3:55 PM Leitha Bleak, [place]

## 2014-09-07 NOTE — Discharge Instructions (Signed)

## 2014-09-07 NOTE — Progress Notes (Signed)
Found patient urine output inadequate.  MD originally had advised to remove but called to advise no urine output thus far this shift.  Bladder scan revealed 557mL.  Advised to call urology and in meantime irrigate foley 96mL (up to two times).  Completed without any success in urine flow.  [facility] left detailed message for Carlyle Lipa, LNP who placed foley, pending call back.  Dr. Tressia Miners advised to leave foley, worried that removing foley in this state would cause patient to continue to have issues with urination.  Pending urology.

## 2014-09-07 NOTE — Progress Notes (Signed)
Daughter Amado Nash would like to be called when report is called to [facility] at discharge.  She lives 30 minutes away.

## 2014-09-07 NOTE — Progress Notes (Signed)
2 Days Post-Op Subjective: Patient reports suprapubic pain and hematuria. His foley last drained at 6am this morning  Objective: Vital signs in last 24 hours: Temp:  [97.3 F (36.3 C)-99.6 F (37.6 C)] 99 F (37.2 C) (09/01 1437) Pulse Rate:  [81-90] 83 (09/01 1437) Resp:  [18] 18 (09/01 1437) BP: (131-177)/(60-79) 131/71 mmHg (09/01 1437) SpO2:  [93 %-100 %] 93 % (09/01 1521)  Intake/Output from previous day: 08/31 0701 - 09/01 0700 In: 2832.5 [P.O.:120; I.V.:2462.5; IV Piggyback:250] Out: 975 [Urine:975] Intake/Output this shift: Total I/O In: 330 [P.O.:230; IV Piggyback:100] Out: -   Physical Exam:  General:alert, cooperative and appears stated age GI: not done and soft, non tender, normal bowel sounds, no palpable masses, no organomegaly, no inguinal hernia Male genitalia: not done no penile lesions or discharge no testicular masses Penis: normal, no lesions and circumcised Urethral Meatus: penile discharge: bloody Testicles: normal, no masses Scrotum: normal Bladder: bladder distension noted Resp: clear to auscultation bilaterally Extremities: extremities normal, atraumatic, no cyanosis or edema  Lab Results:  Recent Labs  09/05/14 0614 09/06/14 0705 09/07/14 0539  HGB 11.8* 11.8* 11.5*  HCT 36.3* 36.8* 35.1*   BMET  Recent Labs  09/06/14 0705 09/07/14 0539  NA 140 138  K 4.0 3.6  CL 105 103  CO2 29 26  GLUCOSE 117* 101*  BUN 33* 34*  CREATININE 1.29* 1.26*  CALCIUM 8.4* 8.7*    Recent Labs  09/04/14 2109  INR 1.20   No results for input(s): LABURIN in the last 72 hours. Results for orders placed or performed during the hospital encounter of 09/04/14  Surgical pcr screen     Status: None   Collection Time: 09/05/14  1:30 AM  Result Value Ref Range Status   MRSA, PCR NEGATIVE NEGATIVE Final   Staphylococcus aureus NEGATIVE NEGATIVE Final    Comment:        The Xpert SA Assay (FDA approved for NASAL specimens in patients over 21 years  of age), is one component of a comprehensive surveillance program.  Test performance has been validated by [facility] for patients greater than or equal to 64 year old. It is not intended to diagnose infection nor to guide or monitor treatment.   Culture, blood (routine x 2)     Status: None (Preliminary result)   Collection Time: 09/06/14  9:25 AM  Result Value Ref Range Status   Specimen Description BLOOD LEFT ASSIST CONTROL  Final   Special Requests   Final    BOTTLES DRAWN AEROBIC AND ANAEROBIC  5CC AERO 10 CC ANAERO   Culture NO GROWTH 1 DAY  Final   Report Status PENDING  Incomplete  Culture, blood (routine x 2)     Status: None (Preliminary result)   Collection Time: 09/06/14  9:34 AM  Result Value Ref Range Status   Specimen Description BLOOD LEFT WRIST  Final   Special Requests BOTTLES DRAWN AEROBIC AND ANAEROBIC  7 CC  Final   Culture NO GROWTH 1 DAY  Final   Report Status PENDING  Incomplete  Urine culture     Status: None (Preliminary result)   Collection Time: 09/06/14 10:17 AM  Result Value Ref Range Status   Specimen Description URINE, RANDOM  Final   Special Requests NONE  Final   Culture NO GROWTH < 24 HOURS  Final   Report Status PENDING  Incomplete    Studies/Results: Dg Chest 2 View  09/07/2014   CLINICAL DATA:  Fever status post recent  right hip hemiarthroplasty.  EXAM: CHEST  2 VIEW  COMPARISON:  09/04/2014 chest radiograph  FINDINGS: Median sternotomy wires are aligned and intact. CABG clips are noted throughout the mediastinum. Stable cardiomediastinal silhouette with mild cardiomegaly and mildly tortuous atherosclerotic thoracic aorta. No pneumothorax. There is stable pleural thickening at the lateral basilar right pleural space, which is a stable chronic finding back to the 2013. No evidence of an acute pleural effusion. Stable calcified granuloma in the left mid lung. Stable pleural-parenchymal scarring at the right lung apex. There is patchy lower lung  opacity overlying the lower thoracic spine on the lateral radiograph. No pulmonary edema. Moderate degenerative changes are present in the thoracic spine.  IMPRESSION: 1. Patchy lower lobe opacity on the lateral view, which may represent a lower lobe pneumonia. Advise posttreatment follow-up PA and lateral chest radiographs to document resolution. 2. Stable chronic lateral basilar right pleural thickening.   Electronically Signed   By: Ilona Sorrel M.D.   On: 09/07/2014 10:03    Assessment/Plan: 81 yo traumatic foley removal and hematuria   Recs: 1. 18 French coude placed and 700cc of clear urine drained 2. Trial of void in 5 days given urethral injury   LOS: 3 days   Latonda Larrivee L 09/07/2014, 6:40 PM

## 2014-09-07 NOTE — Progress Notes (Signed)
Spoke with Carlyle Lipa, LNP.  She advised to open foley connection, using sterile practice and flush with a small amount and see if urine starts to flow.  Unsuccessful.  Called back to advise to give further direction.  Patient starting to become uncomfortable and started moaning.  Penis appears to be slightly edematous.

## 2014-09-07 NOTE — Progress Notes (Signed)
Dr. Alyson Ingles rounded on patient and attempted to flush 1st foley that was placed by FNP that was previously flowing without difficulty and was unsuccessful with getting any return.  He removed 1st foley and appears to have been pulled down into urthera per MD and blood clot was on the tip of the catheter.  Patient has sitter due to pulling at lines/tubes/drains.  Mittens added for extra protection for lines.  Dr. Alyson Ingles placed 2nd Coude catheter and advised that this needs to remain in place for 5 days.

## 2014-09-07 NOTE — Progress Notes (Signed)
Plan is for patient to D/C to [facility] tomorrow 09/08/14. Patient's daughter Gay Filler is aware of above. Clinical Social Worker (CSW) will continue to follow and assist as needed.   Blima Rich, [place] [phone]

## 2014-09-07 NOTE — Progress Notes (Signed)
Physical Therapy Treatment Patient Details Name: Dustin Byrd MRN: [id number] DOB: [birth year] Today's Date: 09/07/2014    History of Present Illness Pt is a 81 y.o. male s/p fall at [facility] facility transferring self sustaining mildly impacted acute R femoral neck fx.  Pt s/p R hip unipolar hemiarthroplasty 09/05/14.  PMH:  Dementia, CAD s/p CABG.    PT Comments    Pt demonstrating improvement with ability to take steps with RW (with 2 assist) but still required vc's and assist for upright posture.  Pt able to participate in some LE ex's in bed.  Pt needs consistent vc's for posterior hip precautions.  Will continue to progress pt per pt progress.  Follow Up Recommendations  SNF     Equipment Recommendations       Recommendations for Other Services       Precautions / Restrictions Precautions Precautions: Fall;Posterior Hip Precaution Comments: 75 degrees hip flexion limitation Restrictions Weight Bearing Restrictions: Yes RLE Weight Bearing: Weight bearing as tolerated LLE Weight Bearing: Weight bearing as tolerated    Mobility  Bed Mobility Overal bed mobility: Needs Assistance;+2 for physical assistance Bed Mobility: Supine to Sit     Supine to sit: Max assist;+2 for physical assistance     General bed mobility comments: assist for trunk and B LE's  Transfers Overall transfer level: Needs assistance Equipment used: Rolling walker (2 wheeled) Transfers: Sit to/from Stand Sit to Stand: Min assist;Mod assist;+2 physical assistance         General transfer comment: pt required assist to stand fully upright  Ambulation/Gait Ambulation/Gait assistance: Mod assist;+2 physical assistance Ambulation Distance (Feet): 3 Feet Assistive device: Rolling walker (2 wheeled)       General Gait Details: decreased stance time R LE; increased forward flexed posture with distance requiring assist and intermittent vc's to maintain upright; improved ability moving L LE but  quickly fatigues   Stairs            Wheelchair Mobility    Modified Rankin (Stroke Patients Only)       Balance Overall balance assessment: Needs assistance;History of Falls Sitting-balance support: Bilateral upper extremity supported;Feet supported Sitting balance-Leahy Scale: Poor Sitting balance - Comments: posterior lean   Standing balance support: During functional activity;Bilateral upper extremity supported Standing balance-Leahy Scale: Poor                      Cognition Arousal/Alertness: Lethargic;Suspect due to medications Behavior During Therapy: Impulsive;Restless Overall Cognitive Status: Impaired/Different from baseline Area of Impairment: Orientation Orientation Level: Disoriented to;Time;Place   Memory: Decreased recall of precautions;Decreased short-term memory              Exercises   Performed semi-supine B LE therapeutic exercise x 10 reps:  Ankle pumps (AAROM B LE's); SAQ's (AROM R; AROM L); heelslides (AAROM R; AAROM L), hip abd/adduction (AAROM R; AAROM L).  Pt required vc's and tactile cues for correct technique with exercises.     General Comments   Nursing cleared pt for participation in physical therapy.  Pt agreeable to PT session.       Pertinent Vitals/Pain Pain Assessment: Faces Faces Pain Scale: Hurts little more Pain Location: R hip Pain Descriptors / Indicators: Guarding Pain Intervention(s): Limited activity within patient's tolerance;Monitored during session;Premedicated before session;Repositioned    Home Living                      Prior Function  PT Goals (current goals can now be found in the care plan section) Acute Rehab PT Goals Patient Stated Goal: To stand up and walk again PT Goal Formulation: With patient/family Time For Goal Achievement: 09/20/14 Potential to Achieve Goals: Fair Progress towards PT goals: Progressing toward goals    Frequency  BID    PT Plan Current  plan remains appropriate    Co-evaluation             End of Session Equipment Utilized During Treatment: Gait belt;Oxygen Activity Tolerance: Patient limited by fatigue;Patient limited by pain Patient left: in chair;with call bell/phone within reach;with nursing/sitter in room [facility] present)     Time: 2423-5361 PT Time Calculation (min) (ACUTE ONLY): 31 min  Charges:  $Therapeutic Exercise: 8-22 mins $Therapeutic Activity: 8-22 mins                    G CodesLeitha Bleak 09/19/2014, 1:55 PM Leitha Bleak, [place]

## 2014-09-07 NOTE — Progress Notes (Signed)
[place] [facility] [place] NAME: Dustin Byrd    MR#:  [id number]  DATE OF BIRTH:  Jun 14, 1918  SUBJECTIVE:  CHIEF COMPLAINT:   Chief Complaint  Patient presents with  . Fall   - s/p right hip hemiarthroplasty for femoral head fracture, POD # 2 today, Patient has dementia - - more alert today. - Febrile febrile all day yesterday, cultures done and started on antibiotics. Since then only low-grade fevers less than 100F noted. - Chest x-ray today.  REVIEW OF SYSTEMS:  Review of Systems  Unable to perform ROS: dementia    DRUG ALLERGIES:   Allergies  Allergen Reactions  . Aricept [Donepezil Hydrochloride]     agitation  . Cortisone   . Prednisone     Mania     VITALS:  Blood pressure 148/79, pulse 87, temperature 99.5 F (37.5 C), temperature source Axillary, resp. rate 18, height 6' (1.829 m), weight 62.415 kg (137 lb 9.6 oz), SpO2 99 %.  PHYSICAL EXAMINATION:  Physical Exam  GENERAL:  81 y.o.-year-old elderly patient lying in the bed with no acute distress.  EYES: Pupils equal, round, reactive to light and accommodation. No scleral icterus. Extraocular muscles intact.  HEENT: Head atraumatic, normocephalic. Oropharynx and nasopharynx clear.  Lip smacking/tremors, dystonia noted NECK:  Supple, no jugular venous distention. No thyroid enlargement, no tenderness.  LUNGS: Normal breath sounds bilaterally, no wheezing, rales,rhonchi or crepitation. No use of accessory muscles of respiration. Decreased bibasilar breath sounds CARDIOVASCULAR: S1, S2 normal. No rubs, or gallops. 3/6 systolic murmur present. ABDOMEN: Soft, nontender, nondistended. Bowel sounds present. No organomegaly or mass.  EXTREMITIES: No pedal edema, cyanosis, or clubbing.  NEUROLOGIC: Cranial nerves II through XII are intact. Unable to follow commands, hand grips are strong, guarding RLE due to pain.  Sensation intact. Gait not checked.  PSYCHIATRIC: The  patient is alert and not oriented. More communicative today SKIN: No obvious rash, lesion, or ulcer.    LABORATORY PANEL:   CBC  Recent Labs Lab 09/07/14 0539  WBC 10.1  HGB 11.5*  HCT 35.1*  PLT 87*   ------------------------------------------------------------------------------------------------------------------  Chemistries   Recent Labs Lab 09/07/14 0539  NA 138  K 3.6  CL 103  CO2 26  GLUCOSE 101*  BUN 34*  CREATININE 1.26*  CALCIUM 8.7*   ------------------------------------------------------------------------------------------------------------------  Cardiac Enzymes No results for input(s): TROPONINI in the last 168 hours. ------------------------------------------------------------------------------------------------------------------  RADIOLOGY:  Dg Chest 2 View  09/07/2014   CLINICAL DATA:  Fever status post recent right hip hemiarthroplasty.  EXAM: CHEST  2 VIEW  COMPARISON:  09/04/2014 chest radiograph  FINDINGS: Median sternotomy wires are aligned and intact. CABG clips are noted throughout the mediastinum. Stable cardiomediastinal silhouette with mild cardiomegaly and mildly tortuous atherosclerotic thoracic aorta. No pneumothorax. There is stable pleural thickening at the lateral basilar right pleural space, which is a stable chronic finding back to the 2013. No evidence of an acute pleural effusion. Stable calcified granuloma in the left mid lung. Stable pleural-parenchymal scarring at the right lung apex. There is patchy lower lung opacity overlying the lower thoracic spine on the lateral radiograph. No pulmonary edema. Moderate degenerative changes are present in the thoracic spine.  IMPRESSION: 1. Patchy lower lobe opacity on the lateral view, which may represent a lower lobe pneumonia. Advise posttreatment follow-up PA and lateral chest radiographs to document resolution. 2. Stable chronic lateral basilar right pleural thickening.   Electronically Signed    By: Ilona Sorrel  M.D.   On: 09/07/2014 10:03   Dg Hip Port Unilat With Pelvis 1v Right  09/05/2014   CLINICAL DATA:  81 year old male status post right hip arthroplasty for proximal femur fracture. Initial encounter.  EXAM: DG HIP (WITH OR WITHOUT PELVIS) 1V PORT RIGHT  COMPARISON:  Preoperative study 09/04/2014  FINDINGS: Portable AP and cross-table lateral views of the right hip. Bipolar type arthroplasty in place. Hardware components appear intact and normally aligned. Overlying skin staples and other postoperative changes to the soft tissues. No unexpected osseous changes identified. Visible pelvis appears stable.  IMPRESSION: Right hip arthroplasty with no adverse features.   Electronically Signed   By: Genevie Ann M.D.   On: 09/05/2014 17:48    EKG:   Orders placed or performed during the hospital encounter of 09/04/14  . ED EKG  . ED EKG  . EKG 12-Lead  . EKG 12-Lead    ASSESSMENT AND PLAN:   89y/oM with PMH of CAD s/p CABG, CKD stage3, Dementia, Anxiety from [facility], admitted after a fall and right femoral head fracture.  #1 right femoral fracture- s/p right hip hemiarthroplasty, POD #2 today -Orthopedic consult, pain management. -Postoperative DVT prophylaxis and physical therapy. -Discharged to rehabilitation tomorrow if medically stable  #2 coronary artery disease-status post CABG-no angina symptoms. -Continue cardiac medications - Re-Started his aspirin  #3 acute urinary retention-appreciate urology input in placing the Foley catheter. -Flomax started per their recommendations. -Discontinue Foley catheter today and reevaluate  #4 Fevers-  - blood cultures and urine cultures are negative so far. - UA not very impressive for infection -Discontinue Foley catheter today. Currently on vancomycin and Zosyn - Chest x-ray showing possible left lower lobe infiltrate. Likely source of his fevers. -Continue to monitor while on antibiotics. Incentive spirometry  advised. - fevers started prior to the surgery  #5 history of ulcerative colitis-stable on mesalamine  #6 GERD-continue Protonix.  #7 history of dementia-continue home medications. Currently, calm and cooperative. Watch for any postop delirium.  #8 DVT Prophylaxis- On lovenox  All the records are reviewed and case discussed with Care Management/Social Workerr. Management plans discussed with the patient, family and they are in agreement.  CODE STATUS: DNR  TOTAL TIME TAKING CARE OF THIS PATIENT: 38 minutes.   POSSIBLE D/C TOMORROW TO REHABILITATION, DEPENDING ON CLINICAL CONDITION.   Gladstone Lighter M.D on 09/07/2014 at 2:29 PM  Between 7am to 6pm - Pager - [phone]  After 6pm go to [email or web address] - password [email or web address] [facility]  Office  [phone]  CC: Primary care physician; Viviana Simpler, MD

## 2014-09-08 DIAGNOSIS — F0280 Dementia in other diseases classified elsewhere without behavioral disturbance: Secondary | ICD-10-CM

## 2014-09-08 DIAGNOSIS — J189 Pneumonia, unspecified organism: Secondary | ICD-10-CM | POA: Diagnosis not present

## 2014-09-08 DIAGNOSIS — Z8781 Personal history of (healed) traumatic fracture: Secondary | ICD-10-CM | POA: Diagnosis not present

## 2014-09-08 DIAGNOSIS — M109 Gout, unspecified: Secondary | ICD-10-CM | POA: Diagnosis not present

## 2014-09-08 DIAGNOSIS — R338 Other retention of urine: Secondary | ICD-10-CM

## 2014-09-08 DIAGNOSIS — F0390 Unspecified dementia without behavioral disturbance: Secondary | ICD-10-CM | POA: Diagnosis not present

## 2014-09-08 DIAGNOSIS — Z471 Aftercare following joint replacement surgery: Secondary | ICD-10-CM | POA: Diagnosis not present

## 2014-09-08 DIAGNOSIS — K5190 Ulcerative colitis, unspecified, without complications: Secondary | ICD-10-CM

## 2014-09-08 DIAGNOSIS — R419 Unspecified symptoms and signs involving cognitive functions and awareness: Secondary | ICD-10-CM | POA: Diagnosis not present

## 2014-09-08 DIAGNOSIS — R5081 Fever presenting with conditions classified elsewhere: Secondary | ICD-10-CM

## 2014-09-08 DIAGNOSIS — R339 Retention of urine, unspecified: Secondary | ICD-10-CM | POA: Diagnosis not present

## 2014-09-08 DIAGNOSIS — K219 Gastro-esophageal reflux disease without esophagitis: Secondary | ICD-10-CM

## 2014-09-08 DIAGNOSIS — S7291XA Unspecified fracture of right femur, initial encounter for closed fracture: Secondary | ICD-10-CM | POA: Diagnosis not present

## 2014-09-08 DIAGNOSIS — I2510 Atherosclerotic heart disease of native coronary artery without angina pectoris: Secondary | ICD-10-CM | POA: Diagnosis not present

## 2014-09-08 DIAGNOSIS — G309 Alzheimer's disease, unspecified: Secondary | ICD-10-CM

## 2014-09-08 DIAGNOSIS — S72001A Fracture of unspecified part of neck of right femur, initial encounter for closed fracture: Secondary | ICD-10-CM | POA: Diagnosis not present

## 2014-09-08 DIAGNOSIS — I951 Orthostatic hypotension: Secondary | ICD-10-CM | POA: Diagnosis not present

## 2014-09-08 DIAGNOSIS — M6281 Muscle weakness (generalized): Secondary | ICD-10-CM | POA: Diagnosis not present

## 2014-09-08 DIAGNOSIS — R233 Spontaneous ecchymoses: Secondary | ICD-10-CM | POA: Diagnosis not present

## 2014-09-08 DIAGNOSIS — M25551 Pain in right hip: Secondary | ICD-10-CM | POA: Diagnosis not present

## 2014-09-08 DIAGNOSIS — Z967 Presence of other bone and tendon implants: Secondary | ICD-10-CM | POA: Diagnosis not present

## 2014-09-08 DIAGNOSIS — T148 Other injury of unspecified body region: Secondary | ICD-10-CM | POA: Diagnosis not present

## 2014-09-08 LAB — BASIC METABOLIC PANEL
Anion gap: 7 (ref 5–15)
BUN: 36 mg/dL — AB (ref 6–20)
CALCIUM: 8.7 mg/dL — AB (ref 8.9–10.3)
CHLORIDE: 102 mmol/L (ref 101–111)
CO2: 31 mmol/L (ref 22–32)
CREATININE: 1.21 mg/dL (ref 0.61–1.24)
GFR calc non Af Amer: 49 mL/min — ABNORMAL LOW (ref >60)
GFR, EST AFRICAN AMERICAN: 57 mL/min — AB (ref >60)
Glucose, Bld: 95 mg/dL (ref 65–99)
Potassium: 3.4 mmol/L — ABNORMAL LOW (ref 3.5–5.1)
SODIUM: 140 mmol/L (ref 135–145)

## 2014-09-08 LAB — URINE CULTURE: Culture: NO GROWTH

## 2014-09-08 MED ORDER — LORAZEPAM 0.5 MG PO TABS: 0.2500 mg | ORAL_TABLET | Freq: Three times a day (TID) | ORAL | Status: AC | PRN

## 2014-09-08 MED ORDER — POTASSIUM CHLORIDE CRYS ER 20 MEQ PO TBCR
20.0000 meq | EXTENDED_RELEASE_TABLET | Freq: Two times a day (BID) | ORAL | Status: DC
  Administered 2014-09-08: 20 meq via ORAL
  Filled 2014-09-08: qty 1

## 2014-09-08 MED ORDER — FINASTERIDE 5 MG PO TABS: 5.0000 mg | ORAL_TABLET | Freq: Every day | ORAL | Status: AC

## 2014-09-08 MED ORDER — FLEET ENEMA 7-19 GM/118ML RE ENEM
1.0000 | ENEMA | Freq: Once | RECTAL | Status: AC
  Administered 2014-09-08: 1 via RECTAL

## 2014-09-08 MED ORDER — TAMSULOSIN HCL 0.4 MG PO CAPS: 0.4000 mg | ORAL_CAPSULE | Freq: Every day | ORAL | Status: AC

## 2014-09-08 MED ORDER — OXYCODONE HCL 5 MG PO TABS: 5.0000 mg | ORAL_TABLET | ORAL | Status: DC | PRN

## 2014-09-08 MED ORDER — ENOXAPARIN SODIUM 40 MG/0.4ML ~~LOC~~ SOLN: 40.0000 mg | SUBCUTANEOUS | Status: DC

## 2014-09-08 MED ORDER — LORAZEPAM 0.5 MG PO TABS: 0.2500 mg | ORAL_TABLET | ORAL | Status: DC

## 2014-09-08 NOTE — Progress Notes (Signed)
Subjective: 3 Days Post-Op Procedure(s) (LRB): ARTHROPLASTY BIPOLAR HIP (HEMIARTHROPLASTY) (Right) Patient reports pain as mild.   Patient is well, and has had no acute complaints or problems Plan is to go Skilled nursing facility after hospital stay. Negative for chest pain and shortness of breath Fever: no Gastrointestinal:Negative for nausea and vomiting  Objective: Vital signs in last 24 hours: Temp:  [98.9 F (37.2 C)-99.5 F (37.5 C)] 98.9 F (37.2 C) (09/02 0510) Pulse Rate:  [77-87] 77 (09/02 0510) Resp:  [16-18] 16 (09/01 1955) BP: (131-157)/(53-79) 152/53 mmHg (09/02 0510) SpO2:  [93 %-100 %] 95 % (09/02 0510)  Intake/Output from previous day:  Intake/Output Summary (Last 24 hours) at 09/08/14 0721 Last data filed at 09/08/14 0506  Gross per 24 hour  Intake    530 ml  Output    850 ml  Net   -320 ml    Intake/Output this shift:    Labs:  Recent Labs  09/06/14 0705 09/07/14 0539  HGB 11.8* 11.5*    Recent Labs  09/06/14 0705 09/07/14 0539  WBC 11.0* 10.1  RBC 3.98* 3.76*  HCT 36.8* 35.1*  PLT 95* 87*    Recent Labs  09/07/14 0539 09/08/14 0526  NA 138 140  K 3.6 3.4*  CL 103 102  CO2 26 31  BUN 34* 36*  CREATININE 1.26* 1.21  GLUCOSE 101* 95  CALCIUM 8.7* 8.7*   No results for input(s): LABPT, INR in the last 72 hours.   EXAM General - Patient is Alert, Disorganized and Lacking.  Cognitive abilities similar to yesterday's exam. Extremity - ABD soft Sensation intact distally Dorsiflexion/Plantar flexion intact Incision: scant drainage No cellulitis present Dressing/Incision - blood tinged drainage Motor Function - intact, moving foot and toes well on exam.  Pt has history of dementia.  Able to answer general questions but not specifics.  Past Medical History  Diagnosis Date  . Gout   . CAD (coronary artery disease)   . DJD of shoulder   . UC (ulcerative colitis)   . Asbestos exposure   . Hypercholesterolemia   .  Esophageal stricture   . Orthostatic hypotension   . Personal history of colonic polyps 1991    villous adenoma of cecum  . Memory loss   . 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 Chronic kidney disease, stage III (moderate)   . Encounter for long-term (current) use of other medications   . Diverticulosis of colon (without mention of hemorrhage)   . Anxiety   . Colon cancer 1994    Assessment/Plan: 3 Days Post-Op Procedure(s) (LRB): ARTHROPLASTY BIPOLAR HIP (HEMIARTHROPLASTY) (Right) Principal Problem:   Fractured femoral neck  Estimated body mass index is 18.66 kg/(m^2) as calculated from the following:   Height as of this encounter: 6' (1.829 m).   Weight as of this encounter: 62.415 kg (137 lb 9.6 oz). Advance diet Up with therapy   Up with PT today. CXR yesterday demonstrated a patchy lower lobe opacity on the lateral view, which may represent a lower lobe pneumonia.  Pt on Zosyn and Vancomycin.  Most recent temp 98.9 and HR 77. K+ 3.4 this morning.  Will place order for klor-Con 20 mEq BID for one day. Pt has not had a BM yet.  Instructed nurse to deliver FLEET enema today. Foley in place per urology. Discharge pending medical clearance.  DVT Prophylaxis - Lovenox, Foot Pumps and TED hose Weight-Bearing as tolerated to  right leg  J. Cameron Proud, PA-C [facility] Orthopaedic Surgery 09/08/2014, 7:21 AM

## 2014-09-08 NOTE — Progress Notes (Signed)
Dr. Reece Levy gave order for pt. To have safety sitter at the bedside.

## 2014-09-08 NOTE — Progress Notes (Signed)
Patient is medically stable for D/C to [facility] today. Per Seth Bake admissions coordinator at [facility] patient is going to room 329. RN will call report at [phone] and arrange EMS for transport. Clinical Education officer, museum (CSW) prepared D/C packet and sent D/C Summary and follow up appointment to [facility] via care finder. Patient's daughter Gay Filler is at bedside and aware of above. Please reconsult if future social work needs arise. CSW signing off.   Blima Rich, [place] [phone]

## 2014-09-08 NOTE — Care Management Important Message (Signed)
Important Message  Patient Details  Name: Dustin Byrd MRN: [id number] Date of Birth: 10-05-1918   Medicare Important Message Given:  Yes-third notification given    Juliann Pulse A Allmond 09/08/2014, 9:16 AM

## 2014-09-08 NOTE — Progress Notes (Signed)
Report called to Dena at facility, EMS called, waiting on transport.

## 2014-09-08 NOTE — Evaluation (Signed)
Clinical/Bedside Swallow Evaluation Patient Details  Name: DEONDRICK SEARLS MRN: [id number] Date of Birth: 04/18/1918  Today's Date: 09/08/2014 Time: SLP Start Time (ACUTE ONLY): 1100 SLP Stop Time (ACUTE ONLY): 1200 SLP Time Calculation (min) (ACUTE ONLY): 60 min  Past Medical History:  Past Medical History  Diagnosis Date  . Gout   . CAD (coronary artery disease)   . DJD of shoulder   . UC (ulcerative colitis)   . Asbestos exposure   . Hypercholesterolemia   . Esophageal stricture   . Orthostatic hypotension   . Personal history of colonic polyps 1991    villous adenoma of cecum  . Memory loss   . 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 Chronic kidney disease, stage III (moderate)   . Encounter for long-term (current) use of other medications   . Diverticulosis of colon (without mention of hemorrhage)   . Anxiety   . Colon cancer 1994   Past Surgical History:  Past Surgical History  Procedure Laterality Date  . Hemicolectomy  1991    right  . Colostomy takedown  1996  . Open heart surgery      CABG x4  . Coronary artery bypass graft      x4   HPI:  Pt is a 81 y.o. male with a known history of dementia, coronary artery disease as a CABG presenting after mechanical fall at his NH. Pt also has a h/o Esophageal stricture per chart/Dtr in which he was diltated; currently on a PPI per chart. Pt is awake, verbally conversive w/ confusion noted(baseline Dementia).   Assessment / Plan / Recommendation Clinical Impression  Pt appears to present w/ adequate toleration of an oral diet of mech soft-puree foods w/ thin liquids following strict aspiration precautions and monitoring bolus sizes and given time for oral clearing/swallowing. Pt does exhibit min. slower oral phase moreso w/ increased textures which may be impacted by his lacking sufficient dentition for mastication and d/t his baseline, declined Cognitive  status/Dementia. Pt would benefit from 100% supervision and assistance w/ monitoring of oral intake during meals; meds in Puree w/ NSG; aspiration precautions. The results and recs. were discussed w/ Dtr who agreed.    Aspiration Risk  Mild    Diet Recommendation Dysphagia 3 (Mech soft);Thin  1. Would recommend mashing soft foods down more w/ fork/utensil to soften. 2. Would recommend use of puree foods in his daily diet such as pudding, mashed potatoes, Yogurts, and ice cream - pt likes ice cream.   Medication Administration: Crushed with puree (as able or in liquid form) Compensations: Minimize environmental distractions;Slow rate;Small sips/bites    Other  Recommendations Recommended Consults:  (Dietician) Oral Care Recommendations: Oral care BID;Staff/trained caregiver to provide oral care;Oral care before and after PO   Follow Up Recommendations       Frequency and Duration min 3x week  1 week   Pertinent Vitals/Pain Did not appear to be in pain    SLP Swallow Goals  see care plan   Swallow Study Prior Functional Status   resides at [facility]; assistance w/ ADLs    General Date of Onset: 09/04/14 Other Pertinent Information: Pt is a 81 y.o. male with a known history of dementia, coronary artery disease as a CABG presenting after mechanical fall at his NH. Pt also has a h/o Esophageal stricture per chart/Dtr in which he was diltated; currently on a PPI per chart. Pt is awake, verbally  conversive w/ confusion noted(baseline Dementia). Type of Study: Bedside swallow evaluation Previous Swallow Assessment: none Diet Prior to this Study: Dysphagia 3 (soft);Thin liquids (per Dtr) Temperature Spikes Noted: No (wbc 10.1) Respiratory Status: Room air History of Recent Intubation: No Behavior/Cognition: Alert;Cooperative;Pleasant mood;Confused;Requires cueing Oral Cavity - Dentition: Missing dentition Self-Feeding Abilities: Total assist Patient Positioning: Upright in bed Baseline Vocal  Quality: Normal Volitional Cough: Strong Volitional Swallow: Able to elicit    Oral/Motor/Sensory Function Overall Oral Motor/Sensory Function: Appears within functional limits for tasks assessed Labial ROM: Within Functional Limits Labial Symmetry: Within Functional Limits Labial Strength: Within Functional Limits Lingual ROM: Within Functional Limits Lingual Symmetry: Within Functional Limits Lingual Strength: Within Functional Limits Facial Symmetry: Within Functional Limits Mandible: Within Functional Limits   Ice Chips Ice chips: Within functional limits Presentation: Spoon (fed; 2 trials)   Thin Liquid Thin Liquid: Within functional limits Presentation: Straw;Cup;Self Fed (assisted; 8 trials) Oral Phase Impairments:  (min. decreased awareness) Oral Phase Functional Implications:  (none) Pharyngeal  Phase Impairments:  (none) Other Comments: boluses were monitored for small, single sips    Nectar Thick Nectar Thick Liquid: Not tested   Honey Thick Honey Thick Liquid: Not tested   Puree Puree: Impaired Presentation: Spoon (fed; 6 trials) Oral Phase Functional Implications: Prolonged oral transit Pharyngeal Phase Impairments:  (none)   Solid   GO    Solid: Impaired Presentation: Spoon (fed; 3 trials) Oral Phase Functional Implications:  (slower oral phase time) Pharyngeal Phase Impairments:  (none) Other Comments: food was mashed more for pt using the fork      Orinda Kenner, MS, CCC-SLP  Watson,Katherine 09/08/2014,1:39 PM

## 2014-09-08 NOTE — Progress Notes (Signed)
Physical Therapy Treatment Patient Details Name: Dustin Byrd MRN: [id number] DOB: [birth year] Today's Date: 09/08/2014    History of Present Illness Pt is a 81 y.o. male s/p fall at [facility] facility transferring self sustaining mildly impacted acute R femoral neck fx.  Pt s/p R hip unipolar hemiarthroplasty 09/05/14.  PMH:  Dementia, CAD s/p CABG.    PT Comments    Pt appearing more drowsy today (pt was just cleaned up after bowel movement and appeared fatigued).  Pt required increased assist for LE supine ex's in bed d/t drowsiness but still appeared pleasant and pt attempted to participate.  Plan for discharge today.  Follow Up Recommendations  SNF     Equipment Recommendations       Recommendations for Other Services OT consult     Precautions / Restrictions Precautions Precautions: Fall;Posterior Hip Precaution Comments: 75 degrees hip flexion limitation Restrictions Weight Bearing Restrictions: Yes RLE Weight Bearing: Weight bearing as tolerated LLE Weight Bearing: Weight bearing as tolerated    Mobility  Bed Mobility               General bed mobility comments: Deferred OOB d/t pt drowsy and plan to discharge to STR today  Transfers                    Ambulation/Gait                 Stairs            Wheelchair Mobility    Modified Rankin (Stroke Patients Only)       Balance                                    Cognition Arousal/Alertness: Lethargic;Suspect due to medications Behavior During Therapy:  (Drowsy) Overall Cognitive Status: Impaired/Different from baseline Area of Impairment: Orientation Orientation Level: Disoriented to;Time;Place   Memory: Decreased recall of precautions;Decreased short-term memory              Exercises   Performed semi-supine B LE therapeutic exercise x 15 reps:  Ankle pumps (AROM B LE's); SAQ's (AAROM R; AAROM L); heelslides (AAROM R; AAROM L), hip abd/adduction  (AAROM R; AAROM L).  Pt required vc's and tactile cues for correct technique with exercises.     General Comments   Nursing cleared pt for participation in physical therapy.  Pt agreeable to PT session.  Sitter present.      Pertinent Vitals/Pain Pain Assessment: Faces Pain Score: 4  Pain Location: R hip Pain Descriptors / Indicators: Guarding Pain Intervention(s): Limited activity within patient's tolerance;Monitored during session;Premedicated before session;Repositioned  Vitals stable and WFL throughout treatment session.    Home Living                      Prior Function            PT Goals (current goals can now be found in the care plan section) Acute Rehab PT Goals Patient Stated Goal: To stand up and walk again PT Goal Formulation: With patient/family Time For Goal Achievement: 09/20/14 Potential to Achieve Goals: Fair Progress towards PT goals: Not progressing toward goals - comment (pt more drowsy today)    Frequency  BID    PT Plan Current plan remains appropriate    Co-evaluation             End of Session  Equipment Utilized During Treatment: Gait belt Activity Tolerance: Patient limited by lethargy;Patient limited by fatigue Patient left: in bed;with call bell/phone within reach;with bed alarm set;with nursing/sitter in room (B heels elevated via pillow and hip abduction pillow put in place)     Time: 0370-4888 PT Time Calculation (min) (ACUTE ONLY): 13 min  Charges:  $Therapeutic Exercise: 8-22 mins                    G CodesLeitha Bleak 10-06-14, 11:27 AM Leitha Bleak, [place]

## 2014-09-08 NOTE — Discharge Summary (Signed)
Dustin Byrd at [facility] NAME: Dustin Byrd    MR#:  [id number]  DATE OF BIRTH:  January 23, 1918  DATE OF ADMISSION:  09/04/2014 ADMITTING PHYSICIAN: Lytle Butte, MD  DATE OF DISCHARGE: No discharge date for patient encounter.  PRIMARY CARE PHYSICIAN: Viviana Simpler, MD     ADMISSION DIAGNOSIS:  Femoral neck fracture, right, closed, initial encounter [S72.001A]  DISCHARGE DIAGNOSIS:  Principal Problem:   Fractured femoral neck Active Problems:   Acute urinary retention   CAP (community acquired pneumonia)   Fever presenting with conditions classified elsewhere   Ulcerative colitis   GERD (gastroesophageal reflux disease)   Alzheimer's dementia   SECONDARY DIAGNOSIS:   Past Medical History  Diagnosis Date  . Gout   . CAD (coronary artery disease)   . DJD of shoulder   . UC (ulcerative colitis)   . Asbestos exposure   . Hypercholesterolemia   . Esophageal stricture   . Orthostatic hypotension   . Personal history of colonic polyps 1991    villous adenoma of cecum  . Memory loss   . 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 Chronic kidney disease, stage III (moderate)   . Encounter for long-term (current) use of other medications   . Diverticulosis of colon (without mention of hemorrhage)   . Anxiety   . Colon cancer 1994    .pro HOSPITAL COURSE:   Patient is a 81 year old Caucasian male with history of ulcerative colitis, coronary artery disease, esophageal stricture, dementia who presents to the hospital after fall in nursing facility. On arrival to emergency room, patient was noted to have acute right femoral neck fracture. Patient was taken to operating room on 09/05/2014 by Dr. Roland Rack and had right hip unipolar hemiarthroplasty done. Perioperatively  patient was noted to have high fevers as high as 101, he had a chest x-ray repeated and was found to have  patchy lower lobe opacity on lateral view which may represent lower lobe pneumonia per radiologist. Patient was initiated on broad-spectrum antibiotic therapy, initially vancomycin, as well as Zosyn and her blood cultures were taken. Blood cultures showed no growth in 2 days. Patient antibiotic is being changed to levofloxacin to continue to complete course. Speech therapist is to evaluate patient today to rule out aspiration. Postoperatively, patient was also noted to have acute urinary retention requiring Foley catheter placement. Catheter was withdrawn later on, however, patient still had difficulty voiding so catheter was replaced. Patient was initiated on Flomax and finasteride and his Foley catheter should be withdrawn and approximately 1 week for voiding evaluation.  Discussion by problem 89y/oM with PMH of CAD s/p CABG, CKD stage3, Dementia, Anxiety from [facility], admitted after a fall and right femoral head fracture.  #1 right femoral fracture- s/p right hip hemiarthroplasty, POD #3 today -Orthopedic consult is appreciated, pain management is satisfactory. -Postoperative DVT prophylaxis with Lovenox subcutaneously for 2 week therapy and physical therapy in skilled nursing facility. -Discharged to rehabilitation today as medically stable  #2 coronary artery disease-status post CABG-no angina symptoms. -Continue cardiac medications - Restarted aspirin  #3 acute urinary retention-appreciate urology input in placing the Foley catheter, Foley catheter was withdrawn but then replaced again. -Flomax and finasteride to be continued per urology recommendation. -Discontinue Foley catheter in about 1 week and reevaluate  #4 Fevers-  - blood cultures and urine cultures are negative so far. - UA not very  impressive for infection -Discontinue vancomycin and Zosyn, start patient on levofloxacin to be continued for 7 days - Chest x-ray showing possible lower lobe infiltrate.  Likely source of his fevers. Patient will be seen by a speech therapist to evaluate his swallowing and rule out dysphagia-induced aspiration pneumonitis -Continue to monitor while on antibiotics. Incentive spirometry advised. - fevers started prior to the surgery  #5 history of ulcerative colitis-stable on mesalamine  #6 GERD-continue Protonix.  #7 history of dementia-continue home medications. Currently, calm and cooperative. No signs for  postop delirium.  #8 DVT Prophylaxis- On lovenox    DISCHARGE CONDITIONS:   Stable  CONSULTS OBTAINED:  Treatment Team:  Lytle Butte, MD Corky Mull, MD Ardis Hughs, MD Cleon Gustin, MD  DRUG ALLERGIES:   Allergies  Allergen Reactions  . Aricept [Donepezil Hydrochloride]     agitation  . Cortisone   . Prednisone     Mania     DISCHARGE MEDICATIONS:   Current Discharge Medication List    START taking these medications   Details  enoxaparin (LOVENOX) 40 MG/0.4ML injection Inject 0.4 mLs (40 mg total) into the skin daily. Qty: 14 Syringe, Refills: 0    finasteride (PROSCAR) 5 MG tablet Take 1 tablet (5 mg total) by mouth daily. Qty: 30 tablet, Refills: 0    oxyCODONE (OXY IR/ROXICODONE) 5 MG immediate release tablet Take 1-2 tablets (5-10 mg total) by mouth every 4 (four) hours as needed for breakthrough pain. Qty: 60 tablet, Refills: 0    tamsulosin (FLOMAX) 0.4 MG CAPS capsule Take 1 capsule (0.4 mg total) by mouth daily. Qty: 30 capsule, Refills: 4      CONTINUE these medications which have CHANGED   Details  !! LORazepam (ATIVAN) 0.5 MG tablet Take 0.5 tablets (0.25 mg total) by mouth every morning. Qty: 30 tablet, Refills: 0    !! LORazepam (ATIVAN) 0.5 MG tablet Take 0.5 tablets (0.25 mg total) by mouth every 8 (eight) hours as needed for anxiety. 1/2 tab up to 3 times daily as needed for anxiety Qty: 30 tablet, Refills: 0     !! - Potential duplicate medications found. Please discuss with  provider.    CONTINUE these medications which have NOT CHANGED   Details  acetaminophen (TYLENOL) 325 MG tablet Take 650 mg by mouth once as needed for fever (increased pain or temp.).    allopurinol (ZYLOPRIM) 100 MG tablet TAKE 1 TABLET BY MOUTH ONCE DAILY FOR GOUT Qty: 30 tablet, Refills: 11    aspirin 81 MG chewable tablet TAKE 1 TABLET BY MOUTH EACH DAY. ANTI PLATELET AGGREGATION Qty: 30 tablet, Refills: 11    citalopram (CELEXA) 20 MG tablet Take 20 mg by mouth every morning.    COLCRYS 0.6 MG tablet TAKE 1 TABLET BY MOUTH ONCE DAILY FOR GOUT Qty: 30 tablet, Refills: 11    fludrocortisone (FLORINEF) 0.1 MG tablet Take 0.1 mg by mouth every morning.     fluticasone (FLONASE) 50 MCG/ACT nasal spray Place 2 sprays into both nostrils every evening.     mineral oil enema Place 1 enema rectally once a week. Once a week as needed for constipation.    nitroGLYCERIN (NITROSTAT) 0.4 MG SL tablet Place 1 tablet (0.4 mg total) under the tongue every 5 (five) minutes as needed. For chest pain. Call 911 after 3 doses if no relief. Qty: 25 tablet, Refills: 1    PENTASA 250 MG CR capsule TAKE 1 CAPSULE BY MOUTH FOUR TIMES A DAY  FOR ULCERATIVE COLITIS. Qty: 120 capsule, Refills: 11    polyethylene glycol (MIRALAX / GLYCOLAX) packet Take 17 g by mouth 2 (two) times daily. Qty: 200 each, Refills: 3    sennosides-docusate sodium (SENOKOT-S) 8.6-50 MG tablet TAKE 2 TABLETS BY MOUTH TWICE A DAY FOR STOOL SOFTENER. TAKE WITH GLASS OF WATER. Qty: 120 tablet, Refills: 11    Dermatological Products, Misc. (ITCH-ENDER!) LOTN APPLY TO ULCER AREA 3 TIMES A DAY AS DIRECTED. Qty: 120 mL, Refills: 5    loratadine (CLARITIN) 10 MG tablet Take 1 tab BID x 5 days then daily prn thereafter Qty: 10 tablet, Refills: 0   Associated Diagnoses: Allergic rhinitis, unspecified allergic rhinitis type    SILVADENE 1 % cream APPLY EVERY DAY TO ULCER UNTIL HEALED Qty: 400 g, Refills: 0      STOP taking these  medications     omeprazole (PRILOSEC) 20 MG capsule          DISCHARGE INSTRUCTIONS:    Patient is to follow-up with primary care physician and the orthopedist surgeon in 1-2 weeks after discharge. Patient is to have his Foley catheter withdrawn in about 1 week for voiding trial  If you experience worsening of your admission symptoms, develop shortness of breath, life threatening emergency, suicidal or homicidal thoughts you must seek medical attention immediately by calling 911 or calling your MD immediately  if symptoms less severe.  You Must read complete instructions/literature along with all the possible adverse reactions/side effects for all the Medicines you take and that have been prescribed to you. Take any new Medicines after you have completely understood and accept all the possible adverse reactions/side effects.   Please note  You were cared for by a hospitalist during your hospital stay. If you have any questions about your discharge medications or the care you received while you were in the hospital after you are discharged, you can call the unit and asked to speak with the hospitalist on call if the hospitalist that took care of you is not available. Once you are discharged, your primary care physician will handle any further medical issues. Please note that NO REFILLS for any discharge medications will be authorized once you are discharged, as it is imperative that you return to your primary care physician (or establish a relationship with a primary care physician if you do not have one) for your aftercare needs so that they can reassess your need for medications and monitor your lab values.    Today   CHIEF COMPLAINT:   Chief Complaint  Patient presents with  . Fall    HISTORY OF PRESENT ILLNESS:  Jorey Dollard  is a 81 y.o. male with a known history of ulcerative colitis, coronary artery disease, esophageal stricture, dementia who presents to the hospital after fall  in nursing facility. On arrival to emergency room, patient was noted to have acute right femoral neck fracture. Patient was taken to operating room on 09/05/2014 by Dr. Roland Rack and had right hip unipolar hemiarthroplasty done. Perioperatively  patient was noted to have high fevers as high as 101, he had a chest x-ray repeated and was found to have patchy lower lobe opacity on lateral view which may represent lower lobe pneumonia per radiologist. Patient was initiated on broad-spectrum antibiotic therapy, initially vancomycin, as well as Zosyn and her blood cultures were taken. Blood cultures showed no growth in 2 days. Patient antibiotic is being changed to levofloxacin to continue to complete course. Speech therapist is to evaluate  patient today to rule out aspiration. Postoperatively, patient was also noted to have acute urinary retention requiring Foley catheter placement. Catheter was withdrawn later on, however, patient still had difficulty voiding so catheter was replaced. Patient was initiated on Flomax and finasteride and his Foley catheter should be withdrawn and approximately 1 week for voiding evaluation.  Discussion by problem 89y/oM with PMH of CAD s/p CABG, CKD stage3, Dementia, Anxiety from [facility], admitted after a fall and right femoral head fracture.  #1 right femoral fracture- s/p right hip hemiarthroplasty, POD #3 today -Orthopedic consult is appreciated, pain management is satisfactory. -Postoperative DVT prophylaxis with Lovenox subcutaneously for 2 week therapy and physical therapy in skilled nursing facility. -Discharged to rehabilitation today as medically stable  #2 coronary artery disease-status post CABG-no angina symptoms. -Continue cardiac medications - Restarted aspirin  #3 acute urinary retention-appreciate urology input in placing the Foley catheter, Foley catheter was withdrawn but then replaced again. -Flomax and finasteride to be continued per  urology recommendation. -Discontinue Foley catheter in about 1 week and reevaluate  #4 Fevers-  - blood cultures and urine cultures are negative so far. - UA not very impressive for infection -Discontinue vancomycin and Zosyn, start patient on levofloxacin to be continued for 7 days - Chest x-ray showing possible lower lobe infiltrate. Likely source of his fevers. Patient will be seen by a speech therapist to evaluate his swallowing and rule out dysphagia-induced aspiration pneumonitis -Continue to monitor while on antibiotics. Incentive spirometry advised. - fevers started prior to the surgery  #5 history of ulcerative colitis-stable on mesalamine  #6 GERD-continue Protonix.  #7 history of dementia-continue home medications. Currently, calm and cooperative. No signs for  postop delirium.  #8 DVT Prophylaxis- On lovenox      VITAL SIGNS:  Blood pressure 145/65, pulse 76, temperature 97.8 F (36.6 C), temperature source Oral, resp. rate 17, height 6' (1.829 m), weight 62.415 kg (137 lb 9.6 oz), SpO2 97 %.  I/O:   Intake/Output Summary (Last 24 hours) at 09/08/14 0937 Last data filed at 09/08/14 0900  Gross per 24 hour  Intake    730 ml  Output    850 ml  Net   -120 ml    PHYSICAL EXAMINATION:  GENERAL:  81 y.o.-year-old patient lying in the bed with no acute distress.  EYES: Pupils equal, round, reactive to light and accommodation. No scleral icterus. Extraocular muscles intact.  HEENT: Head atraumatic, normocephalic. Oropharynx and nasopharynx clear.  NECK:  Supple, no jugular venous distention. No thyroid enlargement, no tenderness.  LUNGS: Normal breath sounds bilaterally, no wheezing, rales,rhonchi or crepitation. No use of accessory muscles of respiration.  CARDIOVASCULAR: S1, S2 normal. No murmurs, rubs, or gallops.  ABDOMEN: Soft, non-tender, non-distended. Bowel sounds present. No organomegaly or mass.  EXTREMITIES: No pedal edema, cyanosis, or clubbing.   NEUROLOGIC: Cranial nerves II through XII are intact. Muscle strength 5/5 in all extremities. Sensation intact. Gait not checked.  PSYCHIATRIC: The patient is alert and oriented x 3.  SKIN: No obvious rash, lesion, or ulcer.   DATA REVIEW:   CBC  Recent Labs Lab 09/07/14 0539  WBC 10.1  HGB 11.5*  HCT 35.1*  PLT 87*    Chemistries   Recent Labs Lab 09/08/14 0526  NA 140  K 3.4*  CL 102  CO2 31  GLUCOSE 95  BUN 36*  CREATININE 1.21  CALCIUM 8.7*    Cardiac Enzymes No results for input(s): TROPONINI in the last 168 hours.  Microbiology Results  Results for orders placed or performed during the hospital encounter of 09/04/14  Surgical pcr screen     Status: None   Collection Time: 09/05/14  1:30 AM  Result Value Ref Range Status   MRSA, PCR NEGATIVE NEGATIVE Final   Staphylococcus aureus NEGATIVE NEGATIVE Final    Comment:        The Xpert SA Assay (FDA approved for NASAL specimens in patients over 43 years of age), is one component of a comprehensive surveillance program.  Test performance has been validated by [facility] for patients greater than or equal to 69 year old. It is not intended to diagnose infection nor to guide or monitor treatment.   Culture, blood (routine x 2)     Status: None (Preliminary result)   Collection Time: 09/06/14  9:25 AM  Result Value Ref Range Status   Specimen Description BLOOD LEFT ASSIST CONTROL  Final   Special Requests   Final    BOTTLES DRAWN AEROBIC AND ANAEROBIC  5CC AERO 10 CC ANAERO   Culture NO GROWTH 2 DAYS  Final   Report Status PENDING  Incomplete  Culture, blood (routine x 2)     Status: None (Preliminary result)   Collection Time: 09/06/14  9:34 AM  Result Value Ref Range Status   Specimen Description BLOOD LEFT WRIST  Final   Special Requests BOTTLES DRAWN AEROBIC AND ANAEROBIC  7 CC  Final   Culture NO GROWTH 2 DAYS  Final   Report Status PENDING  Incomplete  Urine culture     Status: None    Collection Time: 09/06/14 10:17 AM  Result Value Ref Range Status   Specimen Description URINE, RANDOM  Final   Special Requests NONE  Final   Culture NO GROWTH 2 DAYS  Final   Report Status 09/08/2014 FINAL  Final    RADIOLOGY:  Dg Chest 2 View  09/07/2014   CLINICAL DATA:  Fever status post recent right hip hemiarthroplasty.  EXAM: CHEST  2 VIEW  COMPARISON:  09/04/2014 chest radiograph  FINDINGS: Median sternotomy wires are aligned and intact. CABG clips are noted throughout the mediastinum. Stable cardiomediastinal silhouette with mild cardiomegaly and mildly tortuous atherosclerotic thoracic aorta. No pneumothorax. There is stable pleural thickening at the lateral basilar right pleural space, which is a stable chronic finding back to the 2013. No evidence of an acute pleural effusion. Stable calcified granuloma in the left mid lung. Stable pleural-parenchymal scarring at the right lung apex. There is patchy lower lung opacity overlying the lower thoracic spine on the lateral radiograph. No pulmonary edema. Moderate degenerative changes are present in the thoracic spine.  IMPRESSION: 1. Patchy lower lobe opacity on the lateral view, which may represent a lower lobe pneumonia. Advise posttreatment follow-up PA and lateral chest radiographs to document resolution. 2. Stable chronic lateral basilar right pleural thickening.   Electronically Signed   By: Ilona Sorrel M.D.   On: 09/07/2014 10:03    EKG:   Orders placed or performed during the hospital encounter of 09/04/14  . ED EKG  . ED EKG  . EKG 12-Lead  . EKG 12-Lead      Management plans discussed with the patient, family and they are in agreement.  CODE STATUS:     Code Status Orders        Start     Ordered   09/05/14 1848  Do not attempt resuscitation (DNR)   Continuous    Question Answer Comment  In the event of cardiac or respiratory ARREST Do not call a "code blue"   In the event of cardiac or respiratory ARREST Do not  perform Intubation, CPR, defibrillation or ACLS   In the event of cardiac or respiratory ARREST Use medication by any route, position, wound care, and other measures to relive pain and suffering. May use oxygen, suction and manual treatment of airway obstruction as needed for comfort.      09/05/14 1847    Advance Directive Documentation        Most Recent Value   Type of Advance Directive  Out of facility DNR (pink MOST or yellow form)   Pre-existing out of facility DNR order (yellow form or pink MOST form)  Physician notified to receive inpatient order, Yellow form placed in chart (order not valid for inpatient use)   "MOST" Form in Place?        TOTAL TIME TAKING CARE OF THIS PATIENT: 45 minutes.  Discussed this with Dr. Lady Deutscher M.D on 09/08/2014 at 9:37 AM  Between 7am to 6pm - Pager - [phone]  After 6pm go to [email or web address] - password [email or web address] [facility]  Office  [phone]  CC: Primary care physician; Viviana Simpler, MD

## 2014-09-11 LAB — CULTURE, BLOOD (ROUTINE X 2)
Culture: NO GROWTH
Culture: NO GROWTH

## 2014-09-12 ENCOUNTER — Encounter: Payer: Self-pay | Admitting: Surgery

## 2014-09-12 ENCOUNTER — Telehealth: Payer: Self-pay

## 2014-09-12 NOTE — Telephone Encounter (Signed)
Will check on him this afternoon

## 2014-09-12 NOTE — Telephone Encounter (Signed)
PLEASE NOTE: All timestamps contained within this report are represented as [place] Standard Time. CONFIDENTIALTY NOTICE: This fax transmission is intended only for the addressee. It contains information that is legally privileged, confidential or otherwise protected from use or disclosure. If you are not the intended recipient, you are strictly prohibited from reviewing, disclosing, copying using or disseminating any of this information or taking any action in reliance on or regarding this information. If you have received this fax in error, please notify us immediately by telephone so that we can arrange for its return to [place]. Phone: [phone], Toll-Free: [phone], Fax: [phone] Page: 1 of 1 Call Id: [id number] [place] Patient Name: Dustin Byrd Gender: Male DOB: 1918-02-16 Age: 81 Y 2 M 6 D Return Phone Number: Address: City/State/Zip: [place] Client [address] Night - Client Client Site [place] Physician Viviana Simpler Contact Type Call Call Type Page Only Caller Name Melva Relationship To Patient Provider Is this call to report lab results? No Return Phone Number Please choose phone number Initial Comment Caller states that is from [facility]. Patient needs an order to change dressing and count staples. CB # [id number] Nurse Assessment Guidelines Guideline Title Affirmed Question Affirmed Notes Nurse Date/Time (Eastern Time) Disp. Time Eilene Ghazi Time) Disposition Final User 09/08/2014 10:40:13 PM Send to [place] 09/08/2014 10:45:18 PM Paged On Call to Other Provider Stephens November 09/08/2014 10:45:45 PM Page Completed Yes Stephens November After Care Instructions Given Call Event Type User Date / Time Description Paging DoctorName Phone DateTime Result/Outcome Message Type Notes Penni Homans  [phone] 09/08/2014 10:45:18 PM Paged On Call to Other Provider Doctor Paged Please call Melva at [phone] Penni Homans 09/08/2014 10:45:36 PM Paged On Call to Another Provider Message Result

## 2014-09-13 DIAGNOSIS — S7291XA Unspecified fracture of right femur, initial encounter for closed fracture: Secondary | ICD-10-CM

## 2014-09-13 DIAGNOSIS — J189 Pneumonia, unspecified organism: Secondary | ICD-10-CM

## 2014-09-13 DIAGNOSIS — R339 Retention of urine, unspecified: Secondary | ICD-10-CM

## 2014-09-20 DIAGNOSIS — Z967 Presence of other bone and tendon implants: Secondary | ICD-10-CM | POA: Diagnosis not present

## 2014-09-20 DIAGNOSIS — Z8781 Personal history of (healed) traumatic fracture: Secondary | ICD-10-CM | POA: Diagnosis not present

## 2014-10-06 DIAGNOSIS — R233 Spontaneous ecchymoses: Secondary | ICD-10-CM | POA: Diagnosis not present

## 2014-10-06 DIAGNOSIS — M25551 Pain in right hip: Secondary | ICD-10-CM | POA: Diagnosis not present

## 2014-10-09 DIAGNOSIS — J4520 Mild intermittent asthma, uncomplicated: Secondary | ICD-10-CM | POA: Diagnosis not present

## 2014-10-09 DIAGNOSIS — F0150 Vascular dementia without behavioral disturbance: Secondary | ICD-10-CM

## 2014-10-09 DIAGNOSIS — D692 Other nonthrombocytopenic purpura: Secondary | ICD-10-CM | POA: Diagnosis not present

## 2014-10-09 DIAGNOSIS — F39 Unspecified mood [affective] disorder: Secondary | ICD-10-CM

## 2014-10-09 DIAGNOSIS — N400 Enlarged prostate without lower urinary tract symptoms: Secondary | ICD-10-CM | POA: Diagnosis not present

## 2014-10-09 DIAGNOSIS — K219 Gastro-esophageal reflux disease without esophagitis: Secondary | ICD-10-CM | POA: Diagnosis not present

## 2014-10-09 DIAGNOSIS — R251 Tremor, unspecified: Secondary | ICD-10-CM

## 2014-10-09 DIAGNOSIS — Z23 Encounter for immunization: Secondary | ICD-10-CM | POA: Diagnosis not present

## 2014-11-29 DIAGNOSIS — S60512A Abrasion of left hand, initial encounter: Secondary | ICD-10-CM | POA: Diagnosis not present

## 2014-11-29 DIAGNOSIS — S60222A Contusion of left hand, initial encounter: Secondary | ICD-10-CM | POA: Diagnosis not present

## 2014-12-01 DIAGNOSIS — R319 Hematuria, unspecified: Secondary | ICD-10-CM | POA: Diagnosis not present

## 2014-12-13 DIAGNOSIS — K219 Gastro-esophageal reflux disease without esophagitis: Secondary | ICD-10-CM | POA: Diagnosis not present

## 2014-12-13 DIAGNOSIS — F0150 Vascular dementia without behavioral disturbance: Secondary | ICD-10-CM

## 2014-12-13 DIAGNOSIS — I951 Orthostatic hypotension: Secondary | ICD-10-CM

## 2014-12-13 DIAGNOSIS — K5120 Ulcerative (chronic) proctitis without complications: Secondary | ICD-10-CM | POA: Diagnosis not present

## 2014-12-13 DIAGNOSIS — N401 Enlarged prostate with lower urinary tract symptoms: Secondary | ICD-10-CM | POA: Diagnosis not present

## 2014-12-13 DIAGNOSIS — M1000 Idiopathic gout, unspecified site: Secondary | ICD-10-CM | POA: Diagnosis not present

## 2015-01-02 DIAGNOSIS — L97529 Non-pressure chronic ulcer of other part of left foot with unspecified severity: Secondary | ICD-10-CM | POA: Diagnosis not present

## 2015-02-08 DIAGNOSIS — I951 Orthostatic hypotension: Secondary | ICD-10-CM | POA: Diagnosis not present

## 2015-02-08 DIAGNOSIS — F0150 Vascular dementia without behavioral disturbance: Secondary | ICD-10-CM | POA: Diagnosis not present

## 2015-02-08 DIAGNOSIS — K5190 Ulcerative colitis, unspecified, without complications: Secondary | ICD-10-CM

## 2015-02-08 DIAGNOSIS — F39 Unspecified mood [affective] disorder: Secondary | ICD-10-CM

## 2015-02-27 DIAGNOSIS — R0789 Other chest pain: Secondary | ICD-10-CM | POA: Diagnosis not present

## 2015-02-28 DIAGNOSIS — R05 Cough: Secondary | ICD-10-CM | POA: Diagnosis not present

## 2015-03-21 ENCOUNTER — Telehealth: Payer: Self-pay | Admitting: Internal Medicine

## 2015-03-21 NOTE — Telephone Encounter (Signed)
LM for pt to schedule AWV, mn

## 2015-03-30 NOTE — Telephone Encounter (Signed)
Patient is seen at [facility].  I let daughter know he doesn't need an appointment.

## 2015-04-18 DIAGNOSIS — F0150 Vascular dementia without behavioral disturbance: Secondary | ICD-10-CM

## 2015-04-18 DIAGNOSIS — K5190 Ulcerative colitis, unspecified, without complications: Secondary | ICD-10-CM | POA: Diagnosis not present

## 2015-04-18 DIAGNOSIS — M1000 Idiopathic gout, unspecified site: Secondary | ICD-10-CM

## 2015-04-18 DIAGNOSIS — I951 Orthostatic hypotension: Secondary | ICD-10-CM | POA: Diagnosis not present

## 2015-04-18 DIAGNOSIS — F39 Unspecified mood [affective] disorder: Secondary | ICD-10-CM

## 2015-04-18 DIAGNOSIS — N401 Enlarged prostate with lower urinary tract symptoms: Secondary | ICD-10-CM | POA: Diagnosis not present

## 2015-04-18 DIAGNOSIS — K219 Gastro-esophageal reflux disease without esophagitis: Secondary | ICD-10-CM | POA: Diagnosis not present

## 2015-04-23 DIAGNOSIS — N390 Urinary tract infection, site not specified: Secondary | ICD-10-CM | POA: Diagnosis not present

## 2015-04-30 ENCOUNTER — Telehealth: Payer: Self-pay | Admitting: *Deleted

## 2015-04-30 DIAGNOSIS — R451 Restlessness and agitation: Secondary | ICD-10-CM | POA: Diagnosis not present

## 2015-04-30 NOTE — Telephone Encounter (Signed)
PLEASE NOTE: All timestamps contained within this report are represented as [place] Standard Time. CONFIDENTIALTY NOTICE: This fax transmission is intended only for the addressee. It contains information that is legally privileged, confidential or otherwise protected from use or disclosure. If you are not the intended recipient, you are strictly prohibited from reviewing, disclosing, copying using or disseminating any of this information or taking any action in reliance on or regarding this information. If you have received this fax in error, please notify us immediately by telephone so that we can arrange for its return to [place]. Phone: [phone], Toll-Free: [phone], Fax: [phone] Page: 1 of 1 Call Id: [id number] [place] Night - Client Nonclinical Telephone Record [place] Night - Client Client Site [place] Physician Viviana Simpler Contact Type Call Who Is Calling Physician / Provider / Hospital Call Type Provider Call [facility] Page Now Reason for Call Request to speak to Physician Initial Comment Caller states is [facility], needing to speak to dr on call. Additional Comment Pt possibly has UTI. Patient Name Dustin Byrd Patient DOB Mar 22, 1918 Requesting Provider [facility] Physician Number [place] Name [facility] Phone DateTime Result/Outcome Message Type Notes Eliezer Lofts [id number] 04/28/2015 11:01:13 AM Paged On Call to Other Provider Doctor Paged [facility]: Please call Colletta [place] at [phone] regarding [place]. Eliezer Lofts 04/28/2015 11:01:20 AM Paged On Call to Another Provider Message Result Call Closed By: Dalia Heading Transaction Date/Time: 04/28/2015 10:56:31 AM (ET)

## 2015-04-30 NOTE — Telephone Encounter (Signed)
Seen today and discussed with daughter

## 2015-05-14 DIAGNOSIS — E785 Hyperlipidemia, unspecified: Secondary | ICD-10-CM | POA: Diagnosis not present

## 2015-05-14 DIAGNOSIS — K219 Gastro-esophageal reflux disease without esophagitis: Secondary | ICD-10-CM | POA: Diagnosis not present

## 2015-06-07 DIAGNOSIS — K5190 Ulcerative colitis, unspecified, without complications: Secondary | ICD-10-CM | POA: Diagnosis not present

## 2015-06-07 DIAGNOSIS — I951 Orthostatic hypotension: Secondary | ICD-10-CM | POA: Diagnosis not present

## 2015-06-07 DIAGNOSIS — F22 Delusional disorders: Secondary | ICD-10-CM | POA: Diagnosis not present

## 2015-06-07 DIAGNOSIS — F0151 Vascular dementia with behavioral disturbance: Secondary | ICD-10-CM | POA: Diagnosis not present

## 2015-06-11 DIAGNOSIS — S90811A Abrasion, right foot, initial encounter: Secondary | ICD-10-CM | POA: Diagnosis not present

## 2015-06-14 DIAGNOSIS — M6281 Muscle weakness (generalized): Secondary | ICD-10-CM | POA: Diagnosis not present

## 2015-06-14 DIAGNOSIS — R4189 Other symptoms and signs involving cognitive functions and awareness: Secondary | ICD-10-CM | POA: Diagnosis not present

## 2015-06-14 DIAGNOSIS — R2681 Unsteadiness on feet: Secondary | ICD-10-CM | POA: Diagnosis not present

## 2015-06-18 DIAGNOSIS — R4189 Other symptoms and signs involving cognitive functions and awareness: Secondary | ICD-10-CM | POA: Diagnosis not present

## 2015-06-18 DIAGNOSIS — M6281 Muscle weakness (generalized): Secondary | ICD-10-CM | POA: Diagnosis not present

## 2015-06-18 DIAGNOSIS — R2681 Unsteadiness on feet: Secondary | ICD-10-CM | POA: Diagnosis not present

## 2015-06-19 DIAGNOSIS — M6281 Muscle weakness (generalized): Secondary | ICD-10-CM | POA: Diagnosis not present

## 2015-06-19 DIAGNOSIS — R4189 Other symptoms and signs involving cognitive functions and awareness: Secondary | ICD-10-CM | POA: Diagnosis not present

## 2015-06-19 DIAGNOSIS — R2681 Unsteadiness on feet: Secondary | ICD-10-CM | POA: Diagnosis not present

## 2015-06-20 DIAGNOSIS — M6281 Muscle weakness (generalized): Secondary | ICD-10-CM | POA: Diagnosis not present

## 2015-06-20 DIAGNOSIS — R4189 Other symptoms and signs involving cognitive functions and awareness: Secondary | ICD-10-CM | POA: Diagnosis not present

## 2015-06-20 DIAGNOSIS — R2681 Unsteadiness on feet: Secondary | ICD-10-CM | POA: Diagnosis not present

## 2015-06-21 DIAGNOSIS — M6281 Muscle weakness (generalized): Secondary | ICD-10-CM | POA: Diagnosis not present

## 2015-06-21 DIAGNOSIS — R2681 Unsteadiness on feet: Secondary | ICD-10-CM | POA: Diagnosis not present

## 2015-06-21 DIAGNOSIS — R4189 Other symptoms and signs involving cognitive functions and awareness: Secondary | ICD-10-CM | POA: Diagnosis not present

## 2015-06-22 DIAGNOSIS — R2681 Unsteadiness on feet: Secondary | ICD-10-CM | POA: Diagnosis not present

## 2015-06-22 DIAGNOSIS — M6281 Muscle weakness (generalized): Secondary | ICD-10-CM | POA: Diagnosis not present

## 2015-06-22 DIAGNOSIS — R4189 Other symptoms and signs involving cognitive functions and awareness: Secondary | ICD-10-CM | POA: Diagnosis not present

## 2015-06-25 DIAGNOSIS — R2681 Unsteadiness on feet: Secondary | ICD-10-CM | POA: Diagnosis not present

## 2015-06-25 DIAGNOSIS — R4189 Other symptoms and signs involving cognitive functions and awareness: Secondary | ICD-10-CM | POA: Diagnosis not present

## 2015-06-25 DIAGNOSIS — M6281 Muscle weakness (generalized): Secondary | ICD-10-CM | POA: Diagnosis not present

## 2015-06-26 DIAGNOSIS — R4189 Other symptoms and signs involving cognitive functions and awareness: Secondary | ICD-10-CM | POA: Diagnosis not present

## 2015-06-26 DIAGNOSIS — M6281 Muscle weakness (generalized): Secondary | ICD-10-CM | POA: Diagnosis not present

## 2015-06-26 DIAGNOSIS — R2681 Unsteadiness on feet: Secondary | ICD-10-CM | POA: Diagnosis not present

## 2015-06-27 DIAGNOSIS — M6281 Muscle weakness (generalized): Secondary | ICD-10-CM | POA: Diagnosis not present

## 2015-06-27 DIAGNOSIS — R2681 Unsteadiness on feet: Secondary | ICD-10-CM | POA: Diagnosis not present

## 2015-06-27 DIAGNOSIS — R4189 Other symptoms and signs involving cognitive functions and awareness: Secondary | ICD-10-CM | POA: Diagnosis not present

## 2015-06-28 DIAGNOSIS — R4189 Other symptoms and signs involving cognitive functions and awareness: Secondary | ICD-10-CM | POA: Diagnosis not present

## 2015-06-28 DIAGNOSIS — R2681 Unsteadiness on feet: Secondary | ICD-10-CM | POA: Diagnosis not present

## 2015-06-28 DIAGNOSIS — M6281 Muscle weakness (generalized): Secondary | ICD-10-CM | POA: Diagnosis not present

## 2015-06-29 DIAGNOSIS — M6281 Muscle weakness (generalized): Secondary | ICD-10-CM | POA: Diagnosis not present

## 2015-06-29 DIAGNOSIS — R4189 Other symptoms and signs involving cognitive functions and awareness: Secondary | ICD-10-CM | POA: Diagnosis not present

## 2015-06-29 DIAGNOSIS — R2681 Unsteadiness on feet: Secondary | ICD-10-CM | POA: Diagnosis not present

## 2015-07-02 DIAGNOSIS — M6281 Muscle weakness (generalized): Secondary | ICD-10-CM | POA: Diagnosis not present

## 2015-07-02 DIAGNOSIS — R4189 Other symptoms and signs involving cognitive functions and awareness: Secondary | ICD-10-CM | POA: Diagnosis not present

## 2015-07-02 DIAGNOSIS — R2681 Unsteadiness on feet: Secondary | ICD-10-CM | POA: Diagnosis not present

## 2015-07-03 DIAGNOSIS — R2681 Unsteadiness on feet: Secondary | ICD-10-CM | POA: Diagnosis not present

## 2015-07-03 DIAGNOSIS — M6281 Muscle weakness (generalized): Secondary | ICD-10-CM | POA: Diagnosis not present

## 2015-07-03 DIAGNOSIS — R4189 Other symptoms and signs involving cognitive functions and awareness: Secondary | ICD-10-CM | POA: Diagnosis not present

## 2015-07-04 DIAGNOSIS — R4189 Other symptoms and signs involving cognitive functions and awareness: Secondary | ICD-10-CM | POA: Diagnosis not present

## 2015-07-04 DIAGNOSIS — R2681 Unsteadiness on feet: Secondary | ICD-10-CM | POA: Diagnosis not present

## 2015-07-04 DIAGNOSIS — M6281 Muscle weakness (generalized): Secondary | ICD-10-CM | POA: Diagnosis not present

## 2015-07-05 DIAGNOSIS — M6281 Muscle weakness (generalized): Secondary | ICD-10-CM | POA: Diagnosis not present

## 2015-07-05 DIAGNOSIS — R2681 Unsteadiness on feet: Secondary | ICD-10-CM | POA: Diagnosis not present

## 2015-07-05 DIAGNOSIS — R4189 Other symptoms and signs involving cognitive functions and awareness: Secondary | ICD-10-CM | POA: Diagnosis not present

## 2015-07-06 DIAGNOSIS — M6281 Muscle weakness (generalized): Secondary | ICD-10-CM | POA: Diagnosis not present

## 2015-07-06 DIAGNOSIS — R2681 Unsteadiness on feet: Secondary | ICD-10-CM | POA: Diagnosis not present

## 2015-07-06 DIAGNOSIS — R4189 Other symptoms and signs involving cognitive functions and awareness: Secondary | ICD-10-CM | POA: Diagnosis not present

## 2015-07-09 DIAGNOSIS — M6281 Muscle weakness (generalized): Secondary | ICD-10-CM | POA: Diagnosis not present

## 2015-07-09 DIAGNOSIS — R2681 Unsteadiness on feet: Secondary | ICD-10-CM | POA: Diagnosis not present

## 2015-07-18 DIAGNOSIS — R609 Edema, unspecified: Secondary | ICD-10-CM | POA: Diagnosis not present

## 2015-07-18 DIAGNOSIS — S81802A Unspecified open wound, left lower leg, initial encounter: Secondary | ICD-10-CM | POA: Diagnosis not present

## 2015-07-30 DIAGNOSIS — S81001A Unspecified open wound, right knee, initial encounter: Secondary | ICD-10-CM | POA: Diagnosis not present

## 2015-08-06 IMAGING — CT CT HEAD WITHOUT CONTRAST
1 series · 15 of 30 positions shown, 19 images · non-contrast
Comparison: CT of the head May 22, 2009

ADDENDUM:
Acute findings discussed with and reconfirmed by Dr.SHUEL MAYABINI on
12/28/2013 at [DATE].

  By: [REDACTED]AL DATA:  [HOSPITAL] patient, RIGHT-sided weakness and
facial droop beginning at 2989 hr.
EXAM:
CT HEAD WITHOUT CONTRAST
TECHNIQUE: Contiguous axial images were obtained from the base of the skull
through the vertex without intravenous contrast.

[Series 2: head wo · axial · 0.43mm/px · z∈[-92,+34]mm · 15 of 32 slices shown, 19 images]
[im 2/32  brain]
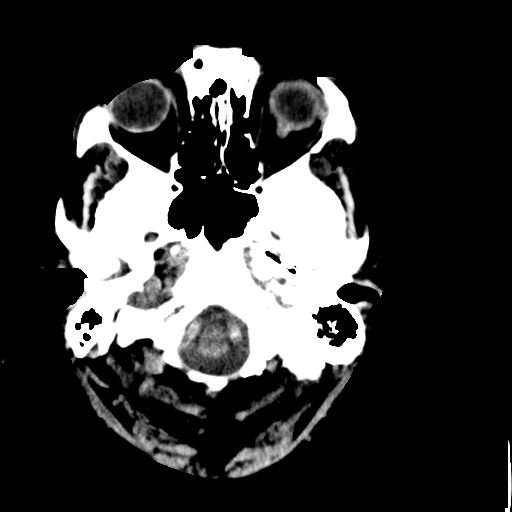
[im 2/32  bone]
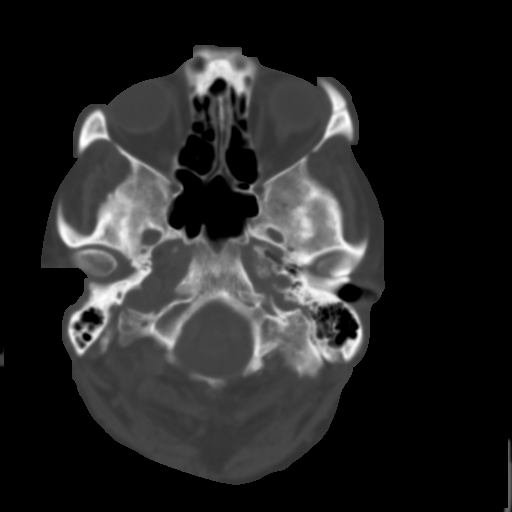
[im 4/32  brain]
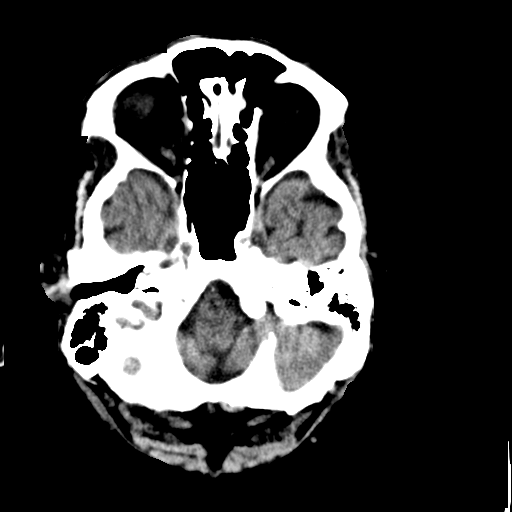
[im 6/32  brain]
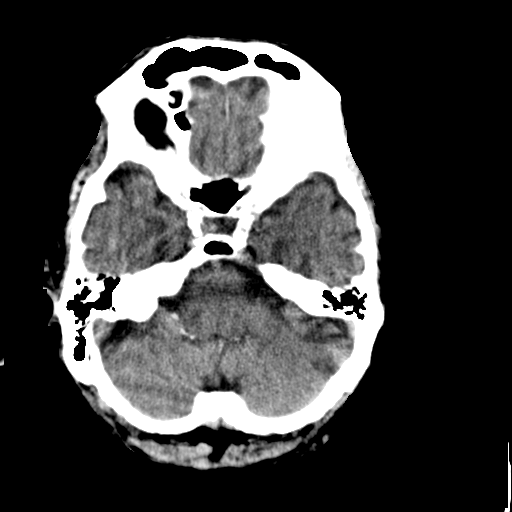
[im 8/32  brain]
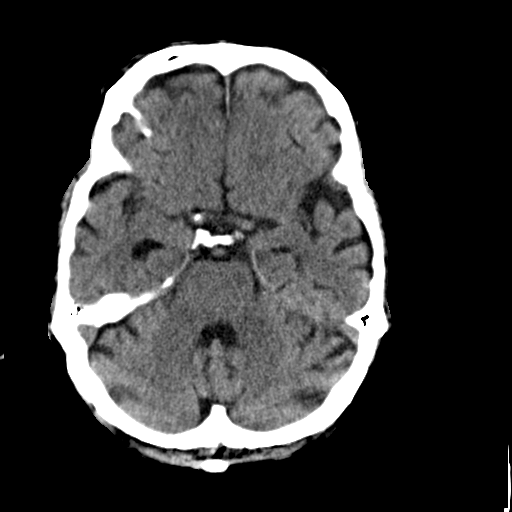
[im 10/32  brain]
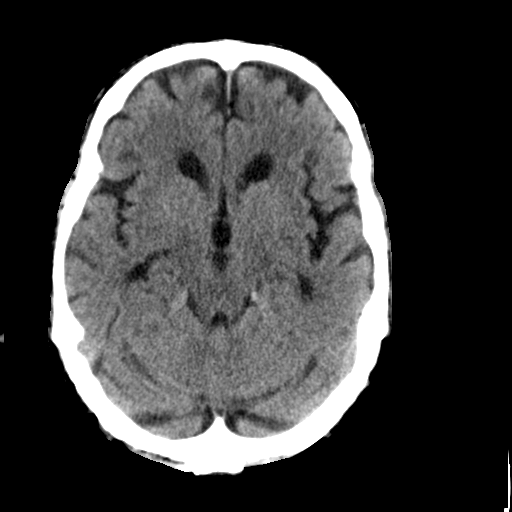
[im 10/32  bone]
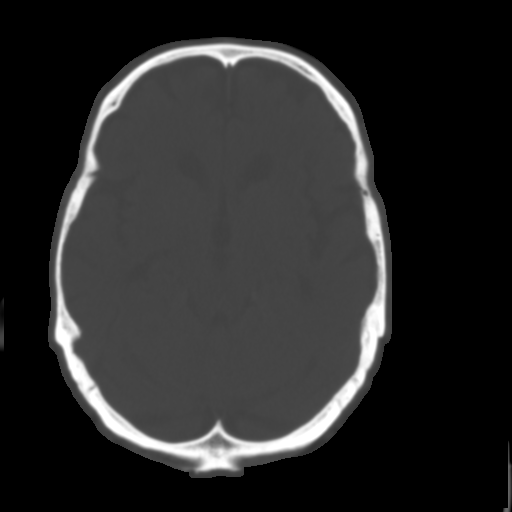
[im 12/32  brain]
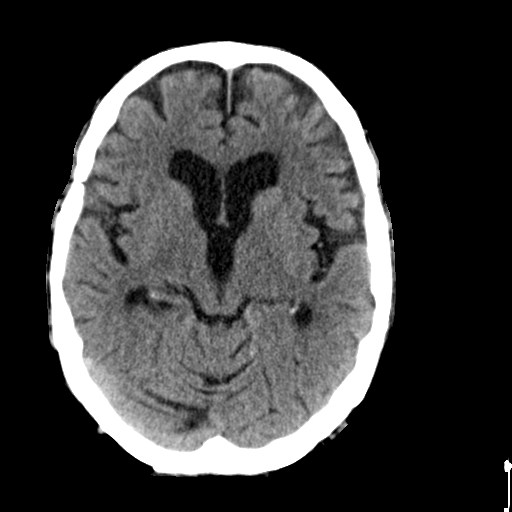
[im 14/32  brain]
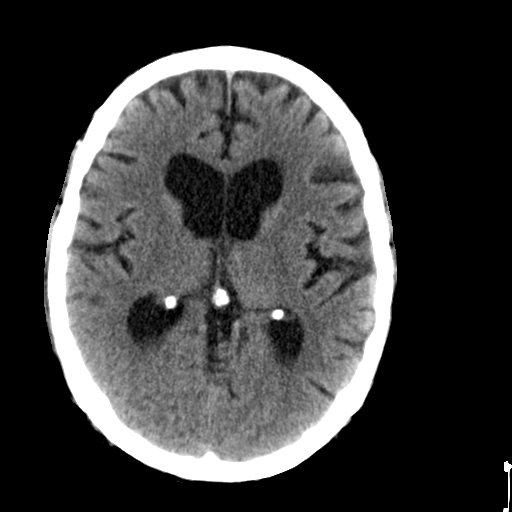
[im 17/32  brain]
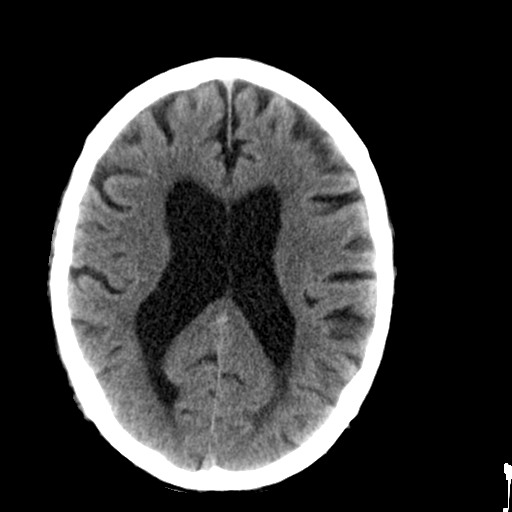
[im 18/32  brain]
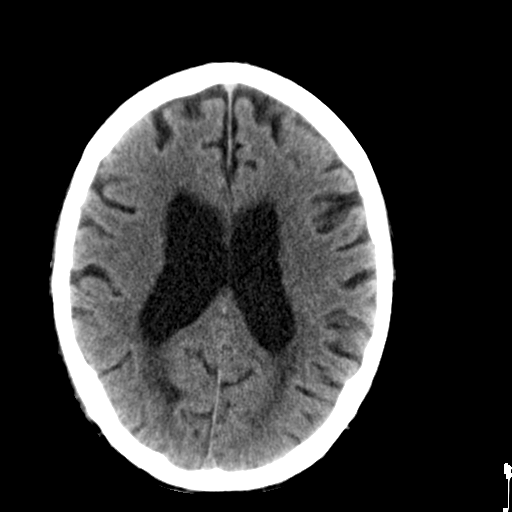
[im 18/32  bone]
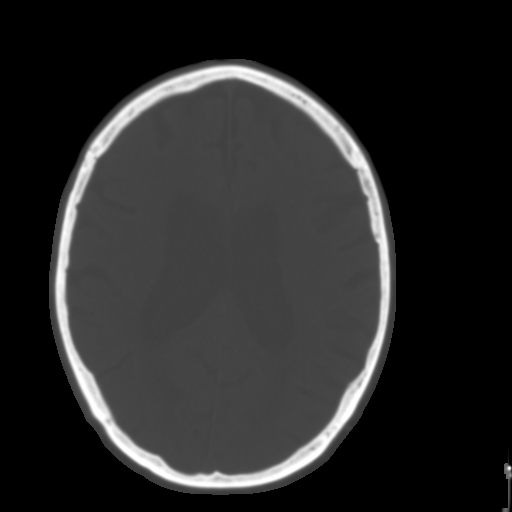
[im 20/32  brain]
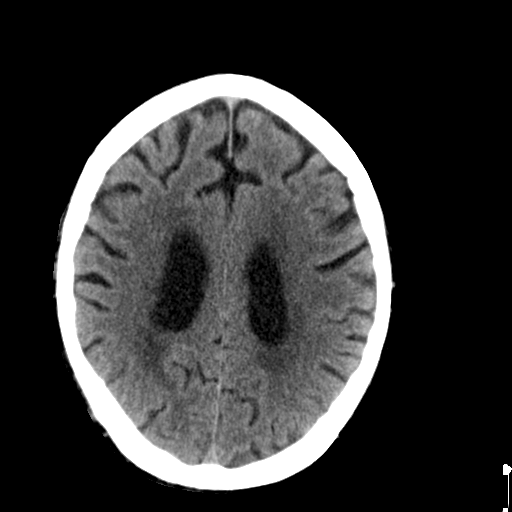
[im 22/32  brain]
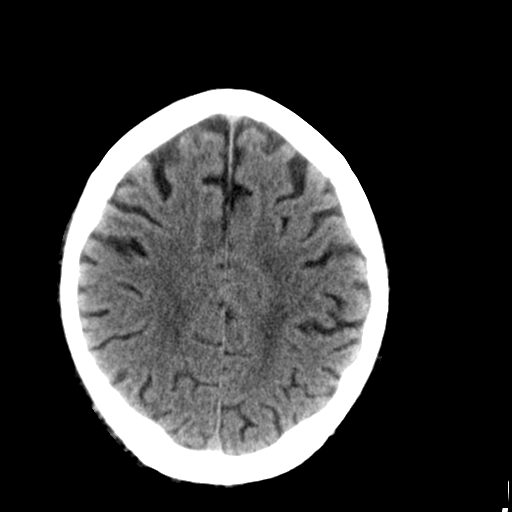
[im 24/32  brain]
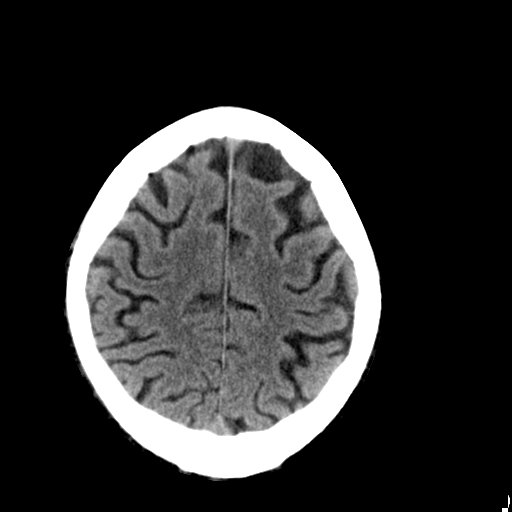
[im 26/32  brain]
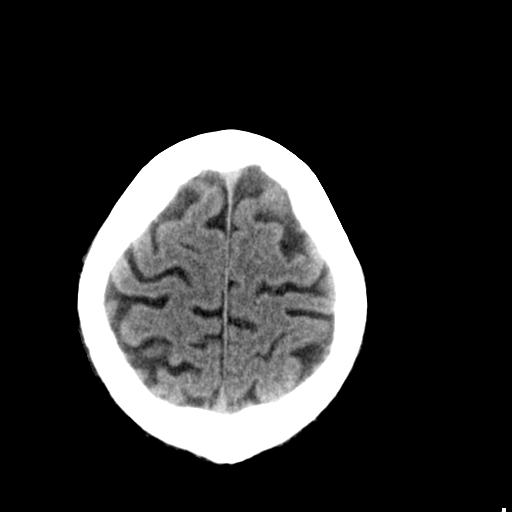
[im 26/32  bone]
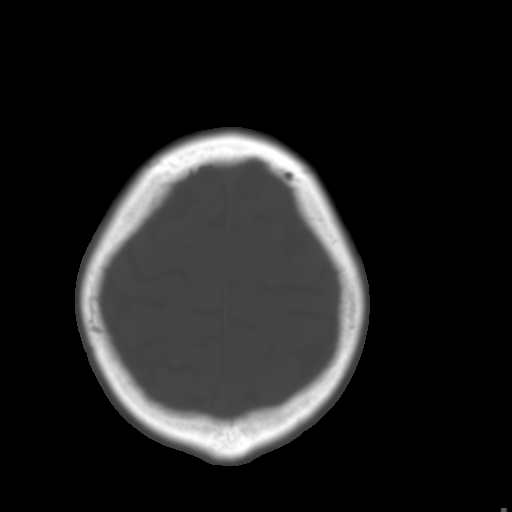
[im 28/32  brain]
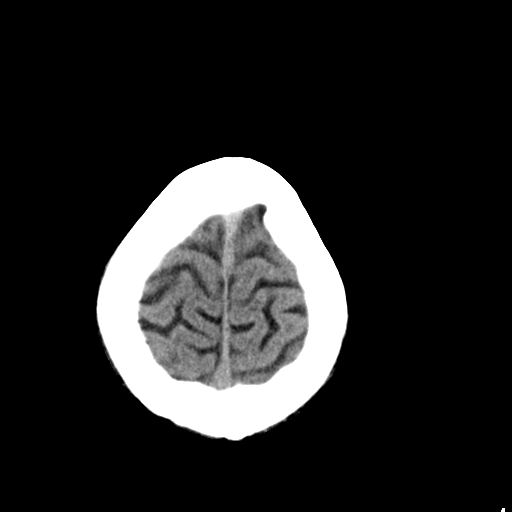
[im 30/32  brain]
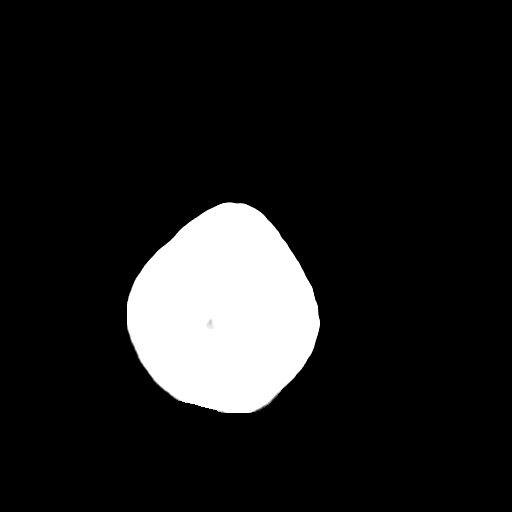

[15 of 30 positions shown; findings below may reference images not displayed]

FINDINGS: The ventricles and sulci are normal for age. No intraparenchymal
hemorrhage, mass effect nor midline shift. Patchy supratentorial
white matter hypodensities are less than expected for patient's age
and though non-specific suggest sequelae of chronic small vessel
ischemic disease. No acute large vascular territory infarcts.

No abnormal extra-axial fluid collections. Basal cisterns are
patent. Moderate calcific atherosclerosis of the carotid siphons.

No skull fracture. The included ocular globes and orbital contents
are non-suspicious. The mastoid aircells and included paranasal
sinuses are well-aerated.
IMPRESSION: No acute intracranial process, specifically no acute large vascular
territory infarct.

Involutional changes. Mild to moderate matter changes suggest
chronic small vessel ischemic disease.

By: Klpigbb Moolman

## 2015-08-08 DIAGNOSIS — K219 Gastro-esophageal reflux disease without esophagitis: Secondary | ICD-10-CM | POA: Diagnosis not present

## 2015-08-08 DIAGNOSIS — F0150 Vascular dementia without behavioral disturbance: Secondary | ICD-10-CM

## 2015-08-08 DIAGNOSIS — N401 Enlarged prostate with lower urinary tract symptoms: Secondary | ICD-10-CM | POA: Diagnosis not present

## 2015-08-08 DIAGNOSIS — F22 Delusional disorders: Secondary | ICD-10-CM

## 2015-08-08 DIAGNOSIS — I951 Orthostatic hypotension: Secondary | ICD-10-CM

## 2015-08-08 DIAGNOSIS — M109 Gout, unspecified: Secondary | ICD-10-CM | POA: Diagnosis not present

## 2015-08-08 DIAGNOSIS — K5100 Ulcerative (chronic) pancolitis without complications: Secondary | ICD-10-CM | POA: Diagnosis not present

## 2015-08-08 DIAGNOSIS — F39 Unspecified mood [affective] disorder: Secondary | ICD-10-CM

## 2015-08-20 DIAGNOSIS — L03116 Cellulitis of left lower limb: Secondary | ICD-10-CM | POA: Diagnosis not present

## 2015-08-27 DIAGNOSIS — L03116 Cellulitis of left lower limb: Secondary | ICD-10-CM | POA: Diagnosis not present

## 2015-08-27 DIAGNOSIS — L97909 Non-pressure chronic ulcer of unspecified part of unspecified lower leg with unspecified severity: Secondary | ICD-10-CM | POA: Diagnosis not present

## 2015-09-11 DIAGNOSIS — L97909 Non-pressure chronic ulcer of unspecified part of unspecified lower leg with unspecified severity: Secondary | ICD-10-CM | POA: Diagnosis not present

## 2015-09-17 DIAGNOSIS — L03116 Cellulitis of left lower limb: Secondary | ICD-10-CM | POA: Diagnosis not present

## 2015-09-25 DIAGNOSIS — F0390 Unspecified dementia without behavioral disturbance: Secondary | ICD-10-CM | POA: Diagnosis not present

## 2015-09-25 DIAGNOSIS — L97822 Non-pressure chronic ulcer of other part of left lower leg with fat layer exposed: Secondary | ICD-10-CM | POA: Diagnosis not present

## 2015-09-25 DIAGNOSIS — N183 Chronic kidney disease, stage 3 (moderate): Secondary | ICD-10-CM | POA: Diagnosis not present

## 2015-09-25 DIAGNOSIS — Z8673 Personal history of transient ischemic attack (TIA), and cerebral infarction without residual deficits: Secondary | ICD-10-CM | POA: Diagnosis not present

## 2015-09-26 DIAGNOSIS — L97829 Non-pressure chronic ulcer of other part of left lower leg with unspecified severity: Secondary | ICD-10-CM | POA: Diagnosis not present

## 2015-09-27 DIAGNOSIS — L97829 Non-pressure chronic ulcer of other part of left lower leg with unspecified severity: Secondary | ICD-10-CM | POA: Diagnosis not present

## 2015-10-02 DIAGNOSIS — L97822 Non-pressure chronic ulcer of other part of left lower leg with fat layer exposed: Secondary | ICD-10-CM | POA: Diagnosis not present

## 2015-10-11 DIAGNOSIS — Z23 Encounter for immunization: Secondary | ICD-10-CM | POA: Diagnosis not present

## 2015-10-11 DIAGNOSIS — K5140 Inflammatory polyps of colon without complications: Secondary | ICD-10-CM | POA: Diagnosis not present

## 2015-10-11 DIAGNOSIS — F062 Psychotic disorder with delusions due to known physiological condition: Secondary | ICD-10-CM | POA: Diagnosis not present

## 2015-10-11 DIAGNOSIS — L97822 Non-pressure chronic ulcer of other part of left lower leg with fat layer exposed: Secondary | ICD-10-CM | POA: Diagnosis not present

## 2015-10-11 DIAGNOSIS — F0151 Vascular dementia with behavioral disturbance: Secondary | ICD-10-CM | POA: Diagnosis not present

## 2015-10-11 DIAGNOSIS — F39 Unspecified mood [affective] disorder: Secondary | ICD-10-CM | POA: Diagnosis not present

## 2015-10-17 DIAGNOSIS — L270 Generalized skin eruption due to drugs and medicaments taken internally: Secondary | ICD-10-CM | POA: Diagnosis not present

## 2015-10-17 DIAGNOSIS — Z882 Allergy status to sulfonamides status: Secondary | ICD-10-CM | POA: Diagnosis not present

## 2015-10-18 DIAGNOSIS — L97822 Non-pressure chronic ulcer of other part of left lower leg with fat layer exposed: Secondary | ICD-10-CM | POA: Diagnosis not present

## 2015-10-25 DIAGNOSIS — L97822 Non-pressure chronic ulcer of other part of left lower leg with fat layer exposed: Secondary | ICD-10-CM | POA: Diagnosis not present

## 2015-10-26 DIAGNOSIS — M6281 Muscle weakness (generalized): Secondary | ICD-10-CM | POA: Diagnosis not present

## 2015-10-26 DIAGNOSIS — R2689 Other abnormalities of gait and mobility: Secondary | ICD-10-CM | POA: Diagnosis not present

## 2015-10-29 DIAGNOSIS — M6281 Muscle weakness (generalized): Secondary | ICD-10-CM | POA: Diagnosis not present

## 2015-10-29 DIAGNOSIS — R2689 Other abnormalities of gait and mobility: Secondary | ICD-10-CM | POA: Diagnosis not present

## 2015-10-30 DIAGNOSIS — R2689 Other abnormalities of gait and mobility: Secondary | ICD-10-CM | POA: Diagnosis not present

## 2015-10-30 DIAGNOSIS — M6281 Muscle weakness (generalized): Secondary | ICD-10-CM | POA: Diagnosis not present

## 2015-10-31 DIAGNOSIS — M6281 Muscle weakness (generalized): Secondary | ICD-10-CM | POA: Diagnosis not present

## 2015-10-31 DIAGNOSIS — R2689 Other abnormalities of gait and mobility: Secondary | ICD-10-CM | POA: Diagnosis not present

## 2015-11-01 DIAGNOSIS — R2689 Other abnormalities of gait and mobility: Secondary | ICD-10-CM | POA: Diagnosis not present

## 2015-11-01 DIAGNOSIS — Z8673 Personal history of transient ischemic attack (TIA), and cerebral infarction without residual deficits: Secondary | ICD-10-CM | POA: Diagnosis not present

## 2015-11-01 DIAGNOSIS — F0390 Unspecified dementia without behavioral disturbance: Secondary | ICD-10-CM | POA: Diagnosis not present

## 2015-11-01 DIAGNOSIS — N183 Chronic kidney disease, stage 3 (moderate): Secondary | ICD-10-CM | POA: Diagnosis not present

## 2015-11-01 DIAGNOSIS — L97822 Non-pressure chronic ulcer of other part of left lower leg with fat layer exposed: Secondary | ICD-10-CM | POA: Diagnosis not present

## 2015-11-01 DIAGNOSIS — M6281 Muscle weakness (generalized): Secondary | ICD-10-CM | POA: Diagnosis not present

## 2015-11-05 DIAGNOSIS — M6281 Muscle weakness (generalized): Secondary | ICD-10-CM | POA: Diagnosis not present

## 2015-11-05 DIAGNOSIS — R2689 Other abnormalities of gait and mobility: Secondary | ICD-10-CM | POA: Diagnosis not present

## 2015-11-06 DIAGNOSIS — M6281 Muscle weakness (generalized): Secondary | ICD-10-CM | POA: Diagnosis not present

## 2015-11-06 DIAGNOSIS — R2689 Other abnormalities of gait and mobility: Secondary | ICD-10-CM | POA: Diagnosis not present

## 2015-11-07 DIAGNOSIS — M6281 Muscle weakness (generalized): Secondary | ICD-10-CM | POA: Diagnosis not present

## 2015-11-07 DIAGNOSIS — R2689 Other abnormalities of gait and mobility: Secondary | ICD-10-CM | POA: Diagnosis not present

## 2015-11-08 DIAGNOSIS — S81802A Unspecified open wound, left lower leg, initial encounter: Secondary | ICD-10-CM | POA: Diagnosis not present

## 2015-11-08 DIAGNOSIS — M6281 Muscle weakness (generalized): Secondary | ICD-10-CM | POA: Diagnosis not present

## 2015-11-08 DIAGNOSIS — F0390 Unspecified dementia without behavioral disturbance: Secondary | ICD-10-CM | POA: Diagnosis not present

## 2015-11-08 DIAGNOSIS — R2689 Other abnormalities of gait and mobility: Secondary | ICD-10-CM | POA: Diagnosis not present

## 2015-11-08 DIAGNOSIS — N183 Chronic kidney disease, stage 3 (moderate): Secondary | ICD-10-CM | POA: Diagnosis not present

## 2015-11-09 DIAGNOSIS — R2689 Other abnormalities of gait and mobility: Secondary | ICD-10-CM | POA: Diagnosis not present

## 2015-11-09 DIAGNOSIS — M6281 Muscle weakness (generalized): Secondary | ICD-10-CM | POA: Diagnosis not present

## 2015-11-10 ENCOUNTER — Emergency Department: Payer: Medicare Other

## 2015-11-10 ENCOUNTER — Encounter: Payer: Self-pay | Admitting: Emergency Medicine

## 2015-11-10 ENCOUNTER — Inpatient Hospital Stay
Admission: EM | Admit: 2015-11-10 | Discharge: 2015-11-13 | DRG: 871 | Disposition: A | Discharge status: Hospice | Payer: Medicare Other | Attending: Internal Medicine | Admitting: Internal Medicine

## 2015-11-10 DIAGNOSIS — F0390 Unspecified dementia without behavioral disturbance: Secondary | ICD-10-CM | POA: Diagnosis present

## 2015-11-10 DIAGNOSIS — I10 Essential (primary) hypertension: Secondary | ICD-10-CM | POA: Diagnosis not present

## 2015-11-10 DIAGNOSIS — E876 Hypokalemia: Secondary | ICD-10-CM | POA: Diagnosis present

## 2015-11-10 DIAGNOSIS — Y95 Nosocomial condition: Secondary | ICD-10-CM | POA: Diagnosis present

## 2015-11-10 DIAGNOSIS — Z7189 Other specified counseling: Secondary | ICD-10-CM | POA: Diagnosis not present

## 2015-11-10 DIAGNOSIS — A419 Sepsis, unspecified organism: Secondary | ICD-10-CM | POA: Diagnosis present

## 2015-11-10 DIAGNOSIS — S81802A Unspecified open wound, left lower leg, initial encounter: Secondary | ICD-10-CM | POA: Diagnosis present

## 2015-11-10 DIAGNOSIS — F419 Anxiety disorder, unspecified: Secondary | ICD-10-CM | POA: Diagnosis present

## 2015-11-10 DIAGNOSIS — Z85038 Personal history of other malignant neoplasm of large intestine: Secondary | ICD-10-CM | POA: Diagnosis not present

## 2015-11-10 DIAGNOSIS — Z8546 Personal history of malignant neoplasm of prostate: Secondary | ICD-10-CM | POA: Diagnosis not present

## 2015-11-10 DIAGNOSIS — Z86010 Personal history of colonic polyps: Secondary | ICD-10-CM | POA: Diagnosis not present

## 2015-11-10 DIAGNOSIS — R451 Restlessness and agitation: Secondary | ICD-10-CM | POA: Diagnosis present

## 2015-11-10 DIAGNOSIS — F0150 Vascular dementia without behavioral disturbance: Secondary | ICD-10-CM | POA: Diagnosis not present

## 2015-11-10 DIAGNOSIS — Z7982 Long term (current) use of aspirin: Secondary | ICD-10-CM | POA: Diagnosis not present

## 2015-11-10 DIAGNOSIS — N390 Urinary tract infection, site not specified: Secondary | ICD-10-CM | POA: Diagnosis present

## 2015-11-10 DIAGNOSIS — N183 Chronic kidney disease, stage 3 (moderate): Secondary | ICD-10-CM | POA: Diagnosis present

## 2015-11-10 DIAGNOSIS — M109 Gout, unspecified: Secondary | ICD-10-CM | POA: Diagnosis present

## 2015-11-10 DIAGNOSIS — J189 Pneumonia, unspecified organism: Secondary | ICD-10-CM

## 2015-11-10 DIAGNOSIS — F0391 Unspecified dementia with behavioral disturbance: Secondary | ICD-10-CM | POA: Diagnosis not present

## 2015-11-10 DIAGNOSIS — J181 Lobar pneumonia, unspecified organism: Secondary | ICD-10-CM | POA: Diagnosis not present

## 2015-11-10 DIAGNOSIS — Z8551 Personal history of malignant neoplasm of bladder: Secondary | ICD-10-CM

## 2015-11-10 DIAGNOSIS — Z951 Presence of aortocoronary bypass graft: Secondary | ICD-10-CM

## 2015-11-10 DIAGNOSIS — E7800 Pure hypercholesterolemia, unspecified: Secondary | ICD-10-CM | POA: Diagnosis present

## 2015-11-10 DIAGNOSIS — Z833 Family history of diabetes mellitus: Secondary | ICD-10-CM

## 2015-11-10 DIAGNOSIS — R0600 Dyspnea, unspecified: Secondary | ICD-10-CM | POA: Diagnosis not present

## 2015-11-10 DIAGNOSIS — Z79899 Other long term (current) drug therapy: Secondary | ICD-10-CM | POA: Diagnosis not present

## 2015-11-10 DIAGNOSIS — Z515 Encounter for palliative care: Secondary | ICD-10-CM | POA: Diagnosis not present

## 2015-11-10 DIAGNOSIS — I129 Hypertensive chronic kidney disease with stage 1 through stage 4 chronic kidney disease, or unspecified chronic kidney disease: Secondary | ICD-10-CM | POA: Diagnosis present

## 2015-11-10 DIAGNOSIS — I2510 Atherosclerotic heart disease of native coronary artery without angina pectoris: Secondary | ICD-10-CM | POA: Diagnosis present

## 2015-11-10 DIAGNOSIS — Z87891 Personal history of nicotine dependence: Secondary | ICD-10-CM | POA: Diagnosis not present

## 2015-11-10 DIAGNOSIS — R509 Fever, unspecified: Secondary | ICD-10-CM | POA: Diagnosis not present

## 2015-11-10 DIAGNOSIS — B964 Proteus (mirabilis) (morganii) as the cause of diseases classified elsewhere: Secondary | ICD-10-CM | POA: Diagnosis present

## 2015-11-10 DIAGNOSIS — Z823 Family history of stroke: Secondary | ICD-10-CM

## 2015-11-10 DIAGNOSIS — Z7401 Bed confinement status: Secondary | ICD-10-CM | POA: Diagnosis not present

## 2015-11-10 DIAGNOSIS — Z77090 Contact with and (suspected) exposure to asbestos: Secondary | ICD-10-CM | POA: Diagnosis present

## 2015-11-10 DIAGNOSIS — Z66 Do not resuscitate: Secondary | ICD-10-CM | POA: Diagnosis present

## 2015-11-10 DIAGNOSIS — Z96641 Presence of right artificial hip joint: Secondary | ICD-10-CM | POA: Diagnosis present

## 2015-11-10 LAB — URINALYSIS COMPLETE WITH MICROSCOPIC (ARMC ONLY)
Bilirubin Urine: NEGATIVE
Glucose, UA: NEGATIVE mg/dL
Ketones, ur: NEGATIVE mg/dL
NITRITE: NEGATIVE
PH: 6 (ref 5.0–8.0)
PROTEIN: 30 mg/dL — AB
SPECIFIC GRAVITY, URINE: 1.014 (ref 1.005–1.030)

## 2015-11-10 LAB — COMPREHENSIVE METABOLIC PANEL
ALT: 12 U/L — ABNORMAL LOW (ref 17–63)
ANION GAP: 6 (ref 5–15)
AST: 21 U/L (ref 15–41)
Albumin: 3.3 g/dL — ABNORMAL LOW (ref 3.5–5.0)
Alkaline Phosphatase: 69 U/L (ref 38–126)
BUN: 30 mg/dL — ABNORMAL HIGH (ref 6–20)
CHLORIDE: 105 mmol/L (ref 101–111)
CO2: 29 mmol/L (ref 22–32)
Calcium: 8.7 mg/dL — ABNORMAL LOW (ref 8.9–10.3)
Creatinine, Ser: 1.23 mg/dL (ref 0.61–1.24)
GFR, EST AFRICAN AMERICAN: 56 mL/min — AB (ref >60)
GFR, EST NON AFRICAN AMERICAN: 48 mL/min — AB (ref >60)
Glucose, Bld: 100 mg/dL — ABNORMAL HIGH (ref 65–99)
POTASSIUM: 3.6 mmol/L (ref 3.5–5.1)
Sodium: 140 mmol/L (ref 135–145)
Total Bilirubin: 0.5 mg/dL (ref 0.3–1.2)
Total Protein: 6.7 g/dL (ref 6.5–8.1)

## 2015-11-10 LAB — CBC WITH DIFFERENTIAL/PLATELET
Basophils Absolute: 0.1 10*3/uL (ref 0–0.1)
Basophils Relative: 1 %
Eosinophils Absolute: 0.2 10*3/uL (ref 0–0.7)
Eosinophils Relative: 2 %
HCT: 38.5 % — ABNORMAL LOW (ref 40.0–52.0)
HEMOGLOBIN: 12.9 g/dL — AB (ref 13.0–18.0)
LYMPHS ABS: 0.4 10*3/uL — AB (ref 1.0–3.6)
LYMPHS PCT: 4 %
MCH: 30.2 pg (ref 26.0–34.0)
MCHC: 33.5 g/dL (ref 32.0–36.0)
MCV: 90 fL (ref 80.0–100.0)
Monocytes Absolute: 0.7 10*3/uL (ref 0.2–1.0)
Monocytes Relative: 7 %
NEUTROS PCT: 86 %
Neutro Abs: 8.9 10*3/uL — ABNORMAL HIGH (ref 1.4–6.5)
Platelets: 132 10*3/uL — ABNORMAL LOW (ref 150–440)
RBC: 4.28 MIL/uL — AB (ref 4.40–5.90)
RDW: 17.8 % — ABNORMAL HIGH (ref 11.5–14.5)
WBC: 10.2 10*3/uL (ref 3.8–10.6)

## 2015-11-10 LAB — PROTIME-INR
INR: 1.22
PROTHROMBIN TIME: 15.5 s — AB (ref 11.4–15.2)

## 2015-11-10 LAB — TROPONIN I: Troponin I: <0.03 ng/mL (ref <0.03)

## 2015-11-10 LAB — LACTIC ACID, PLASMA
Lactic Acid, Venous: 1.6 mmol/L (ref 0.5–1.9)
Lactic Acid, Venous: 1 mmol/L (ref 0.5–1.9)

## 2015-11-10 MED ORDER — ACETAMINOPHEN 325 MG PO TABS: 650.0000 mg | ORAL_TABLET | Freq: Once | ORAL | Status: DC

## 2015-11-10 MED ORDER — IPRATROPIUM-ALBUTEROL 0.5-2.5 (3) MG/3ML IN SOLN
3.0000 mL | Freq: Four times a day (QID) | RESPIRATORY_TRACT | Status: DC
  Administered 2015-11-10 – 2015-11-12 (×9): 3 mL via RESPIRATORY_TRACT
  Filled 2015-11-10 (×10): qty 3

## 2015-11-10 MED ORDER — ACETAMINOPHEN 650 MG RE SUPP
RECTAL | Status: AC
  Administered 2015-11-10: 650 mg
  Filled 2015-11-10: qty 1

## 2015-11-10 MED ORDER — SODIUM CHLORIDE 0.9 % IV BOLUS (SEPSIS)
1000.0000 mL | Freq: Once | INTRAVENOUS | Status: AC
  Administered 2015-11-10: 1000 mL via INTRAVENOUS

## 2015-11-10 MED ORDER — PIPERACILLIN-TAZOBACTAM 3.375 G IVPB 30 MIN: 3.3750 g | Freq: Three times a day (TID) | INTRAVENOUS | Status: DC

## 2015-11-10 MED ORDER — ONDANSETRON HCL 4 MG PO TABS: 4.0000 mg | ORAL_TABLET | Freq: Four times a day (QID) | ORAL | Status: DC | PRN

## 2015-11-10 MED ORDER — PIPERACILLIN-TAZOBACTAM 3.375 G IVPB
3.3750 g | Freq: Three times a day (TID) | INTRAVENOUS | Status: DC
  Administered 2015-11-10 – 2015-11-12 (×5): 3.375 g via INTRAVENOUS
  Filled 2015-11-10 (×6): qty 50

## 2015-11-10 MED ORDER — VANCOMYCIN HCL IN DEXTROSE 1-5 GM/200ML-% IV SOLN
1000.0000 mg | INTRAVENOUS | Status: DC
  Administered 2015-11-10 – 2015-11-11 (×2): 1000 mg via INTRAVENOUS
  Filled 2015-11-10 (×3): qty 200

## 2015-11-10 MED ORDER — VANCOMYCIN HCL IN DEXTROSE 1-5 GM/200ML-% IV SOLN
1000.0000 mg | Freq: Once | INTRAVENOUS | Status: AC
  Administered 2015-11-10: 1000 mg via INTRAVENOUS
  Filled 2015-11-10: qty 200

## 2015-11-10 MED ORDER — ENOXAPARIN SODIUM 40 MG/0.4ML ~~LOC~~ SOLN
40.0000 mg | SUBCUTANEOUS | Status: DC
  Administered 2015-11-10 – 2015-11-11 (×2): 40 mg via SUBCUTANEOUS
  Filled 2015-11-10 (×2): qty 0.4

## 2015-11-10 MED ORDER — ONDANSETRON HCL 4 MG/2ML IJ SOLN: 4.0000 mg | Freq: Four times a day (QID) | INTRAMUSCULAR | Status: DC | PRN

## 2015-11-10 MED ORDER — PIPERACILLIN-TAZOBACTAM 3.375 G IVPB 30 MIN
3.3750 g | Freq: Once | INTRAVENOUS | Status: AC
  Administered 2015-11-10: 3.375 g via INTRAVENOUS
  Filled 2015-11-10: qty 50

## 2015-11-10 MED ORDER — FLUTICASONE PROPIONATE 50 MCG/ACT NA SUSP
2.0000 | Freq: Every evening | NASAL | Status: DC
  Administered 2015-11-10 – 2015-11-12 (×3): 2 via NASAL
  Filled 2015-11-10: qty 16

## 2015-11-10 MED ORDER — SODIUM CHLORIDE 0.9 % IV SOLN
INTRAVENOUS | Status: AC
  Administered 2015-11-10: 17:00:00 via INTRAVENOUS

## 2015-11-10 MED ORDER — ACETAMINOPHEN 650 MG RE SUPP
650.0000 mg | Freq: Four times a day (QID) | RECTAL | Status: DC | PRN
  Administered 2015-11-11: 16:00:00 650 mg via RECTAL
  Filled 2015-11-10: qty 1

## 2015-11-10 MED ORDER — ACETAMINOPHEN 325 MG PO TABS: 650.0000 mg | ORAL_TABLET | Freq: Four times a day (QID) | ORAL | Status: DC | PRN

## 2015-11-10 MED ORDER — HALOPERIDOL LACTATE 5 MG/ML IJ SOLN
2.0000 mg | Freq: Three times a day (TID) | INTRAMUSCULAR | Status: DC | PRN
  Administered 2015-11-10 – 2015-11-11 (×3): 2 mg via INTRAVENOUS
  Filled 2015-11-10 (×3): qty 1

## 2015-11-10 MED ORDER — NITROGLYCERIN 0.4 MG SL SUBL: 0.4000 mg | SUBLINGUAL_TABLET | SUBLINGUAL | Status: DC | PRN

## 2015-11-10 NOTE — Progress Notes (Signed)
Family Meeting Note  Advance Directive:yes  Today a meeting took place with the patient's granddaughter Dustin Byrd  Patient is unable to participate due [id number]. Patient's daughter Dustin Byrd insulin is the healthcare power of attorney who is unable to participate as she is tired and resting at this time and granddaughter requested not to call her The following clinical team members were present during this meeting:MD   The following were discussed:Patient's diagnosis: , Patient's progosis: Guarded and Goals for treatment: DNR, palliative care consult  Additional follow-up to be provided: By hospitalist team and palliative care  Time spent during discussion:16 MIN  [name], MD

## 2015-11-10 NOTE — ED Notes (Signed)
Resting with eyes closed. Occasionally talks in sleep. Await assigned room ready.

## 2015-11-10 NOTE — ED Provider Notes (Signed)
[facility] Emergency Department Provider Note  Time seen: 9:13 AM  I have reviewed the triage vital signs and the nursing notes.   HISTORY  Chief Complaint Code Sepsis    HPI Dustin Byrd is a 82 y.o. male with a past medical history of this best this, CK D, hyperlipidemia, hypertension, dementia, who presents the emergency department from his nursing facility with fever and cough. According to the patient's daughter beginning yesterday the patient has had a cough, today had a fever greater than 101 so sent to the emergency department for evaluation. Upon arrival to the emergency department the patient has a room air saturation of 90%, no baseline oxygen requirement. Patient has dementia denies any pain or chest pain, but cannot contribute much to his history.  Past Medical History:  Diagnosis Date  . Anxiety   . Asbestos exposure   . 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 CAD (coronary artery disease)   . Chronic kidney disease, stage III (moderate)   . Colon cancer 1994  . Diverticulosis of colon (without mention of hemorrhage)   . DJD of shoulder   . Encounter for long-term (current) use of other medications   . Esophageal stricture   . Gout   . Hypercholesterolemia   . Memory loss   . Orthostatic hypotension   . Personal history of colonic polyps 1991   villous adenoma of cecum  . UC (ulcerative colitis)     Patient Active Problem List   Diagnosis Date Noted  . Acute urinary retention 09/08/2014  . CAP (community acquired pneumonia) 09/08/2014  . Fever presenting with conditions classified elsewhere 09/08/2014  . Ulcerative colitis ([place]) 09/08/2014  . GERD (gastroesophageal reflux disease) 09/08/2014  . Alzheimer's dementia 09/08/2014  . Fractured femoral neck ([place]) 09/04/2014  . Orthostatic hypotension 09/29/2013  . Irritant dermatitis 12/23/2012  . Dementia,  unspecified, without behavioral disturbance 05/01/2011  . Constipation, chronic 05/01/2011  . Gout, unspecified 05/01/2011  . Episodic mood disorder ([place])   . COLON CANCER 01/29/2007  . PROSTATE CANCER 01/29/2007  . BLADDER CANCER 01/29/2007  . HYPERLIPIDEMIA 01/29/2007  . THROMBOCYTOPENIA 01/29/2007  . HYPERTENSION 01/29/2007  . CAD 01/29/2007  . COLITIS, ULCERATIVE 01/29/2007  . DIVERTICULOSIS, COLON 01/29/2007    Past Surgical History:  Procedure Laterality Date  . COLOSTOMY TAKEDOWN  1996  . CORONARY ARTERY BYPASS GRAFT     x4  . HEMICOLECTOMY  1991   right  . HIP ARTHROPLASTY Right 09/05/2014   Procedure: ARTHROPLASTY BIPOLAR HIP (HEMIARTHROPLASTY);  Surgeon: Corky Mull, MD;  Location: [facility] ORS;  Service: Orthopedics;  Laterality: Right;  . open heart surgery     CABG x4    Prior to Admission medications   Medication Sig Start Date End Date Taking? Authorizing Provider  acetaminophen (TYLENOL) 325 MG tablet Take 650 mg by mouth once as needed for fever (increased pain or temp.).    Historical Provider, MD  allopurinol (ZYLOPRIM) 100 MG tablet TAKE 1 TABLET BY MOUTH ONCE DAILY FOR GOUT 11/07/13   Venia Carbon, MD  aspirin 81 MG chewable tablet TAKE 1 TABLET BY MOUTH EACH DAY. ANTI PLATELET AGGREGATION 11/07/13   Venia Carbon, MD  citalopram (CELEXA) 20 MG tablet Take 20 mg by mouth every morning.    Historical Provider, MD  COLCRYS 0.6 MG tablet TAKE 1 TABLET BY MOUTH ONCE DAILY FOR GOUT 07/29/12   Venia Carbon, MD  Dermatological Products, Misc. (ITCH-ENDER!) LOTN APPLY TO ULCER AREA 3 TIMES A DAY AS DIRECTED. 06/17/12   Venia Carbon, MD  enoxaparin (LOVENOX) 40 MG/0.4ML injection Inject 0.4 mLs (40 mg total) into the skin daily. 09/08/14   Lattie Corns, PA-C  finasteride (PROSCAR) 5 MG tablet Take 1 tablet (5 mg total) by mouth daily. 09/08/14   Theodoro Grist, MD  fludrocortisone (FLORINEF) 0.1 MG tablet Take 0.1 mg by mouth every morning.     Historical  Provider, MD  fluticasone (FLONASE) 50 MCG/ACT nasal spray Place 2 sprays into both nostrils every evening.     Historical Provider, MD  loratadine (CLARITIN) 10 MG tablet Take 1 tab BID x 5 days then daily prn thereafter 12/21/13   Jearld Fenton, NP  LORazepam (ATIVAN) 0.5 MG tablet Take 0.5 tablets (0.25 mg total) by mouth every morning. 09/08/14   Theodoro Grist, MD  LORazepam (ATIVAN) 0.5 MG tablet Take 0.5 tablets (0.25 mg total) by mouth every 8 (eight) hours as needed for anxiety. 1/2 tab up to 3 times daily as needed for anxiety 09/08/14   Theodoro Grist, MD  mineral oil enema Place 1 enema rectally once a week. Once a week as needed for constipation.    Historical Provider, MD  nitroGLYCERIN (NITROSTAT) 0.4 MG SL tablet Place 1 tablet (0.4 mg total) under the tongue every 5 (five) minutes as needed. For chest pain. Call 911 after 3 doses if no relief. 08/05/12   Venia Carbon, MD  oxyCODONE (OXY IR/ROXICODONE) 5 MG immediate release tablet Take 1-2 tablets (5-10 mg total) by mouth every 4 (four) hours as needed for breakthrough pain. 09/08/14   Margarita Sermons McGhee, PA-C  PENTASA 250 MG CR capsule TAKE 1 CAPSULE BY MOUTH FOUR TIMES A DAY FOR ULCERATIVE COLITIS. 08/05/12   Venia Carbon, MD  polyethylene glycol [facility] / GLYCOLAX) packet Take 17 g by mouth 2 (two) times daily. Patient taking differently: Take 17 g by mouth at bedtime.  10/19/12   Venia Carbon, MD  sennosides-docusate sodium (SENOKOT-S) 8.6-50 MG tablet TAKE 2 TABLETS BY MOUTH TWICE A DAY FOR STOOL SOFTENER. TAKE WITH GLASS OF WATER. 08/12/12   Venia Carbon, MD  SILVADENE 1 % cream APPLY EVERY DAY TO ULCER UNTIL HEALED 12/07/13   Venia Carbon, MD  tamsulosin (FLOMAX) 0.4 MG CAPS capsule Take 1 capsule (0.4 mg total) by mouth daily. 09/08/14   Theodoro Grist, MD    Allergies  Allergen Reactions  . Aricept [Donepezil Hydrochloride]     agitation  . Cortisone   . Prednisone     Mania     Family History  Problem  Relation Age of Onset  . Diabetes Father   . Bipolar disorder Daughter   . Stroke Mother     Social History Social History  Substance Use Topics  . Smoking status: Former Research scientist (life sciences)  . Smokeless tobacco: Never Used  . Alcohol use Yes     Comment: 1-2 oz Scotch per night    Review of Systems Constitutional: Positive for fever Cardiovascular: Negative for chest pain. Respiratory: Positive for shortness of breath. Positive for cough. Gastrointestinal: Negative for abdominal pain Neurological: Negative for headache 10-point ROS otherwise negative, Although somewhat limited by the history of dementia.  ____________________________________________   PHYSICAL EXAM:  Constitutional: Alert. Well appearing and in no distress. Eyes: Normal exam ENT   Head: Normocephalic and atraumatic.   Mouth/Throat: Mucous membranes are moist. Cardiovascular: Normal rate, regular rhythm. No  murmur Respiratory: Normal respiratory effort without tachypnea nor retractions. Mild expiratory wheeze bilaterally. Gastrointestinal: Soft and nontender. No distention.   Musculoskeletal: Nontender with normal range of motion in all extremities. Mild lower extremity edema, equal bilaterally. Neurologic:  Normal speech and language. No gross focal neurologic deficits  Skin:  Skin is warm, dry and intact.  Psychiatric: Mood and affect are normal. Speech and behavior are normal.   ____________________________________________    EKG  EKG reviewed and interpreted by myself shows normal sinus rhythm at 87 bpm, narrow QRS, normal axis, normal intervals besides a slightly prolonged QTC 509 ms, nonspecific but no concerning ST changes.  ____________________________________________    RADIOLOGY  Chest x-ray shows possible right lower lobe pneumonia  ____________________________________________   INITIAL IMPRESSION / ASSESSMENT AND PLAN / ED COURSE  Pertinent labs & imaging results that were available  during my care of the patient were reviewed by me and considered in my medical decision making (see chart for details).  The patient presents to the emergency department from his nursing facility with difficulty breathing, cough and fever. Patient satting 90% on room air, no respiratory distress. Patient does have a fever to 101 in the emergency department. We'll treat with Tylenol. Given the patient's new oxygen requirement, fever and cough I have ordered sepsis protocols. We will cover with broad-spectrum antibiotics while awaiting results.  X-ray consistent with likely right lower lobe pneumonia which would be consistent with hypoxia and fever. We will admit the patient to the hospital for further treatment.  CRITICAL CARE Performed by: Harvest Dark   Total critical care time: 30 minutes  Critical care time was exclusive of separately billable procedures and treating other patients.  Critical care was necessary to treat or prevent imminent or life-threatening deterioration.  Critical care was time spent personally by me on the following activities: development of treatment plan with patient and/or surrogate as well as nursing, discussions with consultants, evaluation of patient's response to treatment, examination of patient, obtaining history from patient or surrogate, ordering and performing treatments and interventions, ordering and review of laboratory studies, ordering and review of radiographic studies, pulse oximetry and re-evaluation of patient's condition.   ____________________________________________   FINAL CLINICAL IMPRESSION(S) / ED DIAGNOSES  Sepsis Pneumonia   Harvest Dark, MD 11/10/15 1205

## 2015-11-10 NOTE — Progress Notes (Addendum)
Pharmacy Antibiotic Note  Dustin Byrd is a 82 y.o. male admitted on 11/10/2015 with sepsis/PNA.  Pharmacy has been consulted for Vancomycin and Zosyn dosing. Patient received 1 dose Vancomycin 1gm IV and Zosyn 3.375 IV in ED.  Plan: Ke: 0.034   T1/2:  20.4   Vd: 49 Will start patient on vancomycin 1gm IV every 24 hours with 7 hour stack dosing. Estimated trough at Css 17.5. Trough ordered prior to 5th dose. Will continue to monitor renal function and adjust dose as needed.  Will start Zosyn 3.375 IV EI every 8 hours.    Height: 5\' 10"  (177.8 cm) Weight: 159 lb (72.1 kg) IBW/kg (Calculated) : 73  Temp (24hrs), Avg:100.2 F (37.9 C), Min:98.7 F (37.1 C), Max:101.6 F (38.7 C)   Recent Labs Lab 11/10/15 0854  WBC 10.2  CREATININE 1.23  LATICACIDVEN 1.6    Estimated Creatinine Clearance: 36.6 mL/min (by C-G formula based on SCr of 1.23 mg/dL).    Allergies  Allergen Reactions  . Aricept [Donepezil Hydrochloride]     agitation  . Cortisone   . Prednisone     Mania   . Sulfa Antibiotics Other (See Comments)    Listed on nursing home chart    Antimicrobials this admission: 11/4 Zosyn >>  11/4 Vancomycin  >>   Dose adjustments this admission:   Microbiology results: 11/4 BCx:  11/4 UCx:   11/4 Wound Cx: 11/4 Sputum Cx: MRSA PCR:  MRSA PCR:   Thank you for allowing pharmacy to be a part of this patient's care.  Pernell Dupre, PharmD Clinical Pharmacist  11/10/2015 2:54 PM

## 2015-11-10 NOTE — ED Triage Notes (Signed)
Began fever and low SAT at [facility] this am, decreased alertness, but is able to answer questions briefly.

## 2015-11-10 NOTE — H&P (Signed)
[facility] [place] NAME: Dustin Byrd    MR#:  [id number]  DATE OF BIRTH:  11-13-1918  DATE OF ADMISSION:  11/10/2015  PRIMARY CARE PHYSICIAN: Viviana Simpler, MD   REQUESTING/REFERRING PHYSICIAN:   CHIEF COMPLAINT:   Fever HISTORY OF PRESENT ILLNESS:  Dustin Byrd  is a 82 y.o. male with a known history of  Coronary artery disease, chronic kidney disease stage III, colon cancer, bladder cancer, prostate cancer, essential hypertension multiple other medical problems has been having cough from yesterday and today he had temp of 101. Granddaughter has brought him into the emergency department . Chest x-ray with the acute process in the right  Lung. Patient is somewhat lethargic during my examination but arousable and answers few questions appropriately   PAST MEDICAL HISTORY:   Past Medical History:  Diagnosis Date  . Anxiety   . Asbestos exposure   . Benign hypertensive kidney disease with chronic kidney disease stage I through stage IV, or unspecified(403.10)   . CAD (coronary artery disease)   . Chronic kidney disease, stage III (moderate)   . Colon cancer ([place]) 1994  . Diverticulosis of colon (without mention of hemorrhage)   . DJD of shoulder   . Encounter for long-term (current) use of other medications   . Esophageal stricture   . Gout   . Hypercholesterolemia   . Memory loss   . Orthostatic hypotension   . Personal history of colonic polyps 1991   villous adenoma of cecum  . UC (ulcerative colitis) ([place])     PAST SURGICAL HISTOIRY:   Past Surgical History:  Procedure Laterality Date  . COLOSTOMY TAKEDOWN  1996  . CORONARY ARTERY BYPASS GRAFT     x4  . HEMICOLECTOMY  1991   right  . HIP ARTHROPLASTY Right 09/05/2014   Procedure: ARTHROPLASTY BIPOLAR HIP (HEMIARTHROPLASTY);  Surgeon: Corky Mull, MD;  Location: [facility] ORS;  Service: Orthopedics;  Laterality: Right;  . open heart surgery     CABG x4    SOCIAL  HISTORY:   Social History  Substance Use Topics  . Smoking status: Former Research scientist (life sciences)  . Smokeless tobacco: Never Used  . Alcohol use Yes     Comment: 1-2 oz Scotch per night    FAMILY HISTORY:   Family History  Problem Relation Age of Onset  . Diabetes Father   . Bipolar disorder Daughter   . Stroke Mother     DRUG ALLERGIES:   Allergies  Allergen Reactions  . Aricept [Donepezil Hydrochloride]     agitation  . Cortisone   . Prednisone     Mania   . Sulfa Antibiotics Other (See Comments)    Listed on nursing home chart    REVIEW OF SYSTEMS:  CONSTITUTIONAL: He has  fever, fatigue or weakness.  EYES: No blurred or double vision.  RESPIRATORY: Reporting cough,  denies shortness of breath, wheezing or hemoptysis.  CARDIOVASCULAR: No chest pain, orthopnea, edema.  GASTROINTESTINAL: No nausea, vomiting, diarrhea or abdominal pain.  GENITOURINARY: No dysuria, hematuria.  SKIN: No rash or lesion. MUSCULOSKELETAL: No joint pain or arthritis.   NEUROLOGIC: No tingling, numbness, weakness.  PSYCHIATRY: No anxiety or depression.   MEDICATIONS AT HOME:   Prior to Admission medications   Medication Sig Start Date End Date Taking? Authorizing Provider  acetaminophen (TYLENOL) 325 MG tablet Take 650 mg by mouth once as needed for mild pain or fever.    Yes Historical Provider, MD  allopurinol (ZYLOPRIM) 100 MG tablet TAKE 1 TABLET BY MOUTH ONCE DAILY FOR GOUT 11/07/13  Yes Venia Carbon, MD  aspirin 81 MG chewable tablet TAKE 1 TABLET BY MOUTH EACH DAY. ANTI PLATELET AGGREGATION 11/07/13  Yes Venia Carbon, MD  citalopram (CELEXA) 20 MG tablet Take 20 mg by mouth every morning.   Yes Historical Provider, MD  COLCRYS 0.6 MG tablet TAKE 1 TABLET BY MOUTH ONCE DAILY FOR GOUT 07/29/12  Yes Venia Carbon, MD  finasteride (PROSCAR) 5 MG tablet Take 1 tablet (5 mg total) by mouth daily. 09/08/14  Yes Theodoro Grist, MD  fludrocortisone (FLORINEF) 0.1 MG tablet Take 0.1 mg by mouth  every morning.    Yes Historical Provider, MD  fluticasone (FLONASE) 50 MCG/ACT nasal spray Place 2 sprays into both nostrils every evening.    Yes Historical Provider, MD  ipratropium-albuterol (DUONEB) 0.5-2.5 (3) MG/3ML SOLN Take 3 mLs by nebulization every 4 (four) hours as needed.   Yes Historical Provider, MD  loratadine (CLARITIN) 10 MG tablet Take 1 tab BID x 5 days then daily prn thereafter 12/21/13  Yes Jearld Fenton, NP  LORazepam (ATIVAN) 0.5 MG tablet Take 0.5 tablets (0.25 mg total) by mouth every 8 (eight) hours as needed for anxiety. 1/2 tab up to 3 times daily as needed for anxiety 09/08/14  Yes Theodoro Grist, MD  mineral oil enema Place 1 enema rectally once a week. Once a week as needed for constipation.   Yes Historical Provider, MD  nitroGLYCERIN (NITROSTAT) 0.4 MG SL tablet Place 1 tablet (0.4 mg total) under the tongue every 5 (five) minutes as needed. For chest pain. Call 911 after 3 doses if no relief. 08/05/12  Yes Venia Carbon, MD  Nutritional Supplements (ARGINAID EXTRA PO) Take 1 Dose by mouth 2 (two) times daily.   Yes Historical Provider, MD  omeprazole (PRILOSEC) 20 MG capsule Take 20 mg by mouth daily.   Yes Historical Provider, MD  PENTASA 250 MG CR capsule TAKE 1 CAPSULE BY MOUTH FOUR TIMES A DAY FOR ULCERATIVE COLITIS. 08/05/12  Yes Venia Carbon, MD  polyethylene glycol (MIRALAX / GLYCOLAX) packet Take 17 g by mouth 2 (two) times daily. Patient taking differently: Take 17 g by mouth at bedtime.  10/19/12  Yes Venia Carbon, MD  sennosides-docusate sodium (SENOKOT-S) 8.6-50 MG tablet TAKE 2 TABLETS BY MOUTH TWICE A DAY FOR STOOL SOFTENER. TAKE WITH GLASS OF WATER. 08/12/12  Yes Venia Carbon, MD  tamsulosin (FLOMAX) 0.4 MG CAPS capsule Take 1 capsule (0.4 mg total) by mouth daily. 09/08/14  Yes Theodoro Grist, MD  traMADol (ULTRAM) 50 MG tablet Take 25 mg by mouth 3 (three) times daily as needed.   Yes Historical Provider, MD  Dermatological Products, [place].  (ITCH-ENDER!) LOTN APPLY TO ULCER AREA 3 TIMES A DAY AS DIRECTED. Patient not taking: Reported on 11/10/2015 06/17/12   Venia Carbon, MD  enoxaparin (LOVENOX) 40 MG/0.4ML injection Inject 0.4 mLs (40 mg total) into the skin daily. Patient not taking: Reported on 11/10/2015 09/08/14   Lattie Corns, PA-C  LORazepam (ATIVAN) 0.5 MG tablet Take 0.5 tablets (0.25 mg total) by mouth every morning. Patient not taking: Reported on 11/10/2015 09/08/14   Theodoro Grist, MD  oxyCODONE (OXY IR/ROXICODONE) 5 MG immediate release tablet Take 1-2 tablets (5-10 mg total) by mouth every 4 (four) hours as needed for breakthrough pain. Patient not taking: Reported on 11/10/2015 09/08/14   Lattie Corns, PA-C  SILVADENE  1 % cream APPLY EVERY DAY TO ULCER UNTIL HEALED Patient not taking: Reported on 11/10/2015 12/07/13   Venia Carbon, MD      VITAL SIGNS:  Blood pressure (!) 151/73, pulse 77, temperature 98.7 F (37.1 C), resp. rate 18, height 5\' 10"  (1.778 m), weight 72.1 kg (159 lb), SpO2 100 %.  PHYSICAL EXAMINATION:  GENERAL:  82 y.o.-year-old patient lying in the bed with no acute distress.  EYES: Pupils equal, round, reactive to light and accommodation. No scleral icterus. Extraocular muscles intact.  HEENT: Head atraumatic, normocephalic. Oropharynx and nasopharynx clear.  NECK:  Supple, no jugular venous distention. No thyroid enlargement, no tenderness.  LUNGS: Moderate  breath sounds bilaterally but diminished at right lower lobe, no wheezing, rales,rhonchi or crepitation. No use of accessory muscles of respiration.  CARDIOVASCULAR: S1, S2 normal. No murmurs, rubs, or gallops.  ABDOMEN: Soft, nontender, nondistended. Bowel sounds present. No organomegaly or mass.  EXTREMITIES: No pedal edema, cyanosis, or clubbing.  NEUROLOGIC: Following verbal commands to some extent. Muscle strength  S not examined as patient is lethargic ensation intact. Gait not checked.  PSYCHIATRIC: The patient is alert  and oriented x 2.  SKIN:  Left leg wound, with purulent discharge   LABORATORY PANEL:   CBC  Recent Labs Lab 11/10/15 0854  WBC 10.2  HGB 12.9*  HCT 38.5*  PLT 132*   ------------------------------------------------------------------------------------------------------------------  Chemistries   Recent Labs Lab 11/10/15 0854  NA 140  K 3.6  CL 105  CO2 29  GLUCOSE 100*  BUN 30*  CREATININE 1.23  CALCIUM 8.7*  AST 21  ALT 12*  ALKPHOS 69  BILITOT 0.5   ------------------------------------------------------------------------------------------------------------------  Cardiac Enzymes  Recent Labs Lab 11/10/15 0854  TROPONINI <0.03   ------------------------------------------------------------------------------------------------------------------  RADIOLOGY:  Dg Chest Port 1 View  Result Date: 11/10/2015 CLINICAL DATA:  Fever and low oxygen saturations. Decreased alertness. EXAM: PORTABLE CHEST 1 VIEW COMPARISON:  09/07/2014 and 04/25/2014. FINDINGS: Again noted is pleural disease or pleural thickening along the lateral right chest. This may have slightly progressed in the right upper chest region. There is also a stable vague nodular density in the right lower chest. Overall, there are slightly increased densities in the right lung and right lung base. There is a stable chronic calcification in the left lung. Heart size is upper limits of normal but stable. Evidence of prior median sternotomy. Negative for a pneumothorax. IMPRESSION: Chronic pleural and parenchymal densities in the right chest. This may be slight progression of the disease in the right lung which could represent an acute process. Electronically Signed   By: Markus Daft M.D.   On: 11/10/2015 09:33    EKG:   Orders placed or performed during the hospital encounter of 11/10/15  . ED EKG 12-Lead  . ED EKG 12-Lead    IMPRESSION AND PLAN:  Romuald Sha  is a 82 y.o. male with a known history of   Coronary artery disease, chronic kidney disease stage III, colon cancer, bladder cancer, prostate cancer, essential hypertension multiple other medical problems has been having cough from yesterday and today he had temp of 101. Granddaughter has brought him into the emergency department . Chest x-ray with the acute process in the right  Lung.  # Sepsis  Meets criteria with fever and tachycardia  - 2/2 PNA  Admit patient to [facility] unit Patient is started on Zosyn and vancomycin in the emergency department will continue the same Sputum culture and sensitivity Hydration with IV fluids  #  Left leg wound with history of MRSA Wound culture and sensitivities ordered. Wound care consult is placed. Patient is on vancomycin IV  #Essential hypertension Not on home medications. We'll start him on metoprolol and titrate as needed  #History of colon cancer, bladder cancer and prostate cancer Outpatient follow-up with oncology as recommended  DVT prophylaxis with Lovenox subcutaneous    All the records are reviewed and case discussed with ED provider. Management plans discussed with the patient and his granddaughter Ms. Shanon Brow at bedside , she will discuss the plan of care with her mom who is resting at this time and requested not to call her . Considering palliative care consult CODE STATUS:  DO NOT RESUSCITATE, daughter Charlton Haws is the healthcare power of attorney  TOTAL TIME TAKING CARE OF THIS PATIENT: 45 minutes.   Note: This dictation was prepared with Dragon dictation along with smaller phrase technology. Any transcriptional errors that result from this process are unintentional.  [name] M.D on 11/10/2015 at 3:13 PM  Between 7am to 6pm - Pager - [phone]  After 6pm go to [email or web address] - password [email or web address] [place] [facility]  Office  [phone]  CC: Primary care physician; Viviana Simpler, MD

## 2015-11-10 NOTE — Progress Notes (Signed)
Spoke with Dr Margaretmary Eddy to clarify reason for Contact Isolation Precautions; Dr Margaretmary Eddy indicated that the pt's granddaughter was at the bedside in the ED and indicated that the pt had MRSA in the wound on the LLE; no results in [facility] of MRSA history; no isolation on door at present waiting on results in [facility] for previously swabbed pt's nares per protocol for MRSA PCR results

## 2015-11-10 NOTE — ED Notes (Signed)
Report to Portal. Pt to room 110 with tech

## 2015-11-11 LAB — COMPREHENSIVE METABOLIC PANEL
ALBUMIN: 3.1 g/dL — AB (ref 3.5–5.0)
ALT: 12 U/L — ABNORMAL LOW (ref 17–63)
ANION GAP: 8 (ref 5–15)
AST: 25 U/L (ref 15–41)
Alkaline Phosphatase: 62 U/L (ref 38–126)
BILIRUBIN TOTAL: 1.3 mg/dL — AB (ref 0.3–1.2)
BUN: 24 mg/dL — ABNORMAL HIGH (ref 6–20)
CO2: 25 mmol/L (ref 22–32)
Calcium: 8.2 mg/dL — ABNORMAL LOW (ref 8.9–10.3)
Chloride: 105 mmol/L (ref 101–111)
Creatinine, Ser: 1.12 mg/dL (ref 0.61–1.24)
GFR calc non Af Amer: 54 mL/min — ABNORMAL LOW (ref >60)
GLUCOSE: 88 mg/dL (ref 65–99)
POTASSIUM: 3.4 mmol/L — AB (ref 3.5–5.1)
SODIUM: 138 mmol/L (ref 135–145)
TOTAL PROTEIN: 6.3 g/dL — AB (ref 6.5–8.1)

## 2015-11-11 LAB — CBC
HEMATOCRIT: 36.2 % — AB (ref 40.0–52.0)
HEMOGLOBIN: 12.4 g/dL — AB (ref 13.0–18.0)
MCH: 30.3 pg (ref 26.0–34.0)
MCHC: 34.1 g/dL (ref 32.0–36.0)
MCV: 88.9 fL (ref 80.0–100.0)
Platelets: 121 10*3/uL — ABNORMAL LOW (ref 150–440)
RBC: 4.08 MIL/uL — AB (ref 4.40–5.90)
RDW: 17.3 % — ABNORMAL HIGH (ref 11.5–14.5)
WBC: 9.3 10*3/uL (ref 3.8–10.6)

## 2015-11-11 LAB — MRSA PCR SCREENING: MRSA BY PCR: NEGATIVE

## 2015-11-11 MED ORDER — KCL IN DEXTROSE-NACL 20-5-0.9 MEQ/L-%-% IV SOLN
INTRAVENOUS | Status: DC
  Administered 2015-11-11: 11:00:00 via INTRAVENOUS
  Filled 2015-11-11 (×3): qty 1000

## 2015-11-11 MED ORDER — FUROSEMIDE 10 MG/ML IJ SOLN
20.0000 mg | Freq: Once | INTRAMUSCULAR | Status: AC
  Administered 2015-11-11: 20 mg via INTRAVENOUS
  Filled 2015-11-11: qty 2

## 2015-11-11 MED ORDER — IPRATROPIUM-ALBUTEROL 0.5-2.5 (3) MG/3ML IN SOLN
3.0000 mL | RESPIRATORY_TRACT | Status: DC | PRN
  Administered 2015-11-11: 17:00:00 3 mL via RESPIRATORY_TRACT
  Filled 2015-11-11: qty 3

## 2015-11-11 MED ORDER — POTASSIUM CHLORIDE CRYS ER 20 MEQ PO TBCR: 40.0000 meq | EXTENDED_RELEASE_TABLET | Freq: Once | ORAL | Status: DC

## 2015-11-11 NOTE — Progress Notes (Signed)
In and out cath complete, 734ml out, breathing treatment and lasix administered, IV fluids stopped, pt wheezing improved and pt agitation decreased and he is resting more quietly at this time

## 2015-11-11 NOTE — Progress Notes (Signed)
Dr Benjie Karvonen made aware that pt remains somnolent/lethargic likely from haldol given on previous shift, pt remain NPO, fluids stopped/discontinued early morning by previous shift, acknowledge by MD, states that she will place new orders

## 2015-11-11 NOTE — Consult Note (Signed)
[facility] Nurse wound consult note Reason for Consult:Full thickness tissue loss to left anterior LE.  Bilateral heels are intact. Wound type:Suspect trauma Pressure Ulcer POA: No Measurement: 0.6cm x 1.4cm x 0.2cm Wound bed: Pale red, moist Drainage (amount, consistency, odor) small amount serous exudate, no odor Periwound: Intact, macerated Dressing procedure/placement/frequency: I will provide Nursing with guidance via the Orders for twice daily wound care using xeroform gauze, selected both for its antimicrobial and astringent properties. Also provided are bilateral pressure redistribution heel boots as patient is as high risk for skin breakdown. [place] nursing team will not follow, but will remain available to this patient, the nursing and medical teams.  Please re-consult if needed. Thanks, Maudie Flakes, MSN, RN, [place], Arther Abbott  Pager# [phone]

## 2015-11-11 NOTE — Progress Notes (Signed)
[facility] [facility] [place] NAME: Tryon Swiggett    MR#:  [id number]  DATE OF BIRTH:  Jun 30, 1918  SUBJECTIVE:   Patient resting comfortably. Patient was agitated overnight and received Haldol.  REVIEW OF SYSTEMS:    Review of Systems  Unable to perform ROS: Other   Patient is sleeping Tolerating Diet: npo      DRUG ALLERGIES:   Allergies  Allergen Reactions  . Aricept [Donepezil Hydrochloride]     agitation  . Cortisone   . Prednisone     Mania   . Sulfa Antibiotics Other (See Comments)    Listed on nursing home chart    VITALS:  Blood pressure (!) 148/60, pulse 85, temperature 98.1 F (36.7 C), temperature source Oral, resp. rate (!) 22, height 5\' 10"  (1.778 m), weight 72.1 kg (159 lb), SpO2 100 %.  PHYSICAL EXAMINATION:   Physical Exam  Constitutional: He is well-developed, well-nourished, and in no distress. No distress.  HENT:  Head: Normocephalic.  Eyes: Left eye exhibits discharge. No scleral icterus.  Neck: Neck supple. No JVD present. No tracheal deviation present.  Cardiovascular: Normal rate and regular rhythm.  Exam reveals no gallop and no friction rub.   Murmur heard. Pulmonary/Chest: Effort normal. No respiratory distress. He has wheezes. He has no rales. He exhibits no tenderness.  RUL crackles/rhonchii  Abdominal: Soft. Bowel sounds are normal. He exhibits no distension and no mass. There is no tenderness. There is no rebound and no guarding.  Musculoskeletal: He exhibits no edema or deformity.  Neurological:  Sleeping he just received Haldol.   Skin: Skin is warm. No rash noted. He is not diaphoretic. No erythema.  Wearing protective mitts Left shin has small dressing placed  No cellulitis  Psychiatric:  Was agitated overnight      LABORATORY PANEL:   CBC  Recent Labs Lab 11/11/15 0439  WBC 9.3  HGB 12.4*  HCT 36.2*  PLT 121*    ------------------------------------------------------------------------------------------------------------------  Chemistries   Recent Labs Lab 11/11/15 0439  NA 138  K 3.4*  CL 105  CO2 25  GLUCOSE 88  BUN 24*  CREATININE 1.12  CALCIUM 8.2*  AST 25  ALT 12*  ALKPHOS 62  BILITOT 1.3*   ------------------------------------------------------------------------------------------------------------------  Cardiac Enzymes  Recent Labs Lab 11/10/15 0854  TROPONINI <0.03   ------------------------------------------------------------------------------------------------------------------  RADIOLOGY:  Dg Chest Port 1 View  Result Date: 11/10/2015 CLINICAL DATA:  Fever and low oxygen saturations. Decreased alertness. EXAM: PORTABLE CHEST 1 VIEW COMPARISON:  09/07/2014 and 04/25/2014. FINDINGS: Again noted is pleural disease or pleural thickening along the lateral right chest. This may have slightly progressed in the right upper chest region. There is also a stable vague nodular density in the right lower chest. Overall, there are slightly increased densities in the right lung and right lung base. There is a stable chronic calcification in the left lung. Heart size is upper limits of normal but stable. Evidence of prior median sternotomy. Negative for a pneumothorax. IMPRESSION: Chronic pleural and parenchymal densities in the right chest. This may be slight progression of the disease in the right lung which could represent an acute process. Electronically Signed   By: Markus Daft M.D.   On: 11/10/2015 09:33     ASSESSMENT AND PLAN:    82 year old male with chronic kidney disease stage III, colon, bladder and prostate cancer who presents from [facility] [place] with decreased alertness and found to have sepsis due to pneumonia.  1.  Sepsis due to pneumonia: Patient presented with fever and tachycardia  2. HCAP: Continue vancomycin and Zosyn. If MRSA PCR is negative then discontinue  vancomycin Wean oxygen as tolerated Speech evaluation pending  3. Left leg wound: Follow-up on wound culture and wound care consult  4. Essential hypertension: Patient started on metoprolol by admitting physician  5. History of colon, bladder and prostate cancer:   6. Hypokalemia: Replete and recheck in a.m. 7. Dementia: May need to continue use when necessary Haldol for agitation   Rounded with nursing staff CODE STATUS: DO NOT RESUSCITATE  TOTAL TIME TAKING CARE OF THIS PATIENT: 30 minutes.     POSSIBLE D/C 1-2 days, DEPENDING ON CLINICAL CONDITION.   Albertine Lafoy M.D on 11/11/2015 at 7:56 AM  Between 7am to 6pm - Pager - [phone] After 6pm go to [email or web address] - password [email or web address] [place] [facility]  Office  [phone]  CC: Primary care physician; Viviana Simpler, MD  Note: This dictation was prepared with Dragon dictation along with smaller phrase technology. Any transcriptional errors that result from this process are unintentional.

## 2015-11-11 NOTE — Progress Notes (Signed)
Daughter in visiting with pt, updated on pts status, no questions or concerns voiced

## 2015-11-11 NOTE — Progress Notes (Signed)
PT Cancellation Note  Patient Details Name: Dustin Byrd MRN: [id number] DOB: 21-Aug-1918   Cancelled Treatment:    Reason Eval/Treat Not Completed: Patient's level of consciousness (Nursing hold due to haldol and confusion).  Will try later if pt is more clear and able to follow instructions.   Ramond Dial 11/11/2015, 11:44 AM    Mee Hives, PT MS Acute Rehab Dept. Number: [place] [facility] [place]

## 2015-11-11 NOTE — Care Management Important Message (Signed)
Important Message  Patient Details  Name: Dustin Byrd MRN: [id number] Date of Birth: July 05, 1918   Medicare Important Message Given:  Yes    Tayler Lassen A, RN 11/11/2015, 11:02 AM

## 2015-11-11 NOTE — Progress Notes (Signed)
Dr Benjie Karvonen made aware that pt with increase in wheezing, also grabbing toward his bladder, bladder scan done and noted 563ml in bladder, new order for IV lasix 20mg  once, duonebs Q4hours PRN added, stop IV fluids, in and out cath x1

## 2015-11-12 ENCOUNTER — Telehealth: Payer: Self-pay

## 2015-11-12 DIAGNOSIS — Z7189 Other specified counseling: Secondary | ICD-10-CM

## 2015-11-12 DIAGNOSIS — Z515 Encounter for palliative care: Secondary | ICD-10-CM

## 2015-11-12 DIAGNOSIS — F0391 Unspecified dementia with behavioral disturbance: Secondary | ICD-10-CM

## 2015-11-12 DIAGNOSIS — J189 Pneumonia, unspecified organism: Secondary | ICD-10-CM

## 2015-11-12 LAB — BASIC METABOLIC PANEL
Anion gap: 12 (ref 5–15)
BUN: 27 mg/dL — AB (ref 6–20)
CHLORIDE: 101 mmol/L (ref 101–111)
CO2: 26 mmol/L (ref 22–32)
Calcium: 8.5 mg/dL — ABNORMAL LOW (ref 8.9–10.3)
Creatinine, Ser: 1.57 mg/dL — ABNORMAL HIGH (ref 0.61–1.24)
GFR calc Af Amer: 41 mL/min — ABNORMAL LOW (ref >60)
GFR calc non Af Amer: 36 mL/min — ABNORMAL LOW (ref >60)
GLUCOSE: 76 mg/dL (ref 65–99)
POTASSIUM: 3.5 mmol/L (ref 3.5–5.1)
Sodium: 139 mmol/L (ref 135–145)

## 2015-11-12 LAB — URINE CULTURE: Culture: 70000 — AB

## 2015-11-12 MED ORDER — COLCHICINE 0.6 MG PO TABS
0.6000 mg | ORAL_TABLET | Freq: Every day | ORAL | Status: DC
  Administered 2015-11-13: 08:00:00 0.6 mg via ORAL
  Filled 2015-11-12 (×2): qty 1

## 2015-11-12 MED ORDER — MINERAL OIL RE ENEM: 1.0000 | ENEMA | RECTAL | Status: DC

## 2015-11-12 MED ORDER — AZITHROMYCIN 500 MG PO TABS
500.0000 mg | ORAL_TABLET | Freq: Every day | ORAL | Status: AC
  Administered 2015-11-12: 14:00:00 500 mg via ORAL
  Filled 2015-11-12: qty 1

## 2015-11-12 MED ORDER — HALOPERIDOL LACTATE 5 MG/ML IJ SOLN
1.0000 mg | Freq: Three times a day (TID) | INTRAMUSCULAR | Status: DC | PRN
  Administered 2015-11-13: 01:00:00 1 mg via INTRAVENOUS
  Filled 2015-11-12: qty 1

## 2015-11-12 MED ORDER — ENOXAPARIN SODIUM 30 MG/0.3ML ~~LOC~~ SOLN
30.0000 mg | SUBCUTANEOUS | Status: DC
  Administered 2015-11-12: 20:00:00 30 mg via SUBCUTANEOUS
  Filled 2015-11-12: qty 0.3

## 2015-11-12 MED ORDER — PANTOPRAZOLE SODIUM 40 MG PO TBEC
40.0000 mg | DELAYED_RELEASE_TABLET | Freq: Every day | ORAL | Status: DC
  Administered 2015-11-13: 08:00:00 40 mg via ORAL
  Filled 2015-11-12: qty 1

## 2015-11-12 MED ORDER — MESALAMINE ER 250 MG PO CPCR
250.0000 mg | ORAL_CAPSULE | Freq: Four times a day (QID) | ORAL | Status: DC
  Administered 2015-11-12: 250 mg via ORAL
  Filled 2015-11-12 (×5): qty 1

## 2015-11-12 MED ORDER — DEXTROSE 5 % IV SOLN
1.0000 g | Freq: Once | INTRAVENOUS | Status: DC
  Filled 2015-11-12: qty 10

## 2015-11-12 MED ORDER — CEFUROXIME AXETIL 250 MG PO TABS
500.0000 mg | ORAL_TABLET | Freq: Every day | ORAL | Status: DC
  Administered 2015-11-13: 500 mg via ORAL
  Filled 2015-11-12: qty 2

## 2015-11-12 MED ORDER — ALLOPURINOL 100 MG PO TABS
100.0000 mg | ORAL_TABLET | Freq: Every day | ORAL | Status: DC
  Administered 2015-11-13: 08:00:00 100 mg via ORAL
  Filled 2015-11-12: qty 1

## 2015-11-12 MED ORDER — TAMSULOSIN HCL 0.4 MG PO CAPS
0.4000 mg | ORAL_CAPSULE | Freq: Every day | ORAL | Status: DC
  Administered 2015-11-13: 0.4 mg via ORAL
  Filled 2015-11-12: qty 1

## 2015-11-12 MED ORDER — AMLODIPINE BESYLATE 5 MG PO TABS
5.0000 mg | ORAL_TABLET | Freq: Every day | ORAL | Status: DC
  Administered 2015-11-13: 5 mg via ORAL
  Filled 2015-11-12: qty 1

## 2015-11-12 MED ORDER — LORAZEPAM 0.5 MG PO TABS: 0.2500 mg | ORAL_TABLET | Freq: Three times a day (TID) | ORAL | Status: DC | PRN

## 2015-11-12 MED ORDER — CEFTRIAXONE SODIUM-DEXTROSE 1-3.74 GM-% IV SOLR
1.0000 g | Freq: Once | INTRAVENOUS | Status: AC
  Administered 2015-11-12: 10:00:00 1 g via INTRAVENOUS
  Filled 2015-11-12: qty 50

## 2015-11-12 MED ORDER — AZITHROMYCIN 250 MG PO TABS
250.0000 mg | ORAL_TABLET | Freq: Every day | ORAL | Status: DC
  Administered 2015-11-13: 08:00:00 250 mg via ORAL
  Filled 2015-11-12: qty 1

## 2015-11-12 MED ORDER — IPRATROPIUM-ALBUTEROL 0.5-2.5 (3) MG/3ML IN SOLN
3.0000 mL | Freq: Three times a day (TID) | RESPIRATORY_TRACT | Status: DC
  Administered 2015-11-13: 3 mL via RESPIRATORY_TRACT
  Filled 2015-11-12: qty 3

## 2015-11-12 MED ORDER — CHLORHEXIDINE GLUCONATE 0.12 % MT SOLN
15.0000 mL | Freq: Two times a day (BID) | OROMUCOSAL | Status: DC
  Administered 2015-11-12 – 2015-11-13 (×2): 15 mL via OROMUCOSAL
  Filled 2015-11-12 (×2): qty 15

## 2015-11-12 MED ORDER — TRAMADOL HCL 50 MG PO TABS: 25.0000 mg | ORAL_TABLET | Freq: Three times a day (TID) | ORAL | Status: DC | PRN

## 2015-11-12 MED ORDER — FINASTERIDE 5 MG PO TABS
5.0000 mg | ORAL_TABLET | Freq: Every day | ORAL | Status: DC
  Administered 2015-11-13: 5 mg via ORAL
  Filled 2015-11-12: qty 1

## 2015-11-12 MED ORDER — ORAL CARE MOUTH RINSE: 15.0000 mL | Freq: Two times a day (BID) | OROMUCOSAL | Status: DC

## 2015-11-12 MED ORDER — FLUDROCORTISONE ACETATE 0.1 MG PO TABS
0.1000 mg | ORAL_TABLET | ORAL | Status: DC
  Administered 2015-11-13: 07:00:00 0.1 mg via ORAL
  Filled 2015-11-12 (×3): qty 1

## 2015-11-12 MED ORDER — ASPIRIN 81 MG PO CHEW
81.0000 mg | CHEWABLE_TABLET | Freq: Every day | ORAL | Status: DC
  Administered 2015-11-13: 81 mg via ORAL
  Filled 2015-11-12: qty 1

## 2015-11-12 MED ORDER — CITALOPRAM HYDROBROMIDE 20 MG PO TABS
20.0000 mg | ORAL_TABLET | ORAL | Status: DC
  Administered 2015-11-13: 07:00:00 20 mg via ORAL
  Filled 2015-11-12: qty 1

## 2015-11-12 NOTE — Care Management (Signed)
Admitted to [facility] with the diagnosis of sepsis. Daughter is Dustin Byrd [phone]). Joint replacement 09/2014. Discharged from this facility back to [facility] at that time. Dr. Silvio Pate is Mr. Byrd primary care physician. Palliative Care referral in progress. Shelbie Ammons RN MSN CCM Care Management [phone]

## 2015-11-12 NOTE — Telephone Encounter (Signed)
PLEASE NOTE: All timestamps contained within this report are represented as [place] Standard Time. CONFIDENTIALTY NOTICE: This fax transmission is intended only for the addressee. It contains information that is legally privileged, confidential or otherwise protected from use or disclosure. If you are not the intended recipient, you are strictly prohibited from reviewing, disclosing, copying using or disseminating any of this information or taking any action in reliance on or regarding this information. If you have received this fax in error, please notify us immediately by telephone so that we can arrange for its return to [place]. Phone: [phone], Toll-Free: [phone], Fax: [phone] Page: 1 of 1 Call Id: [id number] [place] Night - Client Nonclinical Telephone Record [place] Night - Client Client Site [place] Physician Viviana Simpler - MD Contact Type Call Who Is Calling Physician / Provider / Hospital Call Type Provider Call [facility] Page Now Reason for Call Request to speak to Physician Initial Comment Lelan Pons with [facility] needs to speak with the on call about a patient. Additional Comment Patient Name Dustin Byrd Patient DOB 09-20-1918 Requesting Provider Lelan Pons Physician Number [phone] Facility Name Paging DoctorName Phone DateTime Result/Outcome Message Type Notes Renford Dills - MD [id number] 11/10/2015 6:14:44 AM Called On Call Provider - Reached Doctor Paged Renford Dills - MD 11/10/2015 6:14:58 AM Spoke with On Call - General Message Result Call Closed By: Zannie Kehr Transaction Date/Time: 11/10/2015 6:10:40 AM (ET)

## 2015-11-12 NOTE — Evaluation (Signed)
Clinical/Bedside Swallow Evaluation Patient Details  Name: Dustin Byrd MRN: [id number] Date of Birth: May 23, 1918  Today's Date: 11/12/2015 Time: SLP Start Time (ACUTE ONLY): 1330 SLP Stop Time (ACUTE ONLY): 1430 SLP Time Calculation (min) (ACUTE ONLY): 60 min  Past Medical History:  Past Medical History:  Diagnosis Date  . Anxiety   . Asbestos exposure   . Benign hypertensive kidney disease with chronic kidney disease stage I through stage IV, or unspecified(403.10)   . CAD (coronary artery disease)   . Chronic kidney disease, stage III (moderate)   . Colon cancer ([place]) 1994  . Diverticulosis of colon (without mention of hemorrhage)   . DJD of shoulder   . Encounter for long-term (current) use of other medications   . Esophageal stricture   . Gout   . Hypercholesterolemia   . Memory loss   . Orthostatic hypotension   . Personal history of colonic polyps 1991   villous adenoma of cecum  . UC (ulcerative colitis) [facility])    Past Surgical History:  Past Surgical History:  Procedure Laterality Date  . COLOSTOMY TAKEDOWN  1996  . CORONARY ARTERY BYPASS GRAFT     x4  . HEMICOLECTOMY  1991   right  . HIP ARTHROPLASTY Right 09/05/2014   Procedure: ARTHROPLASTY BIPOLAR HIP (HEMIARTHROPLASTY);  Surgeon: Corky Mull, MD;  Location: [facility] ORS;  Service: Orthopedics;  Laterality: Right;  . open heart surgery     CABG x4   HPI:  Pt is a 82 y/o male w/ h/o memory loss/Cognitive decline, Esophageal stricture, coronary artery disease, chronic kidney disease stage III, colon cancer, bladder cancer, prostate cancer, essential hypertension multiple other medical problems has been having cough from yesterday and today he had temp of 101. Granddaughter has brought him into the emergency department . Chest x-ray with the acute process in the right  Lung. Patient is somewhat lethargic during my examination but arousable. Unsure of pt's baseline Cognitive status but he is able to generally answer  basic questions and follow basic commands w/ cues. He keeps his eyes closed mostly at baseline; visually impaired.    Assessment / Plan / Recommendation Clinical Impression  Pt appeared to present w/ increased risk for aspiration secondary to his declined alertness and Cognitive status impacting his oral phase of swallowing (bolus management, awareness for taking po's from utensil) then completion of swallowing in a timely manner. Given time and verbal/tactile cues, pt was able to adequately tolerate trials of Honey consistency liquids and puree via TSP amounts - w/ the verbal/tactile cues and aspiration precautions followed.     Aspiration Risk  Mild aspiration risk (-moderate aspiration risk)    Diet Recommendation  Dysphagia 1 w/ Honey consistency liquids via TSP; aspiration precautions; feeding support at all meals - verbal/tactile cues   Medication Administration: Crushed with puree    Other  Recommendations Recommended Consults:  (Dietician; Palliative Consults) Oral Care Recommendations: Oral care BID;Staff/trained caregiver to provide oral care Other Recommendations: Order thickener from pharmacy;Prohibited food (jello, ice cream, thin soups);Remove water pitcher;Have oral suction available   Follow up Recommendations Skilled Nursing facility (TBD)      Frequency and Duration min 3x week  2 weeks       Prognosis Prognosis for Safe Diet Advancement: Fair Barriers to Reach Goals: Cognitive deficits      Swallow Study   General Date of Onset: 11/10/15 HPI: Pt is a 82 y/o male w/ h/o memory loss/Cognitive decline, Esophageal stricture, coronary artery disease, chronic kidney  disease stage III, colon cancer, bladder cancer, prostate cancer, essential hypertension multiple other medical problems has been having cough from yesterday and today he had temp of 101. Granddaughter has brought him into the emergency department . Chest x-ray with the acute process in the right  Lung.  Patient is somewhat lethargic during my examination but arousable. Unsure of pt's baseline Cognitive status but he is able to generally answer basic questions and follow basic commands w/ cues. He keeps his eyes closed mostly at baseline; visually impaired.  Type of Study: Bedside Swallow Evaluation Previous Swallow Assessment: had an esophageal stricture Diet Prior to this Study: Dysphagia 2 (chopped);Thin liquids (per Daughter's report - fed self prior she thought) Temperature Spikes Noted: No (wbc 9.3) Respiratory Status: Nasal cannula (2-3 liters) History of Recent Intubation: No Behavior/Cognition: Alert;Cooperative;Pleasant mood;Confused;Distractible;Requires cueing (drowsy) Oral Cavity Assessment: Dry;Dried secretions Oral Care Completed by SLP: Yes Oral Cavity - Dentition: Poor condition (of the upper dentition; no lower dentition) Vision:  (TBD) Self-Feeding Abilities: Total assist Patient Positioning: Upright in bed Baseline Vocal Quality: Low vocal intensity Volitional Cough: Cognitively unable to elicit Volitional Swallow: Unable to elicit    Oral/Motor/Sensory Function Overall Oral Motor/Sensory Function: Mild impairment (overall weakness - open mouth posture) Facial Symmetry: Within Functional Limits Lingual ROM:  (reduced overall) Lingual Symmetry: Within Functional Limits Lingual Strength: Within Functional Limits   Ice Chips Ice chips: Impaired Presentation: Spoon (x2 trials) Oral Phase Impairments: Reduced labial seal;Reduced lingual movement/coordination;Poor awareness of bolus Oral Phase Functional Implications: Prolonged oral transit;Oral holding Pharyngeal Phase Impairments: Suspected delayed Swallow Other Comments: no overt coughing noted   Thin Liquid Thin Liquid: Not tested    Nectar Thick Nectar Thick Liquid: Not tested   Honey Thick Honey Thick Liquid: Impaired Presentation: Spoon (fed; 10 trials) Oral Phase Impairments: Reduced labial seal;Reduced lingual  movement/coordination;Poor awareness of bolus Oral Phase Functional Implications: Prolonged oral transit;Oral holding Pharyngeal Phase Impairments:  (none)   Puree Puree: Impaired Presentation: Spoon (fed; 10 trials) Oral Phase Impairments: Reduced labial seal;Reduced lingual movement/coordination;Poor awareness of bolus Oral Phase Functional Implications: Prolonged oral transit;Oral holding Pharyngeal Phase Impairments:  (none)   Solid   GO   Solid: Not tested         Orinda Kenner, MS, CCC-SLP  Watson,Katherine 11/12/2015,2:38 PM

## 2015-11-12 NOTE — Telephone Encounter (Signed)
Was admitted to hospital Called daughter and left message

## 2015-11-12 NOTE — Progress Notes (Signed)
Chaplain was making his rounds and visited with pt in room 110. Provided the ministry of prayer and a pastoral presence.    11/12/15 1315  Clinical Encounter Type  Visited With Patient  Visit Type Initial;Spiritual support  Referral From Nurse  Spiritual Encounters  Spiritual Needs Prayer

## 2015-11-12 NOTE — Progress Notes (Addendum)
[place] [facility] [place] NAME: Dustin Byrd    MR#:  [id number]  DATE OF BIRTH:  February 15, 1918  SUBJECTIVE:   lethargic REVIEW OF SYSTEMS:   Review of Systems  Constitutional: Negative for chills, fever and weight loss.  HENT: Negative for ear discharge, ear pain and nosebleeds.   Eyes: Negative for blurred vision, pain and discharge.  Respiratory: Negative for sputum production, shortness of breath, wheezing and stridor.   Cardiovascular: Negative for chest pain, palpitations, orthopnea and PND.  Gastrointestinal: Negative for abdominal pain, diarrhea, nausea and vomiting.  Genitourinary: Negative for frequency and urgency.  Musculoskeletal: Negative for back pain and joint pain.  Neurological: Positive for weakness. Negative for sensory change, speech change and focal weakness.  Psychiatric/Behavioral: Negative for depression and hallucinations. The patient is not nervous/anxious.    Tolerating Diet:some Tolerating PT: SNF  DRUG ALLERGIES:   Allergies  Allergen Reactions  . Aricept [Donepezil Hydrochloride]     agitation  . Cortisone   . Prednisone     Mania   . Sulfa Antibiotics Other (See Comments)    Listed on nursing home chart    VITALS:  Blood pressure 112/61, pulse 85, temperature 98.9 F (37.2 C), resp. rate 18, height 5\' 10"  (1.778 m), weight 72.1 kg (159 lb), SpO2 100 %.  PHYSICAL EXAMINATION:   Physical Exam  GENERAL:  82 y.o.-year-old patient lying in the bed with no acute distress.  EYES: Pupils equal, round, reactive to light and accommodation. No scleral icterus. Extraocular muscles intact.  HEENT: Head atraumatic, normocephalic. Oropharynx and nasopharynx clear.  NECK:  Supple, no jugular venous distention. No thyroid enlargement, no tenderness.  LUNGS: Normal breath sounds bilaterally, no wheezing, rales, rhonchi. No use of accessory muscles of respiration.  CARDIOVASCULAR: S1, S2 normal. No murmurs,  rubs, or gallops.  ABDOMEN: Soft, nontender, nondistended. Bowel sounds present. No organomegaly or mass.  EXTREMITIES: No cyanosis, clubbing or edema b/l.   Leg wound dressing+ NEUROLOGIC: grossly non focal PSYCHIATRIC: sleepy SKIN: No obvious rash, lesion, or ulcer.   LABORATORY PANEL:  CBC  Recent Labs Lab 11/11/15 0439  WBC 9.3  HGB 12.4*  HCT 36.2*  PLT 121*    Chemistries   Recent Labs Lab 11/11/15 0439 11/12/15 0501  NA 138 139  K 3.4* 3.5  CL 105 101  CO2 25 26  GLUCOSE 88 76  BUN 24* 27*  CREATININE 1.12 1.57*  CALCIUM 8.2* 8.5*  AST 25  --   ALT 12*  --   ALKPHOS 62  --   BILITOT 1.3*  --    Cardiac Enzymes  Recent Labs Lab 11/10/15 0854  TROPONINI <23.70   82 year old male with chronic kidney disease stage III, colon, bladder and prostate cancer who presents from [facility] [place] with decreased alertness and found to have sepsis due to pneumonia.  1. Sepsis due to pneumonia: Patient presented with fever and tachycardia  2. HCAP: Continue vancomycin and Zosyn---change to rocephin and zithromax MRSA PCR negtive---d/c vanc Wean oxygen as tolerated Speech evaluation recommendations noted  3. Left leg wound:  -Follow-up on wound culture shows few WBC, NO organisms. Cont wound care.  4. Essential hypertension: -started on low dose amlodipine  5. History of colon, bladder and prostate cancer:   6. Hypokalemia: Repleted  7. Dementia: May need to continue use when necessary Haldol for agitation  Pt is DNR  Case discussed with Care Management/Social Worker. Management plans discussed with the patient, family and  they are in agreement.  CODE STATUS: DNR DVT Prophylaxis:lovenox TOTAL TIME TAKING CARE OF THIS PATIENT: 76minutes.  >50% time spent on counselling and coordination of care  POSSIBLE D/C IN 1-2 DAYS, DEPENDING ON CLINICAL CONDITION.  Note: This dictation was prepared with Dragon dictation along with smaller phrase technology. Any  transcriptional errors that result from this process are unintentional.  Dustin Byrd M.D on 11/12/2015 at 4:46 PM  Between 7am to 6pm - Pager - [phone]  After 6pm go to [email or web address] - password [email or web address] [facility]  Office  [phone]  CC: Primary care physician; Viviana Simpler, MD

## 2015-11-12 NOTE — Consult Note (Signed)
Consultation Note Date: 11/13/2015   Patient Name: Dustin Byrd  DOB: 1918-10-23  MRN: [id number]  Age / Sex: 82 y.o., male  PCP: Venia Carbon, MD Referring Physician: Fritzi Mandes, MD  Reason for Consultation: Establishing goals of care  HPI/Patient Profile: 82 y.o. male with past medical history of dementia, coronary artery disease, chronic kidney disease stage III, colon cancer, bladder cancer, prostate cancer, essential hypertension, diverticulosis, ulcerative colitis, gout and anxiety admitted on 11/10/2015 from nursing facility with cough and fever. Patient was septic due to HCAP-on rocephin and zithromax. He has a left leg wound-wound care consulted and order placed for BID dressing changes. Palliative medicine consultation for goals of care.   Clinical Assessment and Goals of Care: Met with daughter and POA, Gay Filler. Introduced palliative care and the support we will provide while he is hospitalized. Gay Filler tells me he has had dementia for many years (at least 1) and has lived at [facility] for about [facility]-6 years. Baseline, he requires two assist to the chair/wheelchair. He cannot walk on his own. He spends most of his days in a wheelchair, but able to wheel himself around. He feeds himself but always the last one done eating because he is scared he will choke. She tells me he does have a lot of anxiety but takes Ativan as needed, which seems to help and helps him rest at night. Gay Filler tells me she feels he has a good quality of life at [facility] He enjoys the company of the nurses.   Discussed the natural trajectory of dementia which she is familiar with. Discussed that this may be his new baseline. She tells me she is not afraid for him to die and he has been making comments lately about dying. She feels like his body is telling him he is going to die soon. Her biggest concern is for him to be comfortable and  not suffer. She does not want him to keep coming back to the hospital unless he is uncomfortable. MOST form completed and placed in chart.   Discussed in length hospice services which could be another layer of support for him at the nursing facility. She agrees with hospice involvement. Hard choices copy given. Questions answered and emotional support given.   HCPOA-daughter Gay Filler)   SUMMARY OF RECOMMENDATIONS    Discussed the natural trajectory of dementia and this decline may be a new baseline. She understands and wants her father to be comfortable and not hospitalized unless absolutely necessary.   MOST form completed.   Discussed hospice. Daughter agrees with hospice services at nursing facility. Social work consult placed.  PMT will continue to follow patient during hospitalization and offer recommendations as needed.   Code Status/Advance Care Planning:  DNR   Symptom Management:   Per attending  Palliative Prophylaxis:   Aspiration, Delirium Protocol, Frequent Pain Assessment, Oral Care, Palliative Wound Care and Turn Reposition  Psycho-social/Spiritual:   Desire for further Chaplaincy support:no  Additional Recommendations: Caregiving  Support/Resources and Education on Hospice  Prognosis:   < 6 months Guarded-could be significantly less if functional and nutritional status continue to decline.   Discharge Planning: [place] with Hospice      Primary Diagnoses: Present on Admission: . Sepsis ([place])   I have reviewed the medical record, interviewed the patient and family, and examined the patient. The following aspects are pertinent.  Past Medical History:  Diagnosis Date  . Anxiety   . Asbestos exposure   . Benign hypertensive kidney disease with chronic kidney disease stage I through stage IV, or unspecified(403.10)   . CAD (coronary artery disease)   . Chronic kidney disease, stage III (moderate)   . Colon cancer ([place]) 1994  .  Diverticulosis of colon (without mention of hemorrhage)   . DJD of shoulder   . Encounter for long-term (current) use of other medications   . Esophageal stricture   . Gout   . Hypercholesterolemia   . Memory loss   . Orthostatic hypotension   . Personal history of colonic polyps 1991   villous adenoma of cecum  . UC (ulcerative colitis) [facility])    Social History   Social History  . Marital status: Widowed    Spouse name: N/A  . Number of children: 2  . Years of education: N/A   Occupational History  . Textiles--various positions     Retired   Social History Main Topics  . Smoking status: Former Research scientist (life sciences)  . Smokeless tobacco: Never Used  . Alcohol use Yes     Comment: 1-2 oz Scotch per night  . Drug use: No  . Sexual activity: Not Asked   Other Topics Concern  . None   Social History Narrative   2 daughters      No living will   Daughter Gay Filler has health care POA   Discussed DNR---he requests this ("I want to go in peace")   No tube feedings if cognitively unaware   Family History  Problem Relation Age of Onset  . Diabetes Father   . Bipolar disorder Daughter   . Stroke Mother    Scheduled Meds: . acetaminophen  650 mg Oral Once  . allopurinol  100 mg Oral Daily  . amLODipine  5 mg Oral Daily  . aspirin  81 mg Oral Daily  . azithromycin  250 mg Oral Daily  . cefUROXime  500 mg Oral Daily  . chlorhexidine  15 mL Mouth Rinse BID  . citalopram  20 mg Oral BH-q7a  . colchicine  0.6 mg Oral Daily  . enoxaparin (LOVENOX) injection  30 mg Subcutaneous Q24H  . finasteride  5 mg Oral Daily  . fludrocortisone  0.1 mg Oral BH-q7a  . fluticasone  2 spray Each Nare QPM  . ipratropium-albuterol  3 mL Nebulization TID  . mouth rinse  15 mL Mouth Rinse q12n4p  . mesalamine  250 mg Oral QID  . mineral oil  1 enema Rectal Weekly  . pantoprazole  40 mg Oral Daily  . tamsulosin  0.4 mg Oral Daily   Continuous Infusions: PRN Meds:.acetaminophen **OR** acetaminophen,  haloperidol lactate, ipratropium-albuterol, LORazepam, nitroGLYCERIN, ondansetron **OR** ondansetron (ZOFRAN) IV, traMADol Medications Prior to Admission:  Prior to Admission medications   Medication Sig Start Date End Date Taking? Authorizing Provider  acetaminophen (TYLENOL) 325 MG tablet Take 650 mg by mouth once as needed for mild pain or fever.    Yes Historical Provider, MD  allopurinol (ZYLOPRIM) 100 MG tablet TAKE 1 TABLET BY MOUTH ONCE DAILY FOR GOUT  11/07/13  Yes Venia Carbon, MD  aspirin 81 MG chewable tablet TAKE 1 TABLET BY MOUTH EACH DAY. ANTI PLATELET AGGREGATION 11/07/13  Yes Venia Carbon, MD  citalopram (CELEXA) 20 MG tablet Take 20 mg by mouth every morning.   Yes Historical Provider, MD  COLCRYS 0.6 MG tablet TAKE 1 TABLET BY MOUTH ONCE DAILY FOR GOUT 07/29/12  Yes Venia Carbon, MD  finasteride (PROSCAR) 5 MG tablet Take 1 tablet (5 mg total) by mouth daily. 09/08/14  Yes Theodoro Grist, MD  fludrocortisone (FLORINEF) 0.1 MG tablet Take 0.1 mg by mouth every morning.    Yes Historical Provider, MD  fluticasone (FLONASE) 50 MCG/ACT nasal spray Place 2 sprays into both nostrils every evening.    Yes Historical Provider, MD  ipratropium-albuterol (DUONEB) 0.5-2.5 (3) MG/3ML SOLN Take 3 mLs by nebulization every 4 (four) hours as needed.   Yes Historical Provider, MD  loratadine (CLARITIN) 10 MG tablet Take 1 tab BID x 5 days then daily prn thereafter 12/21/13  Yes Jearld Fenton, NP  LORazepam (ATIVAN) 0.5 MG tablet Take 0.5 tablets (0.25 mg total) by mouth every 8 (eight) hours as needed for anxiety. 1/2 tab up to 3 times daily as needed for anxiety 09/08/14  Yes Theodoro Grist, MD  mineral oil enema Place 1 enema rectally once a week. Once a week as needed for constipation.   Yes Historical Provider, MD  nitroGLYCERIN (NITROSTAT) 0.4 MG SL tablet Place 1 tablet (0.4 mg total) under the tongue every 5 (five) minutes as needed. For chest pain. Call 911 after 3 doses if no relief.  08/05/12  Yes Venia Carbon, MD  Nutritional Supplements (ARGINAID EXTRA PO) Take 1 Dose by mouth 2 (two) times daily.   Yes Historical Provider, MD  omeprazole (PRILOSEC) 20 MG capsule Take 20 mg by mouth daily.   Yes Historical Provider, MD  PENTASA 250 MG CR capsule TAKE 1 CAPSULE BY MOUTH FOUR TIMES A DAY FOR ULCERATIVE COLITIS. 08/05/12  Yes Venia Carbon, MD  polyethylene glycol (MIRALAX / GLYCOLAX) packet Take 17 g by mouth 2 (two) times daily. Patient taking differently: Take 17 g by mouth at bedtime.  10/19/12  Yes Venia Carbon, MD  sennosides-docusate sodium (SENOKOT-S) 8.6-50 MG tablet TAKE 2 TABLETS BY MOUTH TWICE A DAY FOR STOOL SOFTENER. TAKE WITH GLASS OF WATER. 08/12/12  Yes Venia Carbon, MD  tamsulosin (FLOMAX) 0.4 MG CAPS capsule Take 1 capsule (0.4 mg total) by mouth daily. 09/08/14  Yes Theodoro Grist, MD  traMADol (ULTRAM) 50 MG tablet Take 25 mg by mouth 3 (three) times daily as needed.   Yes Historical Provider, MD  Dermatological Products, [place]. (ITCH-ENDER!) LOTN APPLY TO ULCER AREA 3 TIMES A DAY AS DIRECTED. Patient not taking: Reported on 11/10/2015 06/17/12   Venia Carbon, MD  enoxaparin (LOVENOX) 40 MG/0.4ML injection Inject 0.4 mLs (40 mg total) into the skin daily. Patient not taking: Reported on 11/10/2015 09/08/14   Lattie Corns, PA-C  LORazepam (ATIVAN) 0.5 MG tablet Take 0.5 tablets (0.25 mg total) by mouth every morning. Patient not taking: Reported on 11/10/2015 09/08/14   Theodoro Grist, MD  oxyCODONE (OXY IR/ROXICODONE) 5 MG immediate release tablet Take 1-2 tablets (5-10 mg total) by mouth every 4 (four) hours as needed for breakthrough pain. Patient not taking: Reported on 11/10/2015 09/08/14   Lattie Corns, PA-C  SILVADENE 1 % cream APPLY EVERY DAY TO ULCER UNTIL HEALED Patient not taking: Reported on  11/10/2015 12/07/13   Venia Carbon, MD   Allergies  Allergen Reactions  . Aricept [Donepezil Hydrochloride]     agitation  . Cortisone   .  Prednisone     Mania   . Sulfa Antibiotics Other (See Comments)    Listed on nursing home chart   Review of Systems  Unable to perform ROS: Dementia    Physical Exam  Constitutional: He is easily aroused. He appears ill.  Cardiovascular: Regular rhythm and normal heart sounds.   Pulmonary/Chest: Effort normal. He has decreased breath sounds.  Abdominal: Soft. Bowel sounds are normal. He exhibits no distension. There is no tenderness.  Neurological: He is easily aroused. He is disoriented.  Skin: Skin is warm and dry.  Wound LLE. Dressing C/D/I  Psychiatric: His speech is delayed. Cognition and memory are impaired. He is inattentive.  Nursing note and vitals reviewed.   Vital Signs: BP (!) 174/84 (BP Location: Right Arm)   Pulse 79   Temp 98.6 F (37 C) (Oral)   Resp 19   Ht 5' 10" (1.778 m)   Wt 72.1 kg (159 lb)   SpO2 97%   BMI 22.81 kg/m  Pain Assessment: No/denies pain POSS *See Group Information*: 2-Acceptable,Slightly drowsy, easily aroused Pain Score: 0-No pain   SpO2: SpO2: 97 % O2 Device:SpO2: 97 % O2 Flow Rate: .O2 Flow Rate (L/min): 1 L/min  IO: Intake/output summary:   Intake/Output Summary (Last 24 hours) at 11/13/15 0756 Last data filed at 11/12/15 1009  Gross per 24 hour  Intake                0 ml  Output              720 ml  Net             -720 ml    LBM: Last BM Date: 11/12/15 Baseline Weight: Weight: 72.1 kg (159 lb) Most recent weight: Weight: 72.1 kg (159 lb)     Palliative Assessment/Data: PPS 30%   Flowsheet Rows   Flowsheet Row Most Recent Value  Intake Tab  Referral Department  Hospitalist  Unit at Time of Referral  Oncology Unit  Palliative Care Primary Diagnosis  Sepsis/Infectious Disease  Date Notified  11/10/15  Palliative Care Type  New Palliative care  Reason for referral  Clarify Goals of Care  Date of Admission  11/10/15  # of days IP prior to Palliative referral  0  Clinical Assessment  Palliative Performance  Scale Score  30%  Psychosocial & Spiritual Assessment  Palliative Care Outcomes  Patient/Family meeting held?  Yes  Who was at the meeting?  daughter-Sally  Palliative Care Outcomes  Clarified goals of care, Counseled regarding hospice, Provided end of life care assistance, Provided psychosocial or spiritual support, Provided advance care planning      Time In: 1330 Time Out: 1440 Time Total: 44mn Greater than 50%  of this time was spent counseling and coordinating care related to the above assessment and plan.  Signed by:  MIhor Dow FNP-C Palliative Medicine Team  Phone: [phone]: [phone]  Please contact Palliative Medicine Team phone at [phone] questions and concerns.  For individual provider: See AShea Evans

## 2015-11-12 NOTE — Plan of Care (Signed)
Problem: Education: Goal: Knowledge of  General Education information/materials will improve Outcome: Not Progressing Dementia

## 2015-11-12 NOTE — Progress Notes (Signed)
PT Cancellation Note  Patient Details Name: Dustin Byrd MRN: [id number] DOB: 1918-02-27   Cancelled Treatment:    Reason Eval/Treat Not Completed: Other (comment). Eval re-attempted, however per RN, pt just received haldol prior to shift change and is very out of it at this time. Not a good time for therapy evaluation. Will re-attempt at another time.   Enis Riecke 11/12/2015, 10:09 AM Greggory Stallion, PT, DPT [phone]

## 2015-11-12 NOTE — Plan of Care (Signed)
Problem: Safety: Goal: Ability to remain free from injury will improve Outcome: Progressing No injuries

## 2015-11-12 NOTE — Telephone Encounter (Signed)
Per chart review tab pt was admitted 11/10/15 to [facility].

## 2015-11-12 NOTE — NC FL2 (Addendum)
[place] LEVEL OF CARE SCREENING TOOL     IDENTIFICATION  Patient Name: Dustin Byrd Birthdate: 1918-11-07 Sex: male Admission Date (Current Location): 11/10/2015  [facility] and [place] Number:  Engineering geologist and Address:  [facility], [address] [place], [place] [address]      Provider Number: [id number]  Attending Physician Name and Address:  Fritzi Mandes, MD  Relative Name and Phone Number:       Current Level of Care: Hospital Recommended Level of Care: [place] Prior Approval Number:    Date Approved/Denied:   PASRR Number:    Discharge Plan: SNF    Current Diagnoses: Patient Active Problem List   Diagnosis Date Noted  . Sepsis ([place]) 11/10/2015  . Acute urinary retention 09/08/2014  . CAP (community acquired pneumonia) 09/08/2014  . Fever presenting with conditions classified elsewhere 09/08/2014  . Ulcerative colitis ([place]) 09/08/2014  . GERD (gastroesophageal reflux disease) 09/08/2014  . Alzheimer's dementia 09/08/2014  . Fractured femoral neck ([place]) 09/04/2014  . Orthostatic hypotension 09/29/2013  . Irritant dermatitis 12/23/2012  . Dementia, unspecified, without behavioral disturbance 05/01/2011  . Constipation, chronic 05/01/2011  . Gout, unspecified 05/01/2011  . Episodic mood disorder ([place])   . COLON CANCER 01/29/2007  . PROSTATE CANCER 01/29/2007  . BLADDER CANCER 01/29/2007  . HYPERLIPIDEMIA 01/29/2007  . THROMBOCYTOPENIA 01/29/2007  . HYPERTENSION 01/29/2007  . CAD 01/29/2007  . COLITIS, ULCERATIVE 01/29/2007  . DIVERTICULOSIS, COLON 01/29/2007    Orientation RESPIRATION BLADDER Height & Weight     Self  O2 (2L) Incontinent (doing in and out caths) Weight: 159 lb (72.1 kg) Height:  5\' 10"  (177.8 cm)  BEHAVIORAL SYMPTOMS/MOOD NEUROLOGICAL BOWEL NUTRITION STATUS   (confused)   Incontinent Diet (pending/speech follow )  AMBULATORY STATUS COMMUNICATION OF NEEDS Skin    Extensive Assist Verbally PU Stage and Appropriate Care (Left lower leg) PU Stage 1 Dressing: BID  Full thickness tissue loss to left anterior LE.  Bilateral heels are intact                     Personal Care Assistance Level of Assistance  Bathing, Feeding, Dressing Bathing Assistance: Limited assistance Feeding assistance: Independent Dressing Assistance: Limited assistance     Functional Limitations Info  Sight, Hearing, Speech Sight Info: Adequate Hearing Info: Adequate Speech Info: Adequate    SPECIAL CARE FACTORS FREQUENCY  PT (By licensed PT), Speech therapy     PT Frequency: 5x              Contractures Contractures Info: Not present    Additional Factors Info  Code Status, Allergies, Psychotropic Code Status Info: DNR Allergies Info: Aricept Donepezil Hydrochloride, Cortisone, Prednisone, Sulfa Antibiotics Psychotropic Info: See MAR         Current Medications (11/12/2015):  This is the current hospital active medication list Current Facility-Administered Medications  Medication Dose Route Frequency Provider Last Rate Last Dose  . acetaminophen (TYLENOL) tablet 650 mg  650 mg Oral Q6H PRN [name], MD       Or  . acetaminophen (TYLENOL) suppository 650 mg  650 mg Rectal Q6H PRN [name], MD   650 mg at 11/11/15 1543  . acetaminophen (TYLENOL) tablet 650 mg  650 mg Oral Once Harvest Dark, MD      . azithromycin [facility]) tablet 500 mg  500 mg Oral Daily Fritzi Mandes, MD       Followed by  . [START ON 11/13/2015]  azithromycin (ZITHROMAX) tablet 250 mg  250 mg Oral Daily Fritzi Mandes, MD      . Derrill Memo ON 11/13/2015] cefUROXime (CEFTIN) tablet 500 mg  500 mg Oral Daily Fritzi Mandes, MD      . enoxaparin (LOVENOX) injection 30 mg  30 mg Subcutaneous Q24H Ramond Dial, RPH      . fluticasone (FLONASE) 50 MCG/ACT nasal spray 2 spray  2 spray Each Nare QPM [name], MD   2 spray at 11/11/15 1723  . haloperidol lactate (HALDOL) injection 1 mg  1  mg Intravenous Q8H PRN Fritzi Mandes, MD      . ipratropium-albuterol (DUONEB) 0.5-2.5 (3) MG/3ML nebulizer solution 3 mL  3 mL Nebulization Q6H [name], MD   3 mL at 11/12/15 0721  . ipratropium-albuterol (DUONEB) 0.5-2.5 (3) MG/3ML nebulizer solution 3 mL  3 mL Nebulization Q4H PRN Bettey Costa, MD   3 mL at 11/11/15 1700  . nitroGLYCERIN (NITROSTAT) SL tablet 0.4 mg  0.4 mg Sublingual Q5 min PRN [name], MD      . ondansetron (ZOFRAN) tablet 4 mg  4 mg Oral Q6H PRN [name], MD       Or  . ondansetron (ZOFRAN) injection 4 mg  4 mg Intravenous Q6H PRN [name], MD         Discharge Medications: Please see discharge summary for a list of discharge medications.  Relevant Imaging Results:  Relevant Lab Results:   Additional Information SSN: [id number]   Hospice to follow at the facility.  Lilly Cove, LCSW

## 2015-11-12 NOTE — Clinical Social Work Note (Signed)
Clinical Social Work Assessment  Patient Details  Name: Dustin Byrd MRN: [id number] Date of Birth: 05-03-1918  Date of referral:  11/12/15               Reason for consult:  Discharge Planning (Patient admitted from [facility])                Permission sought to share information with:  Case Manager, Customer service manager, Family Supports Permission granted to share information::  Yes, Verbal Permission Granted  Name::        Agency::  [facility]  Relationship::  Daughter Judithe Modest Information:     Housing/Transportation Living arrangements for the past 2 months:  [place] of Information:  Medical Team, Tourist information centre manager, Adult Children, Facility Patient Interpreter Needed:  None Criminal Activity/Legal Involvement Pertinent to Current Situation/Hospitalization:  No - Comment as needed Significant Relationships:  Adult Children, Other Family Members Lives with:  Facility Resident Do you feel safe going back to the place where you live?  Yes Need for family participation in patient care:  Yes (Comment)  Care giving concerns:  No concerns noted by daughter or facility. Patient has been a LTC patient at [facility] since 2016, plan will be to return.    Social Worker assessment / plan:  LCSW received consult for discharge planning. No family at bedside, spoke with daughter Gay Filler regarding return. IN agreement with barriers at this time. [place] was contacted and updated that patient admitted and sent information through the [facility] for review. Plan will be to return at DC to SNF. LCSW will continue to follow acutely.  Employment status:  Retired Health visitor PT Recommendations:  [place], Not assessed at this time Information / Referral to community resources:     Patient/Family's Response to care:  Agreeable to plan  Patient/Family's Understanding of and Emotional Response to Diagnosis, Current  Treatment, and Prognosis:  Daughter updated and understands reason for admission and concern. No additional questions, verbalizes appreciation for assistance.   Emotional Assessment Appearance:  Appears stated age Attitude/Demeanor/Rapport:  Apprehensive, Other (confused) Affect (typically observed):  Restless Orientation:  Oriented to Self Alcohol / Substance use:  Not Applicable Psych involvement (Current and /or in the community):  No (Comment)  Discharge Needs  Concerns to be addressed:  No discharge needs identified Readmission within the last 30 days:  No Current discharge risk:  None Barriers to Discharge:  No Barriers Identified, Continued Medical Work up   Lilly Cove, LCSW 11/12/2015, 11:46 AM

## 2015-11-12 NOTE — Progress Notes (Signed)
Patient has orders for enoxaparin 40mg  daily. Pt has had rise in scr and CrCl is now <6ml.min. Per protocol will reduce dose to 30mg  daily.  Ramond Dial, Pharm.D Clinical Pharmacist

## 2015-11-13 DIAGNOSIS — J181 Lobar pneumonia, unspecified organism: Secondary | ICD-10-CM | POA: Diagnosis not present

## 2015-11-13 DIAGNOSIS — J189 Pneumonia, unspecified organism: Secondary | ICD-10-CM

## 2015-11-13 DIAGNOSIS — F0150 Vascular dementia without behavioral disturbance: Secondary | ICD-10-CM | POA: Diagnosis not present

## 2015-11-13 DIAGNOSIS — Z515 Encounter for palliative care: Secondary | ICD-10-CM

## 2015-11-13 DIAGNOSIS — Z7189 Other specified counseling: Secondary | ICD-10-CM

## 2015-11-13 LAB — CREATININE, SERUM
Creatinine, Ser: 1.34 mg/dL — ABNORMAL HIGH (ref 0.61–1.24)
GFR, EST AFRICAN AMERICAN: 50 mL/min — AB (ref >60)
GFR, EST NON AFRICAN AMERICAN: 43 mL/min — AB (ref >60)

## 2015-11-13 MED ORDER — AZITHROMYCIN 250 MG PO TABS: ORAL_TABLET | ORAL | 0 refills | Status: AC

## 2015-11-13 MED ORDER — CEFUROXIME AXETIL 500 MG PO TABS: 500.0000 mg | ORAL_TABLET | Freq: Every day | ORAL | 0 refills | Status: AC

## 2015-11-13 MED ORDER — AMLODIPINE BESYLATE 5 MG PO TABS: 5.0000 mg | ORAL_TABLET | Freq: Every day | ORAL | 0 refills | Status: AC

## 2015-11-13 MED ORDER — CEFUROXIME AXETIL 250 MG PO TABS: 500.0000 mg | ORAL_TABLET | Freq: Two times a day (BID) | ORAL | Status: DC

## 2015-11-13 NOTE — Discharge Instructions (Signed)
Hospice to follow at [facility] °

## 2015-11-13 NOTE — Progress Notes (Signed)
Pt being discharged to [facility], report called to Karen Kitchens, EMS called for transport, pt with no complaints, no distress or discomfort noted

## 2015-11-13 NOTE — Progress Notes (Signed)
Patient is medically stable for discharge today. Patient will return to SNF: [facility] with Hospice Following patient.  LCSW spoke with [facility] and they are in agreement to plan for him to return. Daughter Gay Filler contacted and in agreement. Patient will transport by EMS.  No other needs at this time. Will sign off. All documents sent through [employer].  Lane Hacker, MSW Clinical Social Work: Printmaker Coverage for :  [phone]

## 2015-11-13 NOTE — Discharge Summary (Signed)
[facility] [place] NAME: Kelsie Riebel    MR#:  [id number]  DATE OF BIRTH:  [birth year]  DATE OF ADMISSION:  11/10/2015 ADMITTING PHYSICIAN: [name], MD  DATE OF DISCHARGE: 11/13/15  PRIMARY CARE PHYSICIAN: Viviana Simpler, MD    ADMISSION DIAGNOSIS:  Healthcare-associated pneumonia [J18.9] Sepsis, due to unspecified organism ([place]) [A41.9]  DISCHARGE DIAGNOSIS:  Sepsis due to Pneumonia and Proteus UTI HTN SECONDARY DIAGNOSIS:   Past Medical History:  Diagnosis Date  . Anxiety   . Asbestos exposure   . Benign hypertensive kidney disease with chronic kidney disease stage I through stage IV, or unspecified(403.10)   . CAD (coronary artery disease)   . Chronic kidney disease, stage III (moderate)   . Colon cancer ([place]) 1994  . Diverticulosis of colon (without mention of hemorrhage)   . DJD of shoulder   . Encounter for long-term (current) use of other medications   . Esophageal stricture   . Gout   . Hypercholesterolemia   . Memory loss   . Orthostatic hypotension   . Personal history of colonic polyps 1991   villous adenoma of cecum  . UC (ulcerative colitis) [facility])     HOSPITAL COURSE:   82 year old male with chronic kidney disease stage III, colon, bladder and prostate cancer who presents from [facility] with decreased alertness and found to have sepsis due to pneumonia.  1. Sepsis due to pneumonia: Patient presented with fever and tachycardia  2. HCAP: Continue vancomycin and Zosyn---change to rocephin and zithromax---change to po ceftin and zithromax MRSA PCR negtive---d/c vanc Wean oxygen as tolerated Speech evaluation recommendations noted  3. Left leg wound:  - wound culture shows few WBC, NO organisms.  -Cont wound care.  4. Essential hypertension: -started on low dose amlodipine  5. History of colon, bladder and prostate cancer:   6. Hypokalemia: Repleted  7. Dementia: May need to continue  use when necessary Haldol for agitation Palliative care evaluation appreciated. MOST form in the chart. dter request hospice follow at [facility] Pt is DNR   CONSULTS OBTAINED:    DRUG ALLERGIES:   Allergies  Allergen Reactions  . Aricept [Donepezil Hydrochloride]     agitation  . Cortisone   . Prednisone     Mania   . Sulfa Antibiotics Other (See Comments)    Listed on nursing home chart    DISCHARGE MEDICATIONS:   Current Discharge Medication List    START taking these medications   Details  amLODipine (NORVASC) 5 MG tablet Take 1 tablet (5 mg total) by mouth daily. Qty: 30 tablet, Refills: 0    azithromycin (ZITHROMAX) 250 MG tablet Take as directed Qty: 4 each, Refills: 0    cefUROXime (CEFTIN) 500 MG tablet Take 1 tablet (500 mg total) by mouth daily. Qty: 8 tablet, Refills: 0      CONTINUE these medications which have NOT CHANGED   Details  acetaminophen (TYLENOL) 325 MG tablet Take 650 mg by mouth once as needed for mild pain or fever.     allopurinol (ZYLOPRIM) 100 MG tablet TAKE 1 TABLET BY MOUTH ONCE DAILY FOR GOUT Qty: 30 tablet, Refills: 11    aspirin 81 MG chewable tablet TAKE 1 TABLET BY MOUTH EACH DAY. ANTI PLATELET AGGREGATION Qty: 30 tablet, Refills: 11    citalopram (CELEXA) 20 MG tablet Take 20 mg by mouth every morning.    COLCRYS 0.6 MG tablet TAKE 1 TABLET BY MOUTH ONCE DAILY FOR  GOUT Qty: 30 tablet, Refills: 11    finasteride (PROSCAR) 5 MG tablet Take 1 tablet (5 mg total) by mouth daily. Qty: 30 tablet, Refills: 0    fludrocortisone (FLORINEF) 0.1 MG tablet Take 0.1 mg by mouth every morning.     fluticasone (FLONASE) 50 MCG/ACT nasal spray Place 2 sprays into both nostrils every evening.     ipratropium-albuterol (DUONEB) 0.5-2.5 (3) MG/3ML SOLN Take 3 mLs by nebulization every 4 (four) hours as needed.    loratadine (CLARITIN) 10 MG tablet Take 1 tab BID x 5 days then daily prn thereafter Qty: 10 tablet, Refills: 0    Associated Diagnoses: Allergic rhinitis, unspecified allergic rhinitis type    LORazepam (ATIVAN) 0.5 MG tablet Take 0.5 tablets (0.25 mg total) by mouth every 8 (eight) hours as needed for anxiety. 1/2 tab up to 3 times daily as needed for anxiety Qty: 30 tablet, Refills: 0    mineral oil enema Place 1 enema rectally once a week. Once a week as needed for constipation.    nitroGLYCERIN (NITROSTAT) 0.4 MG SL tablet Place 1 tablet (0.4 mg total) under the tongue every 5 (five) minutes as needed. For chest pain. Call 911 after 3 doses if no relief. Qty: 25 tablet, Refills: 1    Nutritional Supplements (ARGINAID EXTRA PO) Take 1 Dose by mouth 2 (two) times daily.    omeprazole (PRILOSEC) 20 MG capsule Take 20 mg by mouth daily.    PENTASA 250 MG CR capsule TAKE 1 CAPSULE BY MOUTH FOUR TIMES A DAY FOR ULCERATIVE COLITIS. Qty: 120 capsule, Refills: 11    polyethylene glycol (MIRALAX / GLYCOLAX) packet Take 17 g by mouth 2 (two) times daily. Qty: 200 each, Refills: 3    sennosides-docusate sodium (SENOKOT-S) 8.6-50 MG tablet TAKE 2 TABLETS BY MOUTH TWICE A DAY FOR STOOL SOFTENER. TAKE WITH GLASS OF WATER. Qty: 120 tablet, Refills: 11    tamsulosin (FLOMAX) 0.4 MG CAPS capsule Take 1 capsule (0.4 mg total) by mouth daily. Qty: 30 capsule, Refills: 4    traMADol (ULTRAM) 50 MG tablet Take 25 mg by mouth 3 (three) times daily as needed.        If you experience worsening of your admission symptoms, develop shortness of breath, life threatening emergency, suicidal or homicidal thoughts you must seek medical attention immediately by calling 911 or calling your MD immediately  if symptoms less severe.  You Must read complete instructions/literature along with all the possible adverse reactions/side effects for all the Medicines you take and that have been prescribed to you. Take any new Medicines after you have completely understood and accept all the possible adverse reactions/side effects.    Please note  You were cared for by a hospitalist during your hospital stay. If you have any questions about your discharge medications or the care you received while you were in the hospital after you are discharged, you can call the unit and asked to speak with the hospitalist on call if the hospitalist that took care of you is not available. Once you are discharged, your primary care physician will handle any further medical issues. Please note that NO REFILLS for any discharge medications will be authorized once you are discharged, as it is imperative that you return to your primary care physician (or establish a relationship with a primary care physician if you do not have one) for your aftercare needs so that they can reassess your need for medications and monitor your lab values. Today  SUBJECTIVE  Wide awake, eating BF and having a wonderful conversation with RN and me :)   VITAL SIGNS:  Blood pressure (!) 174/84, pulse 79, temperature 98.6 F (37 C), temperature source Oral, resp. rate 19, height 5\' 10"  (1.778 m), weight 72.1 kg (159 lb), SpO2 97 %.  I/O:   Intake/Output Summary (Last 24 hours) at 11/13/15 0852 Last data filed at 11/12/15 1009  Gross per 24 hour  Intake                0 ml  Output              720 ml  Net             -720 ml    PHYSICAL EXAMINATION:  GENERAL:  82 y.o.-year-old patient lying in the bed with no acute distress.  EYES: Pupils equal, round, reactive to light and accommodation. No scleral icterus. Extraocular muscles intact.  HEENT: Head atraumatic, normocephalic. Oropharynx and nasopharynx clear.  NECK:  Supple, no jugular venous distention. No thyroid enlargement, no tenderness.  LUNGS: Normal breath sounds bilaterally, no wheezing, rales,rhonchi or crepitation. No use of accessory muscles of respiration.  CARDIOVASCULAR: S1, S2 normal. No murmurs, rubs, or gallops.  ABDOMEN: Soft, non-tender, non-distended. Bowel sounds present. No  organomegaly or mass.  EXTREMITIES: No pedal edema, cyanosis, or clubbing.  NEUROLOGIC: grossly non focal PSYCHIATRIC: The patient is alert and oriented x 3.  SKIN: No obvious rash, lesion, or ulcer. Leg wound dressing+  DATA REVIEW:   CBC   Recent Labs Lab 11/11/15 0439  WBC 9.3  HGB 12.4*  HCT 36.2*  PLT 121*    Chemistries   Recent Labs Lab 11/11/15 0439 11/12/15 0501 11/13/15 0412  NA 138 139  --   K 3.4* 3.5  --   CL 105 101  --   CO2 25 26  --   GLUCOSE 88 76  --   BUN 24* 27*  --   CREATININE 1.12 1.57* 1.34*  CALCIUM 8.2* 8.5*  --   AST 25  --   --   ALT 12*  --   --   ALKPHOS 62  --   --   BILITOT 1.3*  --   --     Microbiology Results   Recent Results (from the past 240 hour(s))  Blood Culture (routine x 2)     Status: None (Preliminary result)   Collection Time: 11/10/15  8:54 AM  Result Value Ref Range Status   Specimen Description BLOOD RIGHT ARM  Final   Special Requests BOTTLES DRAWN AEROBIC AND ANAEROBIC 10CC  Final   Culture NO GROWTH 3 DAYS  Final   Report Status PENDING  Incomplete  Blood Culture (routine x 2)     Status: None (Preliminary result)   Collection Time: 11/10/15  9:05 AM  Result Value Ref Range Status   Specimen Description BLOOD LEFT ARM  Final   Special Requests BOTTLES DRAWN AEROBIC AND ANAEROBIC 10CC  Final   Culture NO GROWTH 3 DAYS  Final   Report Status PENDING  Incomplete  Urine culture     Status: Abnormal   Collection Time: 11/10/15 11:37 AM  Result Value Ref Range Status   Specimen Description URINE, RANDOM  Final   Special Requests NONE  Final   Culture 70,000 COLONIES/mL PROTEUS MIRABILIS (A)  Final   Report Status 11/12/2015 FINAL  Final   Organism ID, Bacteria PROTEUS MIRABILIS (A)  Final  Susceptibility   Proteus mirabilis - MIC*    AMPICILLIN >=32 RESISTANT Resistant     CEFAZOLIN 8 SENSITIVE Sensitive     CEFTRIAXONE <=1 SENSITIVE Sensitive     CIPROFLOXACIN >=4 RESISTANT Resistant      GENTAMICIN <=1 SENSITIVE Sensitive     IMIPENEM 1 SENSITIVE Sensitive     NITROFURANTOIN >=512 RESISTANT Resistant     TRIMETH/SULFA >=320 RESISTANT Resistant     AMPICILLIN/SULBACTAM 8 SENSITIVE Sensitive     * 70,000 COLONIES/mL PROTEUS MIRABILIS  MRSA PCR Screening     Status: None   Collection Time: 11/10/15  4:15 PM  Result Value Ref Range Status   MRSA by PCR NEGATIVE NEGATIVE Final    Comment:        The GeneXpert MRSA Assay (FDA approved for NASAL specimens only), is one component of a comprehensive MRSA colonization surveillance program. It is not intended to diagnose MRSA infection nor to guide or monitor treatment for MRSA infections.   Aerobic/Anaerobic Culture (surgical/deep wound)     Status: None (Preliminary result)   Collection Time: 11/10/15  5:05 PM  Result Value Ref Range Status   Specimen Description LEG  Final   Special Requests NONE  Final   Gram Stain   Final    RARE WBC PRESENT, PREDOMINANTLY PMN NO ORGANISMS SEEN    Culture   Final    CULTURE REINCUBATED FOR BETTER GROWTH Performed at [facility]    Report Status PENDING  Incomplete    RADIOLOGY:  No results found.   Management plans discussed with the patient, family and they are in agreement.  CODE STATUS:     Code Status Orders        Start     Ordered   11/10/15 1549  Do not attempt resuscitation (DNR)  Continuous    Question Answer Comment  In the event of cardiac or respiratory ARREST Do not call a "code blue"   In the event of cardiac or respiratory ARREST Do not perform Intubation, CPR, defibrillation or ACLS   In the event of cardiac or respiratory ARREST Use medication by any route, position, wound care, and other measures to relive pain and suffering. May use oxygen, suction and manual treatment of airway obstruction as needed for comfort.   Comments RN may pronounce      11/10/15 1548    Code Status History    Date Active Date Inactive Code Status Order ID  Comments User Context   09/05/2014  6:47 PM 09/08/2014  4:58 PM DNR [id number]  Corky Mull, MD Inpatient   09/04/2014 11:39 PM 09/05/2014  6:47 PM DNR [id number]  Lytle Butte, MD ED    Advance Directive Documentation   Flowsheet Row Most Recent Value  Type of Advance Directive  Healthcare Power of [place], Out of facility DNR (pink MOST or yellow form)  Pre-existing out of facility DNR order (yellow form or pink MOST form)  Yellow form placed in chart (order not valid for inpatient use)  "MOST" Form in Place?  No data      TOTAL TIME TAKING CARE OF THIS PATIENT: 40 minutes.    Ruger Saxer M.D on 11/13/2015 at 8:52 AM  Between 7am to 6pm - Pager - [phone] After 6pm go to [email or web address] - password [email or web address] [facility]  Office  [phone]  CC: Primary care physician; Viviana Simpler, MD

## 2015-11-14 DIAGNOSIS — I129 Hypertensive chronic kidney disease with stage 1 through stage 4 chronic kidney disease, or unspecified chronic kidney disease: Secondary | ICD-10-CM | POA: Diagnosis not present

## 2015-11-14 DIAGNOSIS — N183 Chronic kidney disease, stage 3 (moderate): Secondary | ICD-10-CM | POA: Diagnosis not present

## 2015-11-14 DIAGNOSIS — F419 Anxiety disorder, unspecified: Secondary | ICD-10-CM | POA: Diagnosis not present

## 2015-11-14 DIAGNOSIS — J189 Pneumonia, unspecified organism: Secondary | ICD-10-CM | POA: Diagnosis not present

## 2015-11-14 DIAGNOSIS — F0390 Unspecified dementia without behavioral disturbance: Secondary | ICD-10-CM | POA: Diagnosis not present

## 2015-11-14 DIAGNOSIS — Z8551 Personal history of malignant neoplasm of bladder: Secondary | ICD-10-CM | POA: Diagnosis not present

## 2015-11-14 DIAGNOSIS — A419 Sepsis, unspecified organism: Secondary | ICD-10-CM | POA: Diagnosis not present

## 2015-11-14 DIAGNOSIS — E7800 Pure hypercholesterolemia, unspecified: Secondary | ICD-10-CM | POA: Diagnosis not present

## 2015-11-14 DIAGNOSIS — I2510 Atherosclerotic heart disease of native coronary artery without angina pectoris: Secondary | ICD-10-CM | POA: Diagnosis not present

## 2015-11-14 DIAGNOSIS — S81802D Unspecified open wound, left lower leg, subsequent encounter: Secondary | ICD-10-CM | POA: Diagnosis not present

## 2015-11-14 DIAGNOSIS — B964 Proteus (mirabilis) (morganii) as the cause of diseases classified elsewhere: Secondary | ICD-10-CM | POA: Diagnosis not present

## 2015-11-14 DIAGNOSIS — K5790 Diverticulosis of intestine, part unspecified, without perforation or abscess without bleeding: Secondary | ICD-10-CM | POA: Diagnosis not present

## 2015-11-14 DIAGNOSIS — Z9049 Acquired absence of other specified parts of digestive tract: Secondary | ICD-10-CM | POA: Diagnosis not present

## 2015-11-14 DIAGNOSIS — K5190 Ulcerative colitis, unspecified, without complications: Secondary | ICD-10-CM | POA: Diagnosis not present

## 2015-11-14 DIAGNOSIS — K222 Esophageal obstruction: Secondary | ICD-10-CM | POA: Diagnosis not present

## 2015-11-14 DIAGNOSIS — Z951 Presence of aortocoronary bypass graft: Secondary | ICD-10-CM | POA: Diagnosis not present

## 2015-11-14 DIAGNOSIS — N390 Urinary tract infection, site not specified: Secondary | ICD-10-CM | POA: Diagnosis not present

## 2015-11-14 DIAGNOSIS — Z8546 Personal history of malignant neoplasm of prostate: Secondary | ICD-10-CM | POA: Diagnosis not present

## 2015-11-14 DIAGNOSIS — M19019 Primary osteoarthritis, unspecified shoulder: Secondary | ICD-10-CM | POA: Diagnosis not present

## 2015-11-14 DIAGNOSIS — M109 Gout, unspecified: Secondary | ICD-10-CM | POA: Diagnosis not present

## 2015-11-14 DIAGNOSIS — Z87891 Personal history of nicotine dependence: Secondary | ICD-10-CM | POA: Diagnosis not present

## 2015-11-14 DIAGNOSIS — Z85038 Personal history of other malignant neoplasm of large intestine: Secondary | ICD-10-CM | POA: Diagnosis not present

## 2015-11-15 DIAGNOSIS — Z8673 Personal history of transient ischemic attack (TIA), and cerebral infarction without residual deficits: Secondary | ICD-10-CM | POA: Diagnosis not present

## 2015-11-15 DIAGNOSIS — A419 Sepsis, unspecified organism: Secondary | ICD-10-CM | POA: Diagnosis not present

## 2015-11-15 DIAGNOSIS — S81802A Unspecified open wound, left lower leg, initial encounter: Secondary | ICD-10-CM | POA: Diagnosis not present

## 2015-11-15 DIAGNOSIS — B964 Proteus (mirabilis) (morganii) as the cause of diseases classified elsewhere: Secondary | ICD-10-CM | POA: Diagnosis not present

## 2015-11-15 DIAGNOSIS — I2510 Atherosclerotic heart disease of native coronary artery without angina pectoris: Secondary | ICD-10-CM | POA: Diagnosis not present

## 2015-11-15 DIAGNOSIS — I129 Hypertensive chronic kidney disease with stage 1 through stage 4 chronic kidney disease, or unspecified chronic kidney disease: Secondary | ICD-10-CM | POA: Diagnosis not present

## 2015-11-15 DIAGNOSIS — J189 Pneumonia, unspecified organism: Secondary | ICD-10-CM | POA: Diagnosis not present

## 2015-11-15 DIAGNOSIS — N183 Chronic kidney disease, stage 3 (moderate): Secondary | ICD-10-CM | POA: Diagnosis not present

## 2015-11-15 DIAGNOSIS — N390 Urinary tract infection, site not specified: Secondary | ICD-10-CM | POA: Diagnosis not present

## 2015-11-15 DIAGNOSIS — D696 Thrombocytopenia, unspecified: Secondary | ICD-10-CM | POA: Diagnosis not present

## 2015-11-15 LAB — CULTURE, BLOOD (ROUTINE X 2)
CULTURE: NO GROWTH
CULTURE: NO GROWTH

## 2015-11-16 DIAGNOSIS — A419 Sepsis, unspecified organism: Secondary | ICD-10-CM | POA: Diagnosis not present

## 2015-11-16 DIAGNOSIS — N390 Urinary tract infection, site not specified: Secondary | ICD-10-CM | POA: Diagnosis not present

## 2015-11-16 DIAGNOSIS — B964 Proteus (mirabilis) (morganii) as the cause of diseases classified elsewhere: Secondary | ICD-10-CM | POA: Diagnosis not present

## 2015-11-16 DIAGNOSIS — J189 Pneumonia, unspecified organism: Secondary | ICD-10-CM | POA: Diagnosis not present

## 2015-11-16 DIAGNOSIS — I2510 Atherosclerotic heart disease of native coronary artery without angina pectoris: Secondary | ICD-10-CM | POA: Diagnosis not present

## 2015-11-16 DIAGNOSIS — I129 Hypertensive chronic kidney disease with stage 1 through stage 4 chronic kidney disease, or unspecified chronic kidney disease: Secondary | ICD-10-CM | POA: Diagnosis not present

## 2015-11-16 LAB — AEROBIC/ANAEROBIC CULTURE W GRAM STAIN (SURGICAL/DEEP WOUND)

## 2015-11-16 LAB — AEROBIC/ANAEROBIC CULTURE (SURGICAL/DEEP WOUND): CULTURE: NORMAL

## 2015-11-19 DIAGNOSIS — N390 Urinary tract infection, site not specified: Secondary | ICD-10-CM | POA: Diagnosis not present

## 2015-11-19 DIAGNOSIS — B964 Proteus (mirabilis) (morganii) as the cause of diseases classified elsewhere: Secondary | ICD-10-CM | POA: Diagnosis not present

## 2015-11-19 DIAGNOSIS — I2510 Atherosclerotic heart disease of native coronary artery without angina pectoris: Secondary | ICD-10-CM | POA: Diagnosis not present

## 2015-11-19 DIAGNOSIS — A419 Sepsis, unspecified organism: Secondary | ICD-10-CM | POA: Diagnosis not present

## 2015-11-19 DIAGNOSIS — I129 Hypertensive chronic kidney disease with stage 1 through stage 4 chronic kidney disease, or unspecified chronic kidney disease: Secondary | ICD-10-CM | POA: Diagnosis not present

## 2015-11-19 DIAGNOSIS — J189 Pneumonia, unspecified organism: Secondary | ICD-10-CM | POA: Diagnosis not present

## 2015-11-20 DIAGNOSIS — J189 Pneumonia, unspecified organism: Secondary | ICD-10-CM | POA: Diagnosis not present

## 2015-11-20 DIAGNOSIS — B964 Proteus (mirabilis) (morganii) as the cause of diseases classified elsewhere: Secondary | ICD-10-CM | POA: Diagnosis not present

## 2015-11-20 DIAGNOSIS — I2510 Atherosclerotic heart disease of native coronary artery without angina pectoris: Secondary | ICD-10-CM | POA: Diagnosis not present

## 2015-11-20 DIAGNOSIS — N390 Urinary tract infection, site not specified: Secondary | ICD-10-CM | POA: Diagnosis not present

## 2015-11-20 DIAGNOSIS — I129 Hypertensive chronic kidney disease with stage 1 through stage 4 chronic kidney disease, or unspecified chronic kidney disease: Secondary | ICD-10-CM | POA: Diagnosis not present

## 2015-11-20 DIAGNOSIS — A419 Sepsis, unspecified organism: Secondary | ICD-10-CM | POA: Diagnosis not present

## 2015-11-21 ENCOUNTER — Ambulatory Visit: Payer: Medicare Other | Admitting: Internal Medicine

## 2015-11-21 DIAGNOSIS — I2510 Atherosclerotic heart disease of native coronary artery without angina pectoris: Secondary | ICD-10-CM | POA: Diagnosis not present

## 2015-11-21 DIAGNOSIS — N390 Urinary tract infection, site not specified: Secondary | ICD-10-CM | POA: Diagnosis not present

## 2015-11-21 DIAGNOSIS — A419 Sepsis, unspecified organism: Secondary | ICD-10-CM | POA: Diagnosis not present

## 2015-11-21 DIAGNOSIS — B964 Proteus (mirabilis) (morganii) as the cause of diseases classified elsewhere: Secondary | ICD-10-CM | POA: Diagnosis not present

## 2015-11-21 DIAGNOSIS — I129 Hypertensive chronic kidney disease with stage 1 through stage 4 chronic kidney disease, or unspecified chronic kidney disease: Secondary | ICD-10-CM | POA: Diagnosis not present

## 2015-11-21 DIAGNOSIS — J189 Pneumonia, unspecified organism: Secondary | ICD-10-CM | POA: Diagnosis not present

## 2015-11-22 DIAGNOSIS — N183 Chronic kidney disease, stage 3 (moderate): Secondary | ICD-10-CM | POA: Diagnosis not present

## 2015-11-22 DIAGNOSIS — N390 Urinary tract infection, site not specified: Secondary | ICD-10-CM | POA: Diagnosis not present

## 2015-11-22 DIAGNOSIS — F0390 Unspecified dementia without behavioral disturbance: Secondary | ICD-10-CM | POA: Diagnosis not present

## 2015-11-22 DIAGNOSIS — I129 Hypertensive chronic kidney disease with stage 1 through stage 4 chronic kidney disease, or unspecified chronic kidney disease: Secondary | ICD-10-CM | POA: Diagnosis not present

## 2015-11-22 DIAGNOSIS — Z8673 Personal history of transient ischemic attack (TIA), and cerebral infarction without residual deficits: Secondary | ICD-10-CM | POA: Diagnosis not present

## 2015-11-22 DIAGNOSIS — S81802A Unspecified open wound, left lower leg, initial encounter: Secondary | ICD-10-CM | POA: Diagnosis not present

## 2015-11-22 DIAGNOSIS — J189 Pneumonia, unspecified organism: Secondary | ICD-10-CM | POA: Diagnosis not present

## 2015-11-22 DIAGNOSIS — B964 Proteus (mirabilis) (morganii) as the cause of diseases classified elsewhere: Secondary | ICD-10-CM | POA: Diagnosis not present

## 2015-11-22 DIAGNOSIS — A419 Sepsis, unspecified organism: Secondary | ICD-10-CM | POA: Diagnosis not present

## 2015-11-22 DIAGNOSIS — I2510 Atherosclerotic heart disease of native coronary artery without angina pectoris: Secondary | ICD-10-CM | POA: Diagnosis not present

## 2015-11-23 DIAGNOSIS — J189 Pneumonia, unspecified organism: Secondary | ICD-10-CM | POA: Diagnosis not present

## 2015-11-23 DIAGNOSIS — N390 Urinary tract infection, site not specified: Secondary | ICD-10-CM | POA: Diagnosis not present

## 2015-11-23 DIAGNOSIS — I129 Hypertensive chronic kidney disease with stage 1 through stage 4 chronic kidney disease, or unspecified chronic kidney disease: Secondary | ICD-10-CM | POA: Diagnosis not present

## 2015-11-23 DIAGNOSIS — B964 Proteus (mirabilis) (morganii) as the cause of diseases classified elsewhere: Secondary | ICD-10-CM | POA: Diagnosis not present

## 2015-11-23 DIAGNOSIS — I2510 Atherosclerotic heart disease of native coronary artery without angina pectoris: Secondary | ICD-10-CM | POA: Diagnosis not present

## 2015-11-23 DIAGNOSIS — A419 Sepsis, unspecified organism: Secondary | ICD-10-CM | POA: Diagnosis not present

## 2015-11-25 DIAGNOSIS — B964 Proteus (mirabilis) (morganii) as the cause of diseases classified elsewhere: Secondary | ICD-10-CM | POA: Diagnosis not present

## 2015-11-25 DIAGNOSIS — A419 Sepsis, unspecified organism: Secondary | ICD-10-CM | POA: Diagnosis not present

## 2015-11-25 DIAGNOSIS — I2510 Atherosclerotic heart disease of native coronary artery without angina pectoris: Secondary | ICD-10-CM | POA: Diagnosis not present

## 2015-11-25 DIAGNOSIS — J189 Pneumonia, unspecified organism: Secondary | ICD-10-CM | POA: Diagnosis not present

## 2015-11-25 DIAGNOSIS — I129 Hypertensive chronic kidney disease with stage 1 through stage 4 chronic kidney disease, or unspecified chronic kidney disease: Secondary | ICD-10-CM | POA: Diagnosis not present

## 2015-11-25 DIAGNOSIS — N390 Urinary tract infection, site not specified: Secondary | ICD-10-CM | POA: Diagnosis not present

## 2015-11-26 DIAGNOSIS — J189 Pneumonia, unspecified organism: Secondary | ICD-10-CM | POA: Diagnosis not present

## 2015-11-26 DIAGNOSIS — A419 Sepsis, unspecified organism: Secondary | ICD-10-CM | POA: Diagnosis not present

## 2015-11-26 DIAGNOSIS — B964 Proteus (mirabilis) (morganii) as the cause of diseases classified elsewhere: Secondary | ICD-10-CM | POA: Diagnosis not present

## 2015-11-26 DIAGNOSIS — I2510 Atherosclerotic heart disease of native coronary artery without angina pectoris: Secondary | ICD-10-CM | POA: Diagnosis not present

## 2015-11-26 DIAGNOSIS — I129 Hypertensive chronic kidney disease with stage 1 through stage 4 chronic kidney disease, or unspecified chronic kidney disease: Secondary | ICD-10-CM | POA: Diagnosis not present

## 2015-11-26 DIAGNOSIS — N390 Urinary tract infection, site not specified: Secondary | ICD-10-CM | POA: Diagnosis not present

## 2015-11-27 DIAGNOSIS — B964 Proteus (mirabilis) (morganii) as the cause of diseases classified elsewhere: Secondary | ICD-10-CM | POA: Diagnosis not present

## 2015-11-27 DIAGNOSIS — N390 Urinary tract infection, site not specified: Secondary | ICD-10-CM | POA: Diagnosis not present

## 2015-11-27 DIAGNOSIS — S81802D Unspecified open wound, left lower leg, subsequent encounter: Secondary | ICD-10-CM | POA: Diagnosis not present

## 2015-11-27 DIAGNOSIS — J189 Pneumonia, unspecified organism: Secondary | ICD-10-CM | POA: Diagnosis not present

## 2015-11-27 DIAGNOSIS — A419 Sepsis, unspecified organism: Secondary | ICD-10-CM | POA: Diagnosis not present

## 2015-11-27 DIAGNOSIS — I2510 Atherosclerotic heart disease of native coronary artery without angina pectoris: Secondary | ICD-10-CM | POA: Diagnosis not present

## 2015-11-27 DIAGNOSIS — I129 Hypertensive chronic kidney disease with stage 1 through stage 4 chronic kidney disease, or unspecified chronic kidney disease: Secondary | ICD-10-CM | POA: Diagnosis not present

## 2015-11-28 DIAGNOSIS — I129 Hypertensive chronic kidney disease with stage 1 through stage 4 chronic kidney disease, or unspecified chronic kidney disease: Secondary | ICD-10-CM | POA: Diagnosis not present

## 2015-11-28 DIAGNOSIS — A419 Sepsis, unspecified organism: Secondary | ICD-10-CM | POA: Diagnosis not present

## 2015-11-28 DIAGNOSIS — J189 Pneumonia, unspecified organism: Secondary | ICD-10-CM | POA: Diagnosis not present

## 2015-11-28 DIAGNOSIS — B964 Proteus (mirabilis) (morganii) as the cause of diseases classified elsewhere: Secondary | ICD-10-CM | POA: Diagnosis not present

## 2015-11-28 DIAGNOSIS — N390 Urinary tract infection, site not specified: Secondary | ICD-10-CM | POA: Diagnosis not present

## 2015-11-28 DIAGNOSIS — I2510 Atherosclerotic heart disease of native coronary artery without angina pectoris: Secondary | ICD-10-CM | POA: Diagnosis not present

## 2015-11-29 DIAGNOSIS — A419 Sepsis, unspecified organism: Secondary | ICD-10-CM | POA: Diagnosis not present

## 2015-11-29 DIAGNOSIS — I2510 Atherosclerotic heart disease of native coronary artery without angina pectoris: Secondary | ICD-10-CM | POA: Diagnosis not present

## 2015-11-29 DIAGNOSIS — I129 Hypertensive chronic kidney disease with stage 1 through stage 4 chronic kidney disease, or unspecified chronic kidney disease: Secondary | ICD-10-CM | POA: Diagnosis not present

## 2015-11-29 DIAGNOSIS — B964 Proteus (mirabilis) (morganii) as the cause of diseases classified elsewhere: Secondary | ICD-10-CM | POA: Diagnosis not present

## 2015-11-29 DIAGNOSIS — N390 Urinary tract infection, site not specified: Secondary | ICD-10-CM | POA: Diagnosis not present

## 2015-11-29 DIAGNOSIS — J189 Pneumonia, unspecified organism: Secondary | ICD-10-CM | POA: Diagnosis not present

## 2015-11-30 DIAGNOSIS — A419 Sepsis, unspecified organism: Secondary | ICD-10-CM | POA: Diagnosis not present

## 2015-11-30 DIAGNOSIS — N390 Urinary tract infection, site not specified: Secondary | ICD-10-CM | POA: Diagnosis not present

## 2015-11-30 DIAGNOSIS — J189 Pneumonia, unspecified organism: Secondary | ICD-10-CM | POA: Diagnosis not present

## 2015-11-30 DIAGNOSIS — I129 Hypertensive chronic kidney disease with stage 1 through stage 4 chronic kidney disease, or unspecified chronic kidney disease: Secondary | ICD-10-CM | POA: Diagnosis not present

## 2015-11-30 DIAGNOSIS — I2510 Atherosclerotic heart disease of native coronary artery without angina pectoris: Secondary | ICD-10-CM | POA: Diagnosis not present

## 2015-11-30 DIAGNOSIS — B964 Proteus (mirabilis) (morganii) as the cause of diseases classified elsewhere: Secondary | ICD-10-CM | POA: Diagnosis not present

## 2015-12-03 DIAGNOSIS — A419 Sepsis, unspecified organism: Secondary | ICD-10-CM | POA: Diagnosis not present

## 2015-12-03 DIAGNOSIS — B964 Proteus (mirabilis) (morganii) as the cause of diseases classified elsewhere: Secondary | ICD-10-CM | POA: Diagnosis not present

## 2015-12-03 DIAGNOSIS — N390 Urinary tract infection, site not specified: Secondary | ICD-10-CM | POA: Diagnosis not present

## 2015-12-03 DIAGNOSIS — I129 Hypertensive chronic kidney disease with stage 1 through stage 4 chronic kidney disease, or unspecified chronic kidney disease: Secondary | ICD-10-CM | POA: Diagnosis not present

## 2015-12-03 DIAGNOSIS — I2510 Atherosclerotic heart disease of native coronary artery without angina pectoris: Secondary | ICD-10-CM | POA: Diagnosis not present

## 2015-12-03 DIAGNOSIS — J189 Pneumonia, unspecified organism: Secondary | ICD-10-CM | POA: Diagnosis not present

## 2015-12-04 DIAGNOSIS — B964 Proteus (mirabilis) (morganii) as the cause of diseases classified elsewhere: Secondary | ICD-10-CM | POA: Diagnosis not present

## 2015-12-04 DIAGNOSIS — J189 Pneumonia, unspecified organism: Secondary | ICD-10-CM | POA: Diagnosis not present

## 2015-12-04 DIAGNOSIS — A419 Sepsis, unspecified organism: Secondary | ICD-10-CM | POA: Diagnosis not present

## 2015-12-04 DIAGNOSIS — I129 Hypertensive chronic kidney disease with stage 1 through stage 4 chronic kidney disease, or unspecified chronic kidney disease: Secondary | ICD-10-CM | POA: Diagnosis not present

## 2015-12-04 DIAGNOSIS — N390 Urinary tract infection, site not specified: Secondary | ICD-10-CM | POA: Diagnosis not present

## 2015-12-04 DIAGNOSIS — I2510 Atherosclerotic heart disease of native coronary artery without angina pectoris: Secondary | ICD-10-CM | POA: Diagnosis not present

## 2015-12-05 DIAGNOSIS — B964 Proteus (mirabilis) (morganii) as the cause of diseases classified elsewhere: Secondary | ICD-10-CM | POA: Diagnosis not present

## 2015-12-05 DIAGNOSIS — A419 Sepsis, unspecified organism: Secondary | ICD-10-CM | POA: Diagnosis not present

## 2015-12-05 DIAGNOSIS — N390 Urinary tract infection, site not specified: Secondary | ICD-10-CM | POA: Diagnosis not present

## 2015-12-05 DIAGNOSIS — J189 Pneumonia, unspecified organism: Secondary | ICD-10-CM | POA: Diagnosis not present

## 2015-12-05 DIAGNOSIS — I129 Hypertensive chronic kidney disease with stage 1 through stage 4 chronic kidney disease, or unspecified chronic kidney disease: Secondary | ICD-10-CM | POA: Diagnosis not present

## 2015-12-05 DIAGNOSIS — I2510 Atherosclerotic heart disease of native coronary artery without angina pectoris: Secondary | ICD-10-CM | POA: Diagnosis not present

## 2015-12-06 DIAGNOSIS — B964 Proteus (mirabilis) (morganii) as the cause of diseases classified elsewhere: Secondary | ICD-10-CM | POA: Diagnosis not present

## 2015-12-06 DIAGNOSIS — A419 Sepsis, unspecified organism: Secondary | ICD-10-CM | POA: Diagnosis not present

## 2015-12-06 DIAGNOSIS — I2510 Atherosclerotic heart disease of native coronary artery without angina pectoris: Secondary | ICD-10-CM | POA: Diagnosis not present

## 2015-12-06 DIAGNOSIS — J189 Pneumonia, unspecified organism: Secondary | ICD-10-CM | POA: Diagnosis not present

## 2015-12-06 DIAGNOSIS — N390 Urinary tract infection, site not specified: Secondary | ICD-10-CM | POA: Diagnosis not present

## 2015-12-06 DIAGNOSIS — I129 Hypertensive chronic kidney disease with stage 1 through stage 4 chronic kidney disease, or unspecified chronic kidney disease: Secondary | ICD-10-CM | POA: Diagnosis not present

## 2015-12-07 DIAGNOSIS — K5190 Ulcerative colitis, unspecified, without complications: Secondary | ICD-10-CM | POA: Diagnosis not present

## 2015-12-07 DIAGNOSIS — J189 Pneumonia, unspecified organism: Secondary | ICD-10-CM | POA: Diagnosis not present

## 2015-12-07 DIAGNOSIS — Z9049 Acquired absence of other specified parts of digestive tract: Secondary | ICD-10-CM | POA: Diagnosis not present

## 2015-12-07 DIAGNOSIS — B964 Proteus (mirabilis) (morganii) as the cause of diseases classified elsewhere: Secondary | ICD-10-CM | POA: Diagnosis not present

## 2015-12-07 DIAGNOSIS — Z8551 Personal history of malignant neoplasm of bladder: Secondary | ICD-10-CM | POA: Diagnosis not present

## 2015-12-07 DIAGNOSIS — N390 Urinary tract infection, site not specified: Secondary | ICD-10-CM | POA: Diagnosis not present

## 2015-12-07 DIAGNOSIS — Z85038 Personal history of other malignant neoplasm of large intestine: Secondary | ICD-10-CM | POA: Diagnosis not present

## 2015-12-07 DIAGNOSIS — F0390 Unspecified dementia without behavioral disturbance: Secondary | ICD-10-CM | POA: Diagnosis not present

## 2015-12-07 DIAGNOSIS — Z8546 Personal history of malignant neoplasm of prostate: Secondary | ICD-10-CM | POA: Diagnosis not present

## 2015-12-07 DIAGNOSIS — A419 Sepsis, unspecified organism: Secondary | ICD-10-CM | POA: Diagnosis not present

## 2015-12-07 DIAGNOSIS — F419 Anxiety disorder, unspecified: Secondary | ICD-10-CM | POA: Diagnosis not present

## 2015-12-07 DIAGNOSIS — K5790 Diverticulosis of intestine, part unspecified, without perforation or abscess without bleeding: Secondary | ICD-10-CM | POA: Diagnosis not present

## 2015-12-07 DIAGNOSIS — N183 Chronic kidney disease, stage 3 (moderate): Secondary | ICD-10-CM | POA: Diagnosis not present

## 2015-12-07 DIAGNOSIS — Z87891 Personal history of nicotine dependence: Secondary | ICD-10-CM | POA: Diagnosis not present

## 2015-12-07 DIAGNOSIS — M19019 Primary osteoarthritis, unspecified shoulder: Secondary | ICD-10-CM | POA: Diagnosis not present

## 2015-12-07 DIAGNOSIS — I129 Hypertensive chronic kidney disease with stage 1 through stage 4 chronic kidney disease, or unspecified chronic kidney disease: Secondary | ICD-10-CM | POA: Diagnosis not present

## 2015-12-07 DIAGNOSIS — I2510 Atherosclerotic heart disease of native coronary artery without angina pectoris: Secondary | ICD-10-CM | POA: Diagnosis not present

## 2015-12-07 DIAGNOSIS — M109 Gout, unspecified: Secondary | ICD-10-CM | POA: Diagnosis not present

## 2015-12-07 DIAGNOSIS — Z951 Presence of aortocoronary bypass graft: Secondary | ICD-10-CM | POA: Diagnosis not present

## 2015-12-07 DIAGNOSIS — K222 Esophageal obstruction: Secondary | ICD-10-CM | POA: Diagnosis not present

## 2015-12-07 DIAGNOSIS — E7800 Pure hypercholesterolemia, unspecified: Secondary | ICD-10-CM | POA: Diagnosis not present

## 2015-12-07 DIAGNOSIS — S81802D Unspecified open wound, left lower leg, subsequent encounter: Secondary | ICD-10-CM | POA: Diagnosis not present

## 2015-12-10 DIAGNOSIS — R339 Retention of urine, unspecified: Secondary | ICD-10-CM | POA: Diagnosis not present

## 2015-12-12 DIAGNOSIS — I951 Orthostatic hypotension: Secondary | ICD-10-CM

## 2015-12-12 DIAGNOSIS — F39 Unspecified mood [affective] disorder: Secondary | ICD-10-CM

## 2015-12-12 DIAGNOSIS — N401 Enlarged prostate with lower urinary tract symptoms: Secondary | ICD-10-CM

## 2015-12-12 DIAGNOSIS — F062 Psychotic disorder with delusions due to known physiological condition: Secondary | ICD-10-CM

## 2015-12-12 DIAGNOSIS — M109 Gout, unspecified: Secondary | ICD-10-CM

## 2015-12-12 DIAGNOSIS — K5190 Ulcerative colitis, unspecified, without complications: Secondary | ICD-10-CM

## 2015-12-12 DIAGNOSIS — K219 Gastro-esophageal reflux disease without esophagitis: Secondary | ICD-10-CM

## 2015-12-12 DIAGNOSIS — F0150 Vascular dementia without behavioral disturbance: Secondary | ICD-10-CM

## 2016-01-07 DIAGNOSIS — F0150 Vascular dementia without behavioral disturbance: Secondary | ICD-10-CM | POA: Diagnosis not present

## 2016-01-07 DIAGNOSIS — K5190 Ulcerative colitis, unspecified, without complications: Secondary | ICD-10-CM | POA: Diagnosis not present

## 2016-01-07 DIAGNOSIS — K5790 Diverticulosis of intestine, part unspecified, without perforation or abscess without bleeding: Secondary | ICD-10-CM | POA: Diagnosis not present

## 2016-01-07 DIAGNOSIS — M19019 Primary osteoarthritis, unspecified shoulder: Secondary | ICD-10-CM | POA: Diagnosis not present

## 2016-01-07 DIAGNOSIS — Z951 Presence of aortocoronary bypass graft: Secondary | ICD-10-CM | POA: Diagnosis not present

## 2016-01-07 DIAGNOSIS — K222 Esophageal obstruction: Secondary | ICD-10-CM | POA: Diagnosis not present

## 2016-01-07 DIAGNOSIS — F419 Anxiety disorder, unspecified: Secondary | ICD-10-CM | POA: Diagnosis not present

## 2016-01-07 DIAGNOSIS — M109 Gout, unspecified: Secondary | ICD-10-CM | POA: Diagnosis not present

## 2016-01-07 DIAGNOSIS — I129 Hypertensive chronic kidney disease with stage 1 through stage 4 chronic kidney disease, or unspecified chronic kidney disease: Secondary | ICD-10-CM | POA: Diagnosis not present

## 2016-01-07 DIAGNOSIS — I679 Cerebrovascular disease, unspecified: Secondary | ICD-10-CM | POA: Diagnosis not present

## 2016-01-07 DIAGNOSIS — Z8551 Personal history of malignant neoplasm of bladder: Secondary | ICD-10-CM | POA: Diagnosis not present

## 2016-01-07 DIAGNOSIS — E7800 Pure hypercholesterolemia, unspecified: Secondary | ICD-10-CM | POA: Diagnosis not present

## 2016-01-07 DIAGNOSIS — N183 Chronic kidney disease, stage 3 (moderate): Secondary | ICD-10-CM | POA: Diagnosis not present

## 2016-01-07 DIAGNOSIS — Z8546 Personal history of malignant neoplasm of prostate: Secondary | ICD-10-CM | POA: Diagnosis not present

## 2016-01-07 DIAGNOSIS — S81802D Unspecified open wound, left lower leg, subsequent encounter: Secondary | ICD-10-CM | POA: Diagnosis not present

## 2016-01-07 DIAGNOSIS — R634 Abnormal weight loss: Secondary | ICD-10-CM | POA: Diagnosis not present

## 2016-01-07 DIAGNOSIS — Z9049 Acquired absence of other specified parts of digestive tract: Secondary | ICD-10-CM | POA: Diagnosis not present

## 2016-01-07 DIAGNOSIS — I2510 Atherosclerotic heart disease of native coronary artery without angina pectoris: Secondary | ICD-10-CM | POA: Diagnosis not present

## 2016-01-07 DIAGNOSIS — Z87891 Personal history of nicotine dependence: Secondary | ICD-10-CM | POA: Diagnosis not present

## 2016-01-07 DIAGNOSIS — Z85038 Personal history of other malignant neoplasm of large intestine: Secondary | ICD-10-CM | POA: Diagnosis not present

## 2016-01-08 DIAGNOSIS — R634 Abnormal weight loss: Secondary | ICD-10-CM | POA: Diagnosis not present

## 2016-01-08 DIAGNOSIS — N183 Chronic kidney disease, stage 3 (moderate): Secondary | ICD-10-CM | POA: Diagnosis not present

## 2016-01-08 DIAGNOSIS — F0150 Vascular dementia without behavioral disturbance: Secondary | ICD-10-CM | POA: Diagnosis not present

## 2016-01-08 DIAGNOSIS — I679 Cerebrovascular disease, unspecified: Secondary | ICD-10-CM | POA: Diagnosis not present

## 2016-01-08 DIAGNOSIS — I2510 Atherosclerotic heart disease of native coronary artery without angina pectoris: Secondary | ICD-10-CM | POA: Diagnosis not present

## 2016-01-08 DIAGNOSIS — I129 Hypertensive chronic kidney disease with stage 1 through stage 4 chronic kidney disease, or unspecified chronic kidney disease: Secondary | ICD-10-CM | POA: Diagnosis not present

## 2016-01-09 DIAGNOSIS — F0150 Vascular dementia without behavioral disturbance: Secondary | ICD-10-CM | POA: Diagnosis not present

## 2016-01-09 DIAGNOSIS — I679 Cerebrovascular disease, unspecified: Secondary | ICD-10-CM | POA: Diagnosis not present

## 2016-01-09 DIAGNOSIS — I2510 Atherosclerotic heart disease of native coronary artery without angina pectoris: Secondary | ICD-10-CM | POA: Diagnosis not present

## 2016-01-09 DIAGNOSIS — N183 Chronic kidney disease, stage 3 (moderate): Secondary | ICD-10-CM | POA: Diagnosis not present

## 2016-01-09 DIAGNOSIS — R634 Abnormal weight loss: Secondary | ICD-10-CM | POA: Diagnosis not present

## 2016-01-09 DIAGNOSIS — I129 Hypertensive chronic kidney disease with stage 1 through stage 4 chronic kidney disease, or unspecified chronic kidney disease: Secondary | ICD-10-CM | POA: Diagnosis not present

## 2016-01-10 DIAGNOSIS — N183 Chronic kidney disease, stage 3 (moderate): Secondary | ICD-10-CM | POA: Diagnosis not present

## 2016-01-10 DIAGNOSIS — I129 Hypertensive chronic kidney disease with stage 1 through stage 4 chronic kidney disease, or unspecified chronic kidney disease: Secondary | ICD-10-CM | POA: Diagnosis not present

## 2016-01-10 DIAGNOSIS — I679 Cerebrovascular disease, unspecified: Secondary | ICD-10-CM | POA: Diagnosis not present

## 2016-01-10 DIAGNOSIS — F0150 Vascular dementia without behavioral disturbance: Secondary | ICD-10-CM | POA: Diagnosis not present

## 2016-01-10 DIAGNOSIS — R634 Abnormal weight loss: Secondary | ICD-10-CM | POA: Diagnosis not present

## 2016-01-10 DIAGNOSIS — I2510 Atherosclerotic heart disease of native coronary artery without angina pectoris: Secondary | ICD-10-CM | POA: Diagnosis not present

## 2016-01-11 DIAGNOSIS — I679 Cerebrovascular disease, unspecified: Secondary | ICD-10-CM | POA: Diagnosis not present

## 2016-01-11 DIAGNOSIS — F0150 Vascular dementia without behavioral disturbance: Secondary | ICD-10-CM | POA: Diagnosis not present

## 2016-01-11 DIAGNOSIS — N183 Chronic kidney disease, stage 3 (moderate): Secondary | ICD-10-CM | POA: Diagnosis not present

## 2016-01-11 DIAGNOSIS — I129 Hypertensive chronic kidney disease with stage 1 through stage 4 chronic kidney disease, or unspecified chronic kidney disease: Secondary | ICD-10-CM | POA: Diagnosis not present

## 2016-01-11 DIAGNOSIS — R634 Abnormal weight loss: Secondary | ICD-10-CM | POA: Diagnosis not present

## 2016-01-11 DIAGNOSIS — I2510 Atherosclerotic heart disease of native coronary artery without angina pectoris: Secondary | ICD-10-CM | POA: Diagnosis not present

## 2016-01-14 DIAGNOSIS — F0150 Vascular dementia without behavioral disturbance: Secondary | ICD-10-CM | POA: Diagnosis not present

## 2016-01-14 DIAGNOSIS — I679 Cerebrovascular disease, unspecified: Secondary | ICD-10-CM | POA: Diagnosis not present

## 2016-01-14 DIAGNOSIS — I2510 Atherosclerotic heart disease of native coronary artery without angina pectoris: Secondary | ICD-10-CM | POA: Diagnosis not present

## 2016-01-14 DIAGNOSIS — R634 Abnormal weight loss: Secondary | ICD-10-CM | POA: Diagnosis not present

## 2016-01-14 DIAGNOSIS — N183 Chronic kidney disease, stage 3 (moderate): Secondary | ICD-10-CM | POA: Diagnosis not present

## 2016-01-14 DIAGNOSIS — I129 Hypertensive chronic kidney disease with stage 1 through stage 4 chronic kidney disease, or unspecified chronic kidney disease: Secondary | ICD-10-CM | POA: Diagnosis not present

## 2016-01-15 DIAGNOSIS — I679 Cerebrovascular disease, unspecified: Secondary | ICD-10-CM | POA: Diagnosis not present

## 2016-01-15 DIAGNOSIS — R634 Abnormal weight loss: Secondary | ICD-10-CM | POA: Diagnosis not present

## 2016-01-15 DIAGNOSIS — N183 Chronic kidney disease, stage 3 (moderate): Secondary | ICD-10-CM | POA: Diagnosis not present

## 2016-01-15 DIAGNOSIS — F0150 Vascular dementia without behavioral disturbance: Secondary | ICD-10-CM | POA: Diagnosis not present

## 2016-01-15 DIAGNOSIS — I129 Hypertensive chronic kidney disease with stage 1 through stage 4 chronic kidney disease, or unspecified chronic kidney disease: Secondary | ICD-10-CM | POA: Diagnosis not present

## 2016-01-15 DIAGNOSIS — I2510 Atherosclerotic heart disease of native coronary artery without angina pectoris: Secondary | ICD-10-CM | POA: Diagnosis not present

## 2016-01-16 DIAGNOSIS — F0150 Vascular dementia without behavioral disturbance: Secondary | ICD-10-CM | POA: Diagnosis not present

## 2016-01-16 DIAGNOSIS — R634 Abnormal weight loss: Secondary | ICD-10-CM | POA: Diagnosis not present

## 2016-01-16 DIAGNOSIS — I679 Cerebrovascular disease, unspecified: Secondary | ICD-10-CM | POA: Diagnosis not present

## 2016-01-16 DIAGNOSIS — I129 Hypertensive chronic kidney disease with stage 1 through stage 4 chronic kidney disease, or unspecified chronic kidney disease: Secondary | ICD-10-CM | POA: Diagnosis not present

## 2016-01-16 DIAGNOSIS — N183 Chronic kidney disease, stage 3 (moderate): Secondary | ICD-10-CM | POA: Diagnosis not present

## 2016-01-16 DIAGNOSIS — I2510 Atherosclerotic heart disease of native coronary artery without angina pectoris: Secondary | ICD-10-CM | POA: Diagnosis not present

## 2016-01-17 DIAGNOSIS — I679 Cerebrovascular disease, unspecified: Secondary | ICD-10-CM | POA: Diagnosis not present

## 2016-01-17 DIAGNOSIS — N183 Chronic kidney disease, stage 3 (moderate): Secondary | ICD-10-CM | POA: Diagnosis not present

## 2016-01-17 DIAGNOSIS — R634 Abnormal weight loss: Secondary | ICD-10-CM | POA: Diagnosis not present

## 2016-01-17 DIAGNOSIS — I2510 Atherosclerotic heart disease of native coronary artery without angina pectoris: Secondary | ICD-10-CM | POA: Diagnosis not present

## 2016-01-17 DIAGNOSIS — F0150 Vascular dementia without behavioral disturbance: Secondary | ICD-10-CM | POA: Diagnosis not present

## 2016-01-17 DIAGNOSIS — I129 Hypertensive chronic kidney disease with stage 1 through stage 4 chronic kidney disease, or unspecified chronic kidney disease: Secondary | ICD-10-CM | POA: Diagnosis not present

## 2016-01-18 DIAGNOSIS — I129 Hypertensive chronic kidney disease with stage 1 through stage 4 chronic kidney disease, or unspecified chronic kidney disease: Secondary | ICD-10-CM | POA: Diagnosis not present

## 2016-01-18 DIAGNOSIS — I679 Cerebrovascular disease, unspecified: Secondary | ICD-10-CM | POA: Diagnosis not present

## 2016-01-18 DIAGNOSIS — F0150 Vascular dementia without behavioral disturbance: Secondary | ICD-10-CM | POA: Diagnosis not present

## 2016-01-18 DIAGNOSIS — R634 Abnormal weight loss: Secondary | ICD-10-CM | POA: Diagnosis not present

## 2016-01-18 DIAGNOSIS — N183 Chronic kidney disease, stage 3 (moderate): Secondary | ICD-10-CM | POA: Diagnosis not present

## 2016-01-18 DIAGNOSIS — I2510 Atherosclerotic heart disease of native coronary artery without angina pectoris: Secondary | ICD-10-CM | POA: Diagnosis not present

## 2016-01-21 DIAGNOSIS — R634 Abnormal weight loss: Secondary | ICD-10-CM | POA: Diagnosis not present

## 2016-01-21 DIAGNOSIS — F0150 Vascular dementia without behavioral disturbance: Secondary | ICD-10-CM | POA: Diagnosis not present

## 2016-01-21 DIAGNOSIS — I129 Hypertensive chronic kidney disease with stage 1 through stage 4 chronic kidney disease, or unspecified chronic kidney disease: Secondary | ICD-10-CM | POA: Diagnosis not present

## 2016-01-21 DIAGNOSIS — N183 Chronic kidney disease, stage 3 (moderate): Secondary | ICD-10-CM | POA: Diagnosis not present

## 2016-01-21 DIAGNOSIS — I679 Cerebrovascular disease, unspecified: Secondary | ICD-10-CM | POA: Diagnosis not present

## 2016-01-21 DIAGNOSIS — I2510 Atherosclerotic heart disease of native coronary artery without angina pectoris: Secondary | ICD-10-CM | POA: Diagnosis not present

## 2016-01-22 DIAGNOSIS — I679 Cerebrovascular disease, unspecified: Secondary | ICD-10-CM | POA: Diagnosis not present

## 2016-01-22 DIAGNOSIS — I129 Hypertensive chronic kidney disease with stage 1 through stage 4 chronic kidney disease, or unspecified chronic kidney disease: Secondary | ICD-10-CM | POA: Diagnosis not present

## 2016-01-22 DIAGNOSIS — F0150 Vascular dementia without behavioral disturbance: Secondary | ICD-10-CM | POA: Diagnosis not present

## 2016-01-22 DIAGNOSIS — I2510 Atherosclerotic heart disease of native coronary artery without angina pectoris: Secondary | ICD-10-CM | POA: Diagnosis not present

## 2016-01-22 DIAGNOSIS — R634 Abnormal weight loss: Secondary | ICD-10-CM | POA: Diagnosis not present

## 2016-01-22 DIAGNOSIS — N183 Chronic kidney disease, stage 3 (moderate): Secondary | ICD-10-CM | POA: Diagnosis not present

## 2016-01-23 DIAGNOSIS — I129 Hypertensive chronic kidney disease with stage 1 through stage 4 chronic kidney disease, or unspecified chronic kidney disease: Secondary | ICD-10-CM | POA: Diagnosis not present

## 2016-01-23 DIAGNOSIS — N183 Chronic kidney disease, stage 3 (moderate): Secondary | ICD-10-CM | POA: Diagnosis not present

## 2016-01-23 DIAGNOSIS — I2510 Atherosclerotic heart disease of native coronary artery without angina pectoris: Secondary | ICD-10-CM | POA: Diagnosis not present

## 2016-01-23 DIAGNOSIS — R634 Abnormal weight loss: Secondary | ICD-10-CM | POA: Diagnosis not present

## 2016-01-23 DIAGNOSIS — F0150 Vascular dementia without behavioral disturbance: Secondary | ICD-10-CM | POA: Diagnosis not present

## 2016-01-23 DIAGNOSIS — I679 Cerebrovascular disease, unspecified: Secondary | ICD-10-CM | POA: Diagnosis not present

## 2016-01-25 DIAGNOSIS — I2510 Atherosclerotic heart disease of native coronary artery without angina pectoris: Secondary | ICD-10-CM | POA: Diagnosis not present

## 2016-01-25 DIAGNOSIS — I679 Cerebrovascular disease, unspecified: Secondary | ICD-10-CM | POA: Diagnosis not present

## 2016-01-25 DIAGNOSIS — N183 Chronic kidney disease, stage 3 (moderate): Secondary | ICD-10-CM | POA: Diagnosis not present

## 2016-01-25 DIAGNOSIS — R634 Abnormal weight loss: Secondary | ICD-10-CM | POA: Diagnosis not present

## 2016-01-25 DIAGNOSIS — I129 Hypertensive chronic kidney disease with stage 1 through stage 4 chronic kidney disease, or unspecified chronic kidney disease: Secondary | ICD-10-CM | POA: Diagnosis not present

## 2016-01-25 DIAGNOSIS — F0150 Vascular dementia without behavioral disturbance: Secondary | ICD-10-CM | POA: Diagnosis not present

## 2016-01-28 DIAGNOSIS — N183 Chronic kidney disease, stage 3 (moderate): Secondary | ICD-10-CM | POA: Diagnosis not present

## 2016-01-28 DIAGNOSIS — F0150 Vascular dementia without behavioral disturbance: Secondary | ICD-10-CM | POA: Diagnosis not present

## 2016-01-28 DIAGNOSIS — R634 Abnormal weight loss: Secondary | ICD-10-CM | POA: Diagnosis not present

## 2016-01-28 DIAGNOSIS — I2510 Atherosclerotic heart disease of native coronary artery without angina pectoris: Secondary | ICD-10-CM | POA: Diagnosis not present

## 2016-01-28 DIAGNOSIS — I679 Cerebrovascular disease, unspecified: Secondary | ICD-10-CM | POA: Diagnosis not present

## 2016-01-28 DIAGNOSIS — I129 Hypertensive chronic kidney disease with stage 1 through stage 4 chronic kidney disease, or unspecified chronic kidney disease: Secondary | ICD-10-CM | POA: Diagnosis not present

## 2016-01-29 DIAGNOSIS — N183 Chronic kidney disease, stage 3 (moderate): Secondary | ICD-10-CM | POA: Diagnosis not present

## 2016-01-29 DIAGNOSIS — I679 Cerebrovascular disease, unspecified: Secondary | ICD-10-CM | POA: Diagnosis not present

## 2016-01-29 DIAGNOSIS — I2510 Atherosclerotic heart disease of native coronary artery without angina pectoris: Secondary | ICD-10-CM | POA: Diagnosis not present

## 2016-01-29 DIAGNOSIS — I129 Hypertensive chronic kidney disease with stage 1 through stage 4 chronic kidney disease, or unspecified chronic kidney disease: Secondary | ICD-10-CM | POA: Diagnosis not present

## 2016-01-29 DIAGNOSIS — R634 Abnormal weight loss: Secondary | ICD-10-CM | POA: Diagnosis not present

## 2016-01-29 DIAGNOSIS — F0150 Vascular dementia without behavioral disturbance: Secondary | ICD-10-CM | POA: Diagnosis not present

## 2016-01-29 DIAGNOSIS — J181 Lobar pneumonia, unspecified organism: Secondary | ICD-10-CM | POA: Diagnosis not present

## 2016-01-30 DIAGNOSIS — R634 Abnormal weight loss: Secondary | ICD-10-CM | POA: Diagnosis not present

## 2016-01-30 DIAGNOSIS — N183 Chronic kidney disease, stage 3 (moderate): Secondary | ICD-10-CM | POA: Diagnosis not present

## 2016-01-30 DIAGNOSIS — I129 Hypertensive chronic kidney disease with stage 1 through stage 4 chronic kidney disease, or unspecified chronic kidney disease: Secondary | ICD-10-CM | POA: Diagnosis not present

## 2016-01-30 DIAGNOSIS — I2510 Atherosclerotic heart disease of native coronary artery without angina pectoris: Secondary | ICD-10-CM | POA: Diagnosis not present

## 2016-01-30 DIAGNOSIS — R05 Cough: Secondary | ICD-10-CM | POA: Diagnosis not present

## 2016-01-30 DIAGNOSIS — F0150 Vascular dementia without behavioral disturbance: Secondary | ICD-10-CM | POA: Diagnosis not present

## 2016-01-30 DIAGNOSIS — I679 Cerebrovascular disease, unspecified: Secondary | ICD-10-CM | POA: Diagnosis not present

## 2016-01-31 DIAGNOSIS — N183 Chronic kidney disease, stage 3 (moderate): Secondary | ICD-10-CM | POA: Diagnosis not present

## 2016-01-31 DIAGNOSIS — I129 Hypertensive chronic kidney disease with stage 1 through stage 4 chronic kidney disease, or unspecified chronic kidney disease: Secondary | ICD-10-CM | POA: Diagnosis not present

## 2016-01-31 DIAGNOSIS — R634 Abnormal weight loss: Secondary | ICD-10-CM | POA: Diagnosis not present

## 2016-01-31 DIAGNOSIS — F0150 Vascular dementia without behavioral disturbance: Secondary | ICD-10-CM | POA: Diagnosis not present

## 2016-01-31 DIAGNOSIS — I679 Cerebrovascular disease, unspecified: Secondary | ICD-10-CM | POA: Diagnosis not present

## 2016-01-31 DIAGNOSIS — I2510 Atherosclerotic heart disease of native coronary artery without angina pectoris: Secondary | ICD-10-CM | POA: Diagnosis not present

## 2016-02-01 DIAGNOSIS — R634 Abnormal weight loss: Secondary | ICD-10-CM | POA: Diagnosis not present

## 2016-02-01 DIAGNOSIS — I679 Cerebrovascular disease, unspecified: Secondary | ICD-10-CM | POA: Diagnosis not present

## 2016-02-01 DIAGNOSIS — F0150 Vascular dementia without behavioral disturbance: Secondary | ICD-10-CM | POA: Diagnosis not present

## 2016-02-01 DIAGNOSIS — N183 Chronic kidney disease, stage 3 (moderate): Secondary | ICD-10-CM | POA: Diagnosis not present

## 2016-02-01 DIAGNOSIS — I129 Hypertensive chronic kidney disease with stage 1 through stage 4 chronic kidney disease, or unspecified chronic kidney disease: Secondary | ICD-10-CM | POA: Diagnosis not present

## 2016-02-01 DIAGNOSIS — I2510 Atherosclerotic heart disease of native coronary artery without angina pectoris: Secondary | ICD-10-CM | POA: Diagnosis not present

## 2016-02-04 DIAGNOSIS — I679 Cerebrovascular disease, unspecified: Secondary | ICD-10-CM | POA: Diagnosis not present

## 2016-02-04 DIAGNOSIS — I2510 Atherosclerotic heart disease of native coronary artery without angina pectoris: Secondary | ICD-10-CM | POA: Diagnosis not present

## 2016-02-04 DIAGNOSIS — F0150 Vascular dementia without behavioral disturbance: Secondary | ICD-10-CM | POA: Diagnosis not present

## 2016-02-04 DIAGNOSIS — R634 Abnormal weight loss: Secondary | ICD-10-CM | POA: Diagnosis not present

## 2016-02-04 DIAGNOSIS — I129 Hypertensive chronic kidney disease with stage 1 through stage 4 chronic kidney disease, or unspecified chronic kidney disease: Secondary | ICD-10-CM | POA: Diagnosis not present

## 2016-02-04 DIAGNOSIS — N183 Chronic kidney disease, stage 3 (moderate): Secondary | ICD-10-CM | POA: Diagnosis not present

## 2016-02-05 DIAGNOSIS — R634 Abnormal weight loss: Secondary | ICD-10-CM | POA: Diagnosis not present

## 2016-02-05 DIAGNOSIS — F0150 Vascular dementia without behavioral disturbance: Secondary | ICD-10-CM | POA: Diagnosis not present

## 2016-02-05 DIAGNOSIS — I129 Hypertensive chronic kidney disease with stage 1 through stage 4 chronic kidney disease, or unspecified chronic kidney disease: Secondary | ICD-10-CM | POA: Diagnosis not present

## 2016-02-05 DIAGNOSIS — I679 Cerebrovascular disease, unspecified: Secondary | ICD-10-CM | POA: Diagnosis not present

## 2016-02-05 DIAGNOSIS — I2510 Atherosclerotic heart disease of native coronary artery without angina pectoris: Secondary | ICD-10-CM | POA: Diagnosis not present

## 2016-02-05 DIAGNOSIS — N183 Chronic kidney disease, stage 3 (moderate): Secondary | ICD-10-CM | POA: Diagnosis not present

## 2016-02-06 DIAGNOSIS — N183 Chronic kidney disease, stage 3 (moderate): Secondary | ICD-10-CM | POA: Diagnosis not present

## 2016-02-06 DIAGNOSIS — R634 Abnormal weight loss: Secondary | ICD-10-CM | POA: Diagnosis not present

## 2016-02-06 DIAGNOSIS — I679 Cerebrovascular disease, unspecified: Secondary | ICD-10-CM | POA: Diagnosis not present

## 2016-02-06 DIAGNOSIS — F0150 Vascular dementia without behavioral disturbance: Secondary | ICD-10-CM | POA: Diagnosis not present

## 2016-02-06 DIAGNOSIS — I129 Hypertensive chronic kidney disease with stage 1 through stage 4 chronic kidney disease, or unspecified chronic kidney disease: Secondary | ICD-10-CM | POA: Diagnosis not present

## 2016-02-06 DIAGNOSIS — I2510 Atherosclerotic heart disease of native coronary artery without angina pectoris: Secondary | ICD-10-CM | POA: Diagnosis not present

## 2016-02-07 DIAGNOSIS — I2510 Atherosclerotic heart disease of native coronary artery without angina pectoris: Secondary | ICD-10-CM | POA: Diagnosis not present

## 2016-02-07 DIAGNOSIS — Z8551 Personal history of malignant neoplasm of bladder: Secondary | ICD-10-CM | POA: Diagnosis not present

## 2016-02-07 DIAGNOSIS — K222 Esophageal obstruction: Secondary | ICD-10-CM | POA: Diagnosis not present

## 2016-02-07 DIAGNOSIS — K5790 Diverticulosis of intestine, part unspecified, without perforation or abscess without bleeding: Secondary | ICD-10-CM | POA: Diagnosis not present

## 2016-02-07 DIAGNOSIS — L89312 Pressure ulcer of right buttock, stage 2: Secondary | ICD-10-CM | POA: Diagnosis not present

## 2016-02-07 DIAGNOSIS — M19019 Primary osteoarthritis, unspecified shoulder: Secondary | ICD-10-CM | POA: Diagnosis not present

## 2016-02-07 DIAGNOSIS — F419 Anxiety disorder, unspecified: Secondary | ICD-10-CM | POA: Diagnosis not present

## 2016-02-07 DIAGNOSIS — Z951 Presence of aortocoronary bypass graft: Secondary | ICD-10-CM | POA: Diagnosis not present

## 2016-02-07 DIAGNOSIS — F0150 Vascular dementia without behavioral disturbance: Secondary | ICD-10-CM | POA: Diagnosis not present

## 2016-02-07 DIAGNOSIS — Z9049 Acquired absence of other specified parts of digestive tract: Secondary | ICD-10-CM | POA: Diagnosis not present

## 2016-02-07 DIAGNOSIS — I129 Hypertensive chronic kidney disease with stage 1 through stage 4 chronic kidney disease, or unspecified chronic kidney disease: Secondary | ICD-10-CM | POA: Diagnosis not present

## 2016-02-07 DIAGNOSIS — Z6822 Body mass index (BMI) 22.0-22.9, adult: Secondary | ICD-10-CM | POA: Diagnosis not present

## 2016-02-07 DIAGNOSIS — Z85038 Personal history of other malignant neoplasm of large intestine: Secondary | ICD-10-CM | POA: Diagnosis not present

## 2016-02-07 DIAGNOSIS — Z8546 Personal history of malignant neoplasm of prostate: Secondary | ICD-10-CM | POA: Diagnosis not present

## 2016-02-07 DIAGNOSIS — K5190 Ulcerative colitis, unspecified, without complications: Secondary | ICD-10-CM | POA: Diagnosis not present

## 2016-02-07 DIAGNOSIS — I679 Cerebrovascular disease, unspecified: Secondary | ICD-10-CM | POA: Diagnosis not present

## 2016-02-07 DIAGNOSIS — R634 Abnormal weight loss: Secondary | ICD-10-CM | POA: Diagnosis not present

## 2016-02-07 DIAGNOSIS — M109 Gout, unspecified: Secondary | ICD-10-CM | POA: Diagnosis not present

## 2016-02-07 DIAGNOSIS — L89322 Pressure ulcer of left buttock, stage 2: Secondary | ICD-10-CM | POA: Diagnosis not present

## 2016-02-07 DIAGNOSIS — E7800 Pure hypercholesterolemia, unspecified: Secondary | ICD-10-CM | POA: Diagnosis not present

## 2016-02-07 DIAGNOSIS — N183 Chronic kidney disease, stage 3 (moderate): Secondary | ICD-10-CM | POA: Diagnosis not present

## 2016-02-07 DIAGNOSIS — Z87891 Personal history of nicotine dependence: Secondary | ICD-10-CM | POA: Diagnosis not present

## 2016-02-07 DIAGNOSIS — J189 Pneumonia, unspecified organism: Secondary | ICD-10-CM | POA: Diagnosis not present

## 2016-02-08 DIAGNOSIS — F0150 Vascular dementia without behavioral disturbance: Secondary | ICD-10-CM | POA: Diagnosis not present

## 2016-02-08 DIAGNOSIS — I2510 Atherosclerotic heart disease of native coronary artery without angina pectoris: Secondary | ICD-10-CM | POA: Diagnosis not present

## 2016-02-08 DIAGNOSIS — N183 Chronic kidney disease, stage 3 (moderate): Secondary | ICD-10-CM | POA: Diagnosis not present

## 2016-02-08 DIAGNOSIS — I129 Hypertensive chronic kidney disease with stage 1 through stage 4 chronic kidney disease, or unspecified chronic kidney disease: Secondary | ICD-10-CM | POA: Diagnosis not present

## 2016-02-08 DIAGNOSIS — I679 Cerebrovascular disease, unspecified: Secondary | ICD-10-CM | POA: Diagnosis not present

## 2016-02-08 DIAGNOSIS — R634 Abnormal weight loss: Secondary | ICD-10-CM | POA: Diagnosis not present

## 2016-02-11 DIAGNOSIS — I679 Cerebrovascular disease, unspecified: Secondary | ICD-10-CM | POA: Diagnosis not present

## 2016-02-11 DIAGNOSIS — I129 Hypertensive chronic kidney disease with stage 1 through stage 4 chronic kidney disease, or unspecified chronic kidney disease: Secondary | ICD-10-CM | POA: Diagnosis not present

## 2016-02-11 DIAGNOSIS — N183 Chronic kidney disease, stage 3 (moderate): Secondary | ICD-10-CM | POA: Diagnosis not present

## 2016-02-11 DIAGNOSIS — R634 Abnormal weight loss: Secondary | ICD-10-CM | POA: Diagnosis not present

## 2016-02-11 DIAGNOSIS — F0150 Vascular dementia without behavioral disturbance: Secondary | ICD-10-CM | POA: Diagnosis not present

## 2016-02-11 DIAGNOSIS — I2510 Atherosclerotic heart disease of native coronary artery without angina pectoris: Secondary | ICD-10-CM | POA: Diagnosis not present

## 2016-02-12 DIAGNOSIS — N183 Chronic kidney disease, stage 3 (moderate): Secondary | ICD-10-CM | POA: Diagnosis not present

## 2016-02-12 DIAGNOSIS — I129 Hypertensive chronic kidney disease with stage 1 through stage 4 chronic kidney disease, or unspecified chronic kidney disease: Secondary | ICD-10-CM | POA: Diagnosis not present

## 2016-02-12 DIAGNOSIS — R634 Abnormal weight loss: Secondary | ICD-10-CM | POA: Diagnosis not present

## 2016-02-12 DIAGNOSIS — F0150 Vascular dementia without behavioral disturbance: Secondary | ICD-10-CM | POA: Diagnosis not present

## 2016-02-12 DIAGNOSIS — I2510 Atherosclerotic heart disease of native coronary artery without angina pectoris: Secondary | ICD-10-CM | POA: Diagnosis not present

## 2016-02-12 DIAGNOSIS — I679 Cerebrovascular disease, unspecified: Secondary | ICD-10-CM | POA: Diagnosis not present

## 2016-02-13 DIAGNOSIS — N183 Chronic kidney disease, stage 3 (moderate): Secondary | ICD-10-CM | POA: Diagnosis not present

## 2016-02-13 DIAGNOSIS — R634 Abnormal weight loss: Secondary | ICD-10-CM | POA: Diagnosis not present

## 2016-02-13 DIAGNOSIS — F0150 Vascular dementia without behavioral disturbance: Secondary | ICD-10-CM | POA: Diagnosis not present

## 2016-02-13 DIAGNOSIS — I2510 Atherosclerotic heart disease of native coronary artery without angina pectoris: Secondary | ICD-10-CM | POA: Diagnosis not present

## 2016-02-13 DIAGNOSIS — I679 Cerebrovascular disease, unspecified: Secondary | ICD-10-CM | POA: Diagnosis not present

## 2016-02-13 DIAGNOSIS — I129 Hypertensive chronic kidney disease with stage 1 through stage 4 chronic kidney disease, or unspecified chronic kidney disease: Secondary | ICD-10-CM | POA: Diagnosis not present

## 2016-02-14 DIAGNOSIS — R634 Abnormal weight loss: Secondary | ICD-10-CM | POA: Diagnosis not present

## 2016-02-14 DIAGNOSIS — I2510 Atherosclerotic heart disease of native coronary artery without angina pectoris: Secondary | ICD-10-CM | POA: Diagnosis not present

## 2016-02-14 DIAGNOSIS — I679 Cerebrovascular disease, unspecified: Secondary | ICD-10-CM | POA: Diagnosis not present

## 2016-02-14 DIAGNOSIS — F0150 Vascular dementia without behavioral disturbance: Secondary | ICD-10-CM | POA: Diagnosis not present

## 2016-02-14 DIAGNOSIS — I129 Hypertensive chronic kidney disease with stage 1 through stage 4 chronic kidney disease, or unspecified chronic kidney disease: Secondary | ICD-10-CM | POA: Diagnosis not present

## 2016-02-14 DIAGNOSIS — N183 Chronic kidney disease, stage 3 (moderate): Secondary | ICD-10-CM | POA: Diagnosis not present

## 2016-02-15 DIAGNOSIS — I679 Cerebrovascular disease, unspecified: Secondary | ICD-10-CM | POA: Diagnosis not present

## 2016-02-15 DIAGNOSIS — N183 Chronic kidney disease, stage 3 (moderate): Secondary | ICD-10-CM | POA: Diagnosis not present

## 2016-02-15 DIAGNOSIS — F0150 Vascular dementia without behavioral disturbance: Secondary | ICD-10-CM | POA: Diagnosis not present

## 2016-02-15 DIAGNOSIS — I129 Hypertensive chronic kidney disease with stage 1 through stage 4 chronic kidney disease, or unspecified chronic kidney disease: Secondary | ICD-10-CM | POA: Diagnosis not present

## 2016-02-15 DIAGNOSIS — R634 Abnormal weight loss: Secondary | ICD-10-CM | POA: Diagnosis not present

## 2016-02-15 DIAGNOSIS — I2510 Atherosclerotic heart disease of native coronary artery without angina pectoris: Secondary | ICD-10-CM | POA: Diagnosis not present

## 2016-02-18 DIAGNOSIS — N183 Chronic kidney disease, stage 3 (moderate): Secondary | ICD-10-CM | POA: Diagnosis not present

## 2016-02-18 DIAGNOSIS — I129 Hypertensive chronic kidney disease with stage 1 through stage 4 chronic kidney disease, or unspecified chronic kidney disease: Secondary | ICD-10-CM | POA: Diagnosis not present

## 2016-02-18 DIAGNOSIS — I2510 Atherosclerotic heart disease of native coronary artery without angina pectoris: Secondary | ICD-10-CM | POA: Diagnosis not present

## 2016-02-18 DIAGNOSIS — F0150 Vascular dementia without behavioral disturbance: Secondary | ICD-10-CM | POA: Diagnosis not present

## 2016-02-18 DIAGNOSIS — I679 Cerebrovascular disease, unspecified: Secondary | ICD-10-CM | POA: Diagnosis not present

## 2016-02-18 DIAGNOSIS — R634 Abnormal weight loss: Secondary | ICD-10-CM | POA: Diagnosis not present

## 2016-02-19 DIAGNOSIS — I129 Hypertensive chronic kidney disease with stage 1 through stage 4 chronic kidney disease, or unspecified chronic kidney disease: Secondary | ICD-10-CM | POA: Diagnosis not present

## 2016-02-19 DIAGNOSIS — I679 Cerebrovascular disease, unspecified: Secondary | ICD-10-CM | POA: Diagnosis not present

## 2016-02-19 DIAGNOSIS — F0150 Vascular dementia without behavioral disturbance: Secondary | ICD-10-CM | POA: Diagnosis not present

## 2016-02-19 DIAGNOSIS — I2510 Atherosclerotic heart disease of native coronary artery without angina pectoris: Secondary | ICD-10-CM | POA: Diagnosis not present

## 2016-02-19 DIAGNOSIS — N183 Chronic kidney disease, stage 3 (moderate): Secondary | ICD-10-CM | POA: Diagnosis not present

## 2016-02-19 DIAGNOSIS — R634 Abnormal weight loss: Secondary | ICD-10-CM | POA: Diagnosis not present

## 2016-02-20 DIAGNOSIS — N183 Chronic kidney disease, stage 3 (moderate): Secondary | ICD-10-CM | POA: Diagnosis not present

## 2016-02-20 DIAGNOSIS — R634 Abnormal weight loss: Secondary | ICD-10-CM | POA: Diagnosis not present

## 2016-02-20 DIAGNOSIS — F0150 Vascular dementia without behavioral disturbance: Secondary | ICD-10-CM | POA: Diagnosis not present

## 2016-02-20 DIAGNOSIS — I2510 Atherosclerotic heart disease of native coronary artery without angina pectoris: Secondary | ICD-10-CM | POA: Diagnosis not present

## 2016-02-20 DIAGNOSIS — I129 Hypertensive chronic kidney disease with stage 1 through stage 4 chronic kidney disease, or unspecified chronic kidney disease: Secondary | ICD-10-CM | POA: Diagnosis not present

## 2016-02-20 DIAGNOSIS — I679 Cerebrovascular disease, unspecified: Secondary | ICD-10-CM | POA: Diagnosis not present

## 2016-02-21 DIAGNOSIS — I2510 Atherosclerotic heart disease of native coronary artery without angina pectoris: Secondary | ICD-10-CM | POA: Diagnosis not present

## 2016-02-21 DIAGNOSIS — E441 Mild protein-calorie malnutrition: Secondary | ICD-10-CM | POA: Diagnosis not present

## 2016-02-21 DIAGNOSIS — Z8673 Personal history of transient ischemic attack (TIA), and cerebral infarction without residual deficits: Secondary | ICD-10-CM | POA: Diagnosis not present

## 2016-02-21 DIAGNOSIS — I679 Cerebrovascular disease, unspecified: Secondary | ICD-10-CM | POA: Diagnosis not present

## 2016-02-21 DIAGNOSIS — I129 Hypertensive chronic kidney disease with stage 1 through stage 4 chronic kidney disease, or unspecified chronic kidney disease: Secondary | ICD-10-CM | POA: Diagnosis not present

## 2016-02-21 DIAGNOSIS — L89323 Pressure ulcer of left buttock, stage 3: Secondary | ICD-10-CM | POA: Diagnosis not present

## 2016-02-21 DIAGNOSIS — F39 Unspecified mood [affective] disorder: Secondary | ICD-10-CM | POA: Diagnosis not present

## 2016-02-21 DIAGNOSIS — N400 Enlarged prostate without lower urinary tract symptoms: Secondary | ICD-10-CM | POA: Diagnosis not present

## 2016-02-21 DIAGNOSIS — K5190 Ulcerative colitis, unspecified, without complications: Secondary | ICD-10-CM | POA: Diagnosis not present

## 2016-02-21 DIAGNOSIS — N183 Chronic kidney disease, stage 3 (moderate): Secondary | ICD-10-CM | POA: Diagnosis not present

## 2016-02-21 DIAGNOSIS — R634 Abnormal weight loss: Secondary | ICD-10-CM | POA: Diagnosis not present

## 2016-02-21 DIAGNOSIS — F0150 Vascular dementia without behavioral disturbance: Secondary | ICD-10-CM | POA: Diagnosis not present

## 2016-02-21 DIAGNOSIS — F0390 Unspecified dementia without behavioral disturbance: Secondary | ICD-10-CM | POA: Diagnosis not present

## 2016-02-21 DIAGNOSIS — I951 Orthostatic hypotension: Secondary | ICD-10-CM | POA: Diagnosis not present

## 2016-02-22 DIAGNOSIS — I2510 Atherosclerotic heart disease of native coronary artery without angina pectoris: Secondary | ICD-10-CM | POA: Diagnosis not present

## 2016-02-22 DIAGNOSIS — R634 Abnormal weight loss: Secondary | ICD-10-CM | POA: Diagnosis not present

## 2016-02-22 DIAGNOSIS — I129 Hypertensive chronic kidney disease with stage 1 through stage 4 chronic kidney disease, or unspecified chronic kidney disease: Secondary | ICD-10-CM | POA: Diagnosis not present

## 2016-02-22 DIAGNOSIS — N183 Chronic kidney disease, stage 3 (moderate): Secondary | ICD-10-CM | POA: Diagnosis not present

## 2016-02-22 DIAGNOSIS — F0150 Vascular dementia without behavioral disturbance: Secondary | ICD-10-CM | POA: Diagnosis not present

## 2016-02-22 DIAGNOSIS — I679 Cerebrovascular disease, unspecified: Secondary | ICD-10-CM | POA: Diagnosis not present

## 2016-02-25 DIAGNOSIS — I679 Cerebrovascular disease, unspecified: Secondary | ICD-10-CM | POA: Diagnosis not present

## 2016-02-25 DIAGNOSIS — I129 Hypertensive chronic kidney disease with stage 1 through stage 4 chronic kidney disease, or unspecified chronic kidney disease: Secondary | ICD-10-CM | POA: Diagnosis not present

## 2016-02-25 DIAGNOSIS — F0150 Vascular dementia without behavioral disturbance: Secondary | ICD-10-CM | POA: Diagnosis not present

## 2016-02-25 DIAGNOSIS — R634 Abnormal weight loss: Secondary | ICD-10-CM | POA: Diagnosis not present

## 2016-02-25 DIAGNOSIS — I2510 Atherosclerotic heart disease of native coronary artery without angina pectoris: Secondary | ICD-10-CM | POA: Diagnosis not present

## 2016-02-25 DIAGNOSIS — N183 Chronic kidney disease, stage 3 (moderate): Secondary | ICD-10-CM | POA: Diagnosis not present

## 2016-02-26 DIAGNOSIS — I2510 Atherosclerotic heart disease of native coronary artery without angina pectoris: Secondary | ICD-10-CM | POA: Diagnosis not present

## 2016-02-26 DIAGNOSIS — N183 Chronic kidney disease, stage 3 (moderate): Secondary | ICD-10-CM | POA: Diagnosis not present

## 2016-02-26 DIAGNOSIS — I679 Cerebrovascular disease, unspecified: Secondary | ICD-10-CM | POA: Diagnosis not present

## 2016-02-26 DIAGNOSIS — R634 Abnormal weight loss: Secondary | ICD-10-CM | POA: Diagnosis not present

## 2016-02-26 DIAGNOSIS — F0150 Vascular dementia without behavioral disturbance: Secondary | ICD-10-CM | POA: Diagnosis not present

## 2016-02-26 DIAGNOSIS — I129 Hypertensive chronic kidney disease with stage 1 through stage 4 chronic kidney disease, or unspecified chronic kidney disease: Secondary | ICD-10-CM | POA: Diagnosis not present

## 2016-02-27 DIAGNOSIS — I679 Cerebrovascular disease, unspecified: Secondary | ICD-10-CM | POA: Diagnosis not present

## 2016-02-27 DIAGNOSIS — F0150 Vascular dementia without behavioral disturbance: Secondary | ICD-10-CM | POA: Diagnosis not present

## 2016-02-27 DIAGNOSIS — I2510 Atherosclerotic heart disease of native coronary artery without angina pectoris: Secondary | ICD-10-CM | POA: Diagnosis not present

## 2016-02-27 DIAGNOSIS — N183 Chronic kidney disease, stage 3 (moderate): Secondary | ICD-10-CM | POA: Diagnosis not present

## 2016-02-27 DIAGNOSIS — I129 Hypertensive chronic kidney disease with stage 1 through stage 4 chronic kidney disease, or unspecified chronic kidney disease: Secondary | ICD-10-CM | POA: Diagnosis not present

## 2016-02-27 DIAGNOSIS — R634 Abnormal weight loss: Secondary | ICD-10-CM | POA: Diagnosis not present

## 2016-02-28 DIAGNOSIS — F0390 Unspecified dementia without behavioral disturbance: Secondary | ICD-10-CM | POA: Diagnosis not present

## 2016-02-28 DIAGNOSIS — I679 Cerebrovascular disease, unspecified: Secondary | ICD-10-CM | POA: Diagnosis not present

## 2016-02-28 DIAGNOSIS — Z8673 Personal history of transient ischemic attack (TIA), and cerebral infarction without residual deficits: Secondary | ICD-10-CM | POA: Diagnosis not present

## 2016-02-28 DIAGNOSIS — N183 Chronic kidney disease, stage 3 (moderate): Secondary | ICD-10-CM | POA: Diagnosis not present

## 2016-02-28 DIAGNOSIS — R634 Abnormal weight loss: Secondary | ICD-10-CM | POA: Diagnosis not present

## 2016-02-28 DIAGNOSIS — F0150 Vascular dementia without behavioral disturbance: Secondary | ICD-10-CM | POA: Diagnosis not present

## 2016-02-28 DIAGNOSIS — L89323 Pressure ulcer of left buttock, stage 3: Secondary | ICD-10-CM | POA: Diagnosis not present

## 2016-02-28 DIAGNOSIS — I129 Hypertensive chronic kidney disease with stage 1 through stage 4 chronic kidney disease, or unspecified chronic kidney disease: Secondary | ICD-10-CM | POA: Diagnosis not present

## 2016-02-28 DIAGNOSIS — I2510 Atherosclerotic heart disease of native coronary artery without angina pectoris: Secondary | ICD-10-CM | POA: Diagnosis not present

## 2016-02-29 DIAGNOSIS — R634 Abnormal weight loss: Secondary | ICD-10-CM | POA: Diagnosis not present

## 2016-02-29 DIAGNOSIS — I679 Cerebrovascular disease, unspecified: Secondary | ICD-10-CM | POA: Diagnosis not present

## 2016-02-29 DIAGNOSIS — F0150 Vascular dementia without behavioral disturbance: Secondary | ICD-10-CM | POA: Diagnosis not present

## 2016-02-29 DIAGNOSIS — I129 Hypertensive chronic kidney disease with stage 1 through stage 4 chronic kidney disease, or unspecified chronic kidney disease: Secondary | ICD-10-CM | POA: Diagnosis not present

## 2016-02-29 DIAGNOSIS — I2510 Atherosclerotic heart disease of native coronary artery without angina pectoris: Secondary | ICD-10-CM | POA: Diagnosis not present

## 2016-02-29 DIAGNOSIS — N183 Chronic kidney disease, stage 3 (moderate): Secondary | ICD-10-CM | POA: Diagnosis not present

## 2016-03-03 DIAGNOSIS — I2510 Atherosclerotic heart disease of native coronary artery without angina pectoris: Secondary | ICD-10-CM | POA: Diagnosis not present

## 2016-03-03 DIAGNOSIS — N183 Chronic kidney disease, stage 3 (moderate): Secondary | ICD-10-CM | POA: Diagnosis not present

## 2016-03-03 DIAGNOSIS — F0150 Vascular dementia without behavioral disturbance: Secondary | ICD-10-CM | POA: Diagnosis not present

## 2016-03-03 DIAGNOSIS — R634 Abnormal weight loss: Secondary | ICD-10-CM | POA: Diagnosis not present

## 2016-03-03 DIAGNOSIS — I679 Cerebrovascular disease, unspecified: Secondary | ICD-10-CM | POA: Diagnosis not present

## 2016-03-03 DIAGNOSIS — I129 Hypertensive chronic kidney disease with stage 1 through stage 4 chronic kidney disease, or unspecified chronic kidney disease: Secondary | ICD-10-CM | POA: Diagnosis not present

## 2016-03-04 DIAGNOSIS — F0150 Vascular dementia without behavioral disturbance: Secondary | ICD-10-CM | POA: Diagnosis not present

## 2016-03-04 DIAGNOSIS — I679 Cerebrovascular disease, unspecified: Secondary | ICD-10-CM | POA: Diagnosis not present

## 2016-03-04 DIAGNOSIS — I2510 Atherosclerotic heart disease of native coronary artery without angina pectoris: Secondary | ICD-10-CM | POA: Diagnosis not present

## 2016-03-04 DIAGNOSIS — I129 Hypertensive chronic kidney disease with stage 1 through stage 4 chronic kidney disease, or unspecified chronic kidney disease: Secondary | ICD-10-CM | POA: Diagnosis not present

## 2016-03-04 DIAGNOSIS — R634 Abnormal weight loss: Secondary | ICD-10-CM | POA: Diagnosis not present

## 2016-03-04 DIAGNOSIS — N183 Chronic kidney disease, stage 3 (moderate): Secondary | ICD-10-CM | POA: Diagnosis not present

## 2016-03-05 DIAGNOSIS — I2510 Atherosclerotic heart disease of native coronary artery without angina pectoris: Secondary | ICD-10-CM | POA: Diagnosis not present

## 2016-03-05 DIAGNOSIS — R634 Abnormal weight loss: Secondary | ICD-10-CM | POA: Diagnosis not present

## 2016-03-05 DIAGNOSIS — N183 Chronic kidney disease, stage 3 (moderate): Secondary | ICD-10-CM | POA: Diagnosis not present

## 2016-03-05 DIAGNOSIS — I679 Cerebrovascular disease, unspecified: Secondary | ICD-10-CM | POA: Diagnosis not present

## 2016-03-05 DIAGNOSIS — F0150 Vascular dementia without behavioral disturbance: Secondary | ICD-10-CM | POA: Diagnosis not present

## 2016-03-05 DIAGNOSIS — I129 Hypertensive chronic kidney disease with stage 1 through stage 4 chronic kidney disease, or unspecified chronic kidney disease: Secondary | ICD-10-CM | POA: Diagnosis not present

## 2016-03-06 DIAGNOSIS — Z9049 Acquired absence of other specified parts of digestive tract: Secondary | ICD-10-CM | POA: Diagnosis not present

## 2016-03-06 DIAGNOSIS — F0390 Unspecified dementia without behavioral disturbance: Secondary | ICD-10-CM | POA: Diagnosis not present

## 2016-03-06 DIAGNOSIS — L89322 Pressure ulcer of left buttock, stage 2: Secondary | ICD-10-CM | POA: Diagnosis not present

## 2016-03-06 DIAGNOSIS — F0150 Vascular dementia without behavioral disturbance: Secondary | ICD-10-CM | POA: Diagnosis not present

## 2016-03-06 DIAGNOSIS — M19019 Primary osteoarthritis, unspecified shoulder: Secondary | ICD-10-CM | POA: Diagnosis not present

## 2016-03-06 DIAGNOSIS — K5790 Diverticulosis of intestine, part unspecified, without perforation or abscess without bleeding: Secondary | ICD-10-CM | POA: Diagnosis not present

## 2016-03-06 DIAGNOSIS — N183 Chronic kidney disease, stage 3 (moderate): Secondary | ICD-10-CM | POA: Diagnosis not present

## 2016-03-06 DIAGNOSIS — K5190 Ulcerative colitis, unspecified, without complications: Secondary | ICD-10-CM | POA: Diagnosis not present

## 2016-03-06 DIAGNOSIS — Z8673 Personal history of transient ischemic attack (TIA), and cerebral infarction without residual deficits: Secondary | ICD-10-CM | POA: Diagnosis not present

## 2016-03-06 DIAGNOSIS — I2510 Atherosclerotic heart disease of native coronary artery without angina pectoris: Secondary | ICD-10-CM | POA: Diagnosis not present

## 2016-03-06 DIAGNOSIS — I129 Hypertensive chronic kidney disease with stage 1 through stage 4 chronic kidney disease, or unspecified chronic kidney disease: Secondary | ICD-10-CM | POA: Diagnosis not present

## 2016-03-06 DIAGNOSIS — Z8551 Personal history of malignant neoplasm of bladder: Secondary | ICD-10-CM | POA: Diagnosis not present

## 2016-03-06 DIAGNOSIS — M109 Gout, unspecified: Secondary | ICD-10-CM | POA: Diagnosis not present

## 2016-03-06 DIAGNOSIS — E7800 Pure hypercholesterolemia, unspecified: Secondary | ICD-10-CM | POA: Diagnosis not present

## 2016-03-06 DIAGNOSIS — I679 Cerebrovascular disease, unspecified: Secondary | ICD-10-CM | POA: Diagnosis not present

## 2016-03-06 DIAGNOSIS — Z8546 Personal history of malignant neoplasm of prostate: Secondary | ICD-10-CM | POA: Diagnosis not present

## 2016-03-06 DIAGNOSIS — R634 Abnormal weight loss: Secondary | ICD-10-CM | POA: Diagnosis not present

## 2016-03-06 DIAGNOSIS — L89323 Pressure ulcer of left buttock, stage 3: Secondary | ICD-10-CM | POA: Diagnosis not present

## 2016-03-06 DIAGNOSIS — Z85038 Personal history of other malignant neoplasm of large intestine: Secondary | ICD-10-CM | POA: Diagnosis not present

## 2016-03-06 DIAGNOSIS — J189 Pneumonia, unspecified organism: Secondary | ICD-10-CM | POA: Diagnosis not present

## 2016-03-06 DIAGNOSIS — L89312 Pressure ulcer of right buttock, stage 2: Secondary | ICD-10-CM | POA: Diagnosis not present

## 2016-03-06 DIAGNOSIS — Z6822 Body mass index (BMI) 22.0-22.9, adult: Secondary | ICD-10-CM | POA: Diagnosis not present

## 2016-03-06 DIAGNOSIS — F419 Anxiety disorder, unspecified: Secondary | ICD-10-CM | POA: Diagnosis not present

## 2016-03-06 DIAGNOSIS — Z87891 Personal history of nicotine dependence: Secondary | ICD-10-CM | POA: Diagnosis not present

## 2016-03-06 DIAGNOSIS — K222 Esophageal obstruction: Secondary | ICD-10-CM | POA: Diagnosis not present

## 2016-03-06 DIAGNOSIS — Z951 Presence of aortocoronary bypass graft: Secondary | ICD-10-CM | POA: Diagnosis not present

## 2016-03-07 DIAGNOSIS — R634 Abnormal weight loss: Secondary | ICD-10-CM | POA: Diagnosis not present

## 2016-03-07 DIAGNOSIS — I2510 Atherosclerotic heart disease of native coronary artery without angina pectoris: Secondary | ICD-10-CM | POA: Diagnosis not present

## 2016-03-07 DIAGNOSIS — I129 Hypertensive chronic kidney disease with stage 1 through stage 4 chronic kidney disease, or unspecified chronic kidney disease: Secondary | ICD-10-CM | POA: Diagnosis not present

## 2016-03-07 DIAGNOSIS — N183 Chronic kidney disease, stage 3 (moderate): Secondary | ICD-10-CM | POA: Diagnosis not present

## 2016-03-07 DIAGNOSIS — F0150 Vascular dementia without behavioral disturbance: Secondary | ICD-10-CM | POA: Diagnosis not present

## 2016-03-07 DIAGNOSIS — I679 Cerebrovascular disease, unspecified: Secondary | ICD-10-CM | POA: Diagnosis not present

## 2016-03-10 DIAGNOSIS — R634 Abnormal weight loss: Secondary | ICD-10-CM | POA: Diagnosis not present

## 2016-03-10 DIAGNOSIS — I679 Cerebrovascular disease, unspecified: Secondary | ICD-10-CM | POA: Diagnosis not present

## 2016-03-10 DIAGNOSIS — F0150 Vascular dementia without behavioral disturbance: Secondary | ICD-10-CM | POA: Diagnosis not present

## 2016-03-10 DIAGNOSIS — I129 Hypertensive chronic kidney disease with stage 1 through stage 4 chronic kidney disease, or unspecified chronic kidney disease: Secondary | ICD-10-CM | POA: Diagnosis not present

## 2016-03-10 DIAGNOSIS — N183 Chronic kidney disease, stage 3 (moderate): Secondary | ICD-10-CM | POA: Diagnosis not present

## 2016-03-10 DIAGNOSIS — I2510 Atherosclerotic heart disease of native coronary artery without angina pectoris: Secondary | ICD-10-CM | POA: Diagnosis not present

## 2016-03-11 DIAGNOSIS — I129 Hypertensive chronic kidney disease with stage 1 through stage 4 chronic kidney disease, or unspecified chronic kidney disease: Secondary | ICD-10-CM | POA: Diagnosis not present

## 2016-03-11 DIAGNOSIS — R634 Abnormal weight loss: Secondary | ICD-10-CM | POA: Diagnosis not present

## 2016-03-11 DIAGNOSIS — F0150 Vascular dementia without behavioral disturbance: Secondary | ICD-10-CM | POA: Diagnosis not present

## 2016-03-11 DIAGNOSIS — N183 Chronic kidney disease, stage 3 (moderate): Secondary | ICD-10-CM | POA: Diagnosis not present

## 2016-03-11 DIAGNOSIS — I2510 Atherosclerotic heart disease of native coronary artery without angina pectoris: Secondary | ICD-10-CM | POA: Diagnosis not present

## 2016-03-11 DIAGNOSIS — I679 Cerebrovascular disease, unspecified: Secondary | ICD-10-CM | POA: Diagnosis not present

## 2016-03-12 DIAGNOSIS — I2510 Atherosclerotic heart disease of native coronary artery without angina pectoris: Secondary | ICD-10-CM | POA: Diagnosis not present

## 2016-03-12 DIAGNOSIS — R634 Abnormal weight loss: Secondary | ICD-10-CM | POA: Diagnosis not present

## 2016-03-12 DIAGNOSIS — I679 Cerebrovascular disease, unspecified: Secondary | ICD-10-CM | POA: Diagnosis not present

## 2016-03-12 DIAGNOSIS — I129 Hypertensive chronic kidney disease with stage 1 through stage 4 chronic kidney disease, or unspecified chronic kidney disease: Secondary | ICD-10-CM | POA: Diagnosis not present

## 2016-03-12 DIAGNOSIS — N183 Chronic kidney disease, stage 3 (moderate): Secondary | ICD-10-CM | POA: Diagnosis not present

## 2016-03-12 DIAGNOSIS — F0150 Vascular dementia without behavioral disturbance: Secondary | ICD-10-CM | POA: Diagnosis not present

## 2016-03-13 DIAGNOSIS — F0150 Vascular dementia without behavioral disturbance: Secondary | ICD-10-CM | POA: Diagnosis not present

## 2016-03-13 DIAGNOSIS — Z8673 Personal history of transient ischemic attack (TIA), and cerebral infarction without residual deficits: Secondary | ICD-10-CM | POA: Diagnosis not present

## 2016-03-13 DIAGNOSIS — I679 Cerebrovascular disease, unspecified: Secondary | ICD-10-CM | POA: Diagnosis not present

## 2016-03-13 DIAGNOSIS — F0390 Unspecified dementia without behavioral disturbance: Secondary | ICD-10-CM | POA: Diagnosis not present

## 2016-03-13 DIAGNOSIS — L89323 Pressure ulcer of left buttock, stage 3: Secondary | ICD-10-CM | POA: Diagnosis not present

## 2016-03-13 DIAGNOSIS — I129 Hypertensive chronic kidney disease with stage 1 through stage 4 chronic kidney disease, or unspecified chronic kidney disease: Secondary | ICD-10-CM | POA: Diagnosis not present

## 2016-03-13 DIAGNOSIS — R634 Abnormal weight loss: Secondary | ICD-10-CM | POA: Diagnosis not present

## 2016-03-13 DIAGNOSIS — N183 Chronic kidney disease, stage 3 (moderate): Secondary | ICD-10-CM | POA: Diagnosis not present

## 2016-03-13 DIAGNOSIS — I2510 Atherosclerotic heart disease of native coronary artery without angina pectoris: Secondary | ICD-10-CM | POA: Diagnosis not present

## 2016-03-14 DIAGNOSIS — N183 Chronic kidney disease, stage 3 (moderate): Secondary | ICD-10-CM | POA: Diagnosis not present

## 2016-03-14 DIAGNOSIS — I129 Hypertensive chronic kidney disease with stage 1 through stage 4 chronic kidney disease, or unspecified chronic kidney disease: Secondary | ICD-10-CM | POA: Diagnosis not present

## 2016-03-14 DIAGNOSIS — I679 Cerebrovascular disease, unspecified: Secondary | ICD-10-CM | POA: Diagnosis not present

## 2016-03-14 DIAGNOSIS — F0150 Vascular dementia without behavioral disturbance: Secondary | ICD-10-CM | POA: Diagnosis not present

## 2016-03-14 DIAGNOSIS — I2510 Atherosclerotic heart disease of native coronary artery without angina pectoris: Secondary | ICD-10-CM | POA: Diagnosis not present

## 2016-03-14 DIAGNOSIS — R634 Abnormal weight loss: Secondary | ICD-10-CM | POA: Diagnosis not present

## 2016-03-17 DIAGNOSIS — I679 Cerebrovascular disease, unspecified: Secondary | ICD-10-CM | POA: Diagnosis not present

## 2016-03-17 DIAGNOSIS — F0150 Vascular dementia without behavioral disturbance: Secondary | ICD-10-CM | POA: Diagnosis not present

## 2016-03-17 DIAGNOSIS — I2510 Atherosclerotic heart disease of native coronary artery without angina pectoris: Secondary | ICD-10-CM | POA: Diagnosis not present

## 2016-03-17 DIAGNOSIS — N183 Chronic kidney disease, stage 3 (moderate): Secondary | ICD-10-CM | POA: Diagnosis not present

## 2016-03-17 DIAGNOSIS — I129 Hypertensive chronic kidney disease with stage 1 through stage 4 chronic kidney disease, or unspecified chronic kidney disease: Secondary | ICD-10-CM | POA: Diagnosis not present

## 2016-03-17 DIAGNOSIS — R634 Abnormal weight loss: Secondary | ICD-10-CM | POA: Diagnosis not present

## 2016-03-18 DIAGNOSIS — N183 Chronic kidney disease, stage 3 (moderate): Secondary | ICD-10-CM | POA: Diagnosis not present

## 2016-03-18 DIAGNOSIS — I2510 Atherosclerotic heart disease of native coronary artery without angina pectoris: Secondary | ICD-10-CM | POA: Diagnosis not present

## 2016-03-18 DIAGNOSIS — R634 Abnormal weight loss: Secondary | ICD-10-CM | POA: Diagnosis not present

## 2016-03-18 DIAGNOSIS — F0150 Vascular dementia without behavioral disturbance: Secondary | ICD-10-CM | POA: Diagnosis not present

## 2016-03-18 DIAGNOSIS — I129 Hypertensive chronic kidney disease with stage 1 through stage 4 chronic kidney disease, or unspecified chronic kidney disease: Secondary | ICD-10-CM | POA: Diagnosis not present

## 2016-03-18 DIAGNOSIS — I679 Cerebrovascular disease, unspecified: Secondary | ICD-10-CM | POA: Diagnosis not present

## 2016-03-19 DIAGNOSIS — I129 Hypertensive chronic kidney disease with stage 1 through stage 4 chronic kidney disease, or unspecified chronic kidney disease: Secondary | ICD-10-CM | POA: Diagnosis not present

## 2016-03-19 DIAGNOSIS — F0150 Vascular dementia without behavioral disturbance: Secondary | ICD-10-CM | POA: Diagnosis not present

## 2016-03-19 DIAGNOSIS — I679 Cerebrovascular disease, unspecified: Secondary | ICD-10-CM | POA: Diagnosis not present

## 2016-03-19 DIAGNOSIS — I2510 Atherosclerotic heart disease of native coronary artery without angina pectoris: Secondary | ICD-10-CM | POA: Diagnosis not present

## 2016-03-19 DIAGNOSIS — N183 Chronic kidney disease, stage 3 (moderate): Secondary | ICD-10-CM | POA: Diagnosis not present

## 2016-03-19 DIAGNOSIS — R634 Abnormal weight loss: Secondary | ICD-10-CM | POA: Diagnosis not present

## 2016-03-20 DIAGNOSIS — I679 Cerebrovascular disease, unspecified: Secondary | ICD-10-CM | POA: Diagnosis not present

## 2016-03-20 DIAGNOSIS — Z8673 Personal history of transient ischemic attack (TIA), and cerebral infarction without residual deficits: Secondary | ICD-10-CM | POA: Diagnosis not present

## 2016-03-20 DIAGNOSIS — Z872 Personal history of diseases of the skin and subcutaneous tissue: Secondary | ICD-10-CM | POA: Diagnosis not present

## 2016-03-20 DIAGNOSIS — N183 Chronic kidney disease, stage 3 (moderate): Secondary | ICD-10-CM | POA: Diagnosis not present

## 2016-03-20 DIAGNOSIS — F0390 Unspecified dementia without behavioral disturbance: Secondary | ICD-10-CM | POA: Diagnosis not present

## 2016-03-20 DIAGNOSIS — R634 Abnormal weight loss: Secondary | ICD-10-CM | POA: Diagnosis not present

## 2016-03-20 DIAGNOSIS — F0150 Vascular dementia without behavioral disturbance: Secondary | ICD-10-CM | POA: Diagnosis not present

## 2016-03-20 DIAGNOSIS — I2510 Atherosclerotic heart disease of native coronary artery without angina pectoris: Secondary | ICD-10-CM | POA: Diagnosis not present

## 2016-03-20 DIAGNOSIS — Z09 Encounter for follow-up examination after completed treatment for conditions other than malignant neoplasm: Secondary | ICD-10-CM | POA: Diagnosis not present

## 2016-03-20 DIAGNOSIS — I129 Hypertensive chronic kidney disease with stage 1 through stage 4 chronic kidney disease, or unspecified chronic kidney disease: Secondary | ICD-10-CM | POA: Diagnosis not present

## 2016-03-20 DIAGNOSIS — L89323 Pressure ulcer of left buttock, stage 3: Secondary | ICD-10-CM | POA: Diagnosis not present

## 2016-03-21 DIAGNOSIS — R634 Abnormal weight loss: Secondary | ICD-10-CM | POA: Diagnosis not present

## 2016-03-21 DIAGNOSIS — I679 Cerebrovascular disease, unspecified: Secondary | ICD-10-CM | POA: Diagnosis not present

## 2016-03-21 DIAGNOSIS — N183 Chronic kidney disease, stage 3 (moderate): Secondary | ICD-10-CM | POA: Diagnosis not present

## 2016-03-21 DIAGNOSIS — F0150 Vascular dementia without behavioral disturbance: Secondary | ICD-10-CM | POA: Diagnosis not present

## 2016-03-21 DIAGNOSIS — I2510 Atherosclerotic heart disease of native coronary artery without angina pectoris: Secondary | ICD-10-CM | POA: Diagnosis not present

## 2016-03-21 DIAGNOSIS — I129 Hypertensive chronic kidney disease with stage 1 through stage 4 chronic kidney disease, or unspecified chronic kidney disease: Secondary | ICD-10-CM | POA: Diagnosis not present

## 2016-03-24 ENCOUNTER — Telehealth: Payer: Self-pay

## 2016-03-24 DIAGNOSIS — N183 Chronic kidney disease, stage 3 (moderate): Secondary | ICD-10-CM | POA: Diagnosis not present

## 2016-03-24 DIAGNOSIS — I129 Hypertensive chronic kidney disease with stage 1 through stage 4 chronic kidney disease, or unspecified chronic kidney disease: Secondary | ICD-10-CM | POA: Diagnosis not present

## 2016-03-24 DIAGNOSIS — F0150 Vascular dementia without behavioral disturbance: Secondary | ICD-10-CM | POA: Diagnosis not present

## 2016-03-24 DIAGNOSIS — R451 Restlessness and agitation: Secondary | ICD-10-CM | POA: Diagnosis not present

## 2016-03-24 DIAGNOSIS — I2510 Atherosclerotic heart disease of native coronary artery without angina pectoris: Secondary | ICD-10-CM | POA: Diagnosis not present

## 2016-03-24 DIAGNOSIS — R634 Abnormal weight loss: Secondary | ICD-10-CM | POA: Diagnosis not present

## 2016-03-24 DIAGNOSIS — I679 Cerebrovascular disease, unspecified: Secondary | ICD-10-CM | POA: Diagnosis not present

## 2016-03-24 NOTE — Telephone Encounter (Signed)
[place] [facility] [place] begun prn, increased citalopram (lorazepam stopped as it seemed to make him more agitated)

## 2016-03-24 NOTE — Telephone Encounter (Signed)
PLEASE NOTE: All timestamps contained within this report are represented as [place] Standard Time. CONFIDENTIALTY NOTICE: This fax transmission is intended only for the addressee. It contains information that is legally privileged, confidential or otherwise protected from use or disclosure. If you are not the intended recipient, you are strictly prohibited from reviewing, disclosing, copying using or disseminating any of this information or taking any action in reliance on or regarding this information. If you have received this fax in error, please notify us immediately by telephone so that we can arrange for its return to [place]. Phone: [phone], Toll-Free: [phone], Fax: [phone] Page: 1 of 1 Call Id: [id number] [place] Night - Client Nonclinical Telephone Record [place] Night - Client Client Site [place] Physician Viviana Simpler - MD Contact Type Call Call [place] Page Now Who Is [place] / [place] Name [place] Name [facility] Number [phone] Patient Name Dustin Byrd Patient DOB Dec 02, 1918 Reason for Call Request to speak to Physician Initial Comment Martie Round at [facility] regarding a hospice pt that has dementia. He has been restless all weekend. He has been give PRN Adivan and standing doses of Adivan. He has continuous pain and is needing added pain medication. Additional Comment Paging DoctorName Phone DateTime Result/Outcome Message Type Notes Howard Pouch [phone] 03/24/2016 2:21:43 AM Called On Call Provider - Reached Doctor Paged Howard Pouch 03/24/2016 2:21:46 AM Spoke with On Call - General Message Result Call Closed By: Vicente Males Transaction Date/Time: 03/24/2016 2:13:58 AM (ET)

## 2016-03-25 ENCOUNTER — Telehealth: Payer: Self-pay

## 2016-03-25 DIAGNOSIS — R634 Abnormal weight loss: Secondary | ICD-10-CM | POA: Diagnosis not present

## 2016-03-25 DIAGNOSIS — N183 Chronic kidney disease, stage 3 (moderate): Secondary | ICD-10-CM | POA: Diagnosis not present

## 2016-03-25 DIAGNOSIS — I679 Cerebrovascular disease, unspecified: Secondary | ICD-10-CM | POA: Diagnosis not present

## 2016-03-25 DIAGNOSIS — F0150 Vascular dementia without behavioral disturbance: Secondary | ICD-10-CM | POA: Diagnosis not present

## 2016-03-25 DIAGNOSIS — I2510 Atherosclerotic heart disease of native coronary artery without angina pectoris: Secondary | ICD-10-CM | POA: Diagnosis not present

## 2016-03-25 DIAGNOSIS — I129 Hypertensive chronic kidney disease with stage 1 through stage 4 chronic kidney disease, or unspecified chronic kidney disease: Secondary | ICD-10-CM | POA: Diagnosis not present

## 2016-03-25 NOTE — Telephone Encounter (Signed)
Jennie nurse with [facility] left v/m; Patrici Ranks was nurse on call 03/20/16; Patrici Ranks got call from [facility] wanting to know if had med for pt due to ativan not helping agitation; Patrici Ranks told them she had Haldol on symptom mgt and pain sheet. Patrici Ranks gave order to give pt one Haldol 1 mg based on standing orders; Patrici Ranks said she did not follow thru to make sure that order was deleted by Dr Silvio Pate and Dr Silvio Pate had deleted that order.Patrici Ranks said there was a med error that Patrici Ranks made because the Haldol 1 mg had been scratched off symptom mgt protocol. Jennie request note to go to Dr Silvio Pate.

## 2016-03-25 NOTE — Telephone Encounter (Signed)
Left message on VM for Jennie. 

## 2016-03-25 NOTE — Telephone Encounter (Signed)
Please let her know that that is okay--we needed to try something (ie--a one time dose). I have since stopped the lorazepam, increased his citalopram and have roxanol available

## 2016-03-26 DIAGNOSIS — R634 Abnormal weight loss: Secondary | ICD-10-CM | POA: Diagnosis not present

## 2016-03-26 DIAGNOSIS — I2510 Atherosclerotic heart disease of native coronary artery without angina pectoris: Secondary | ICD-10-CM | POA: Diagnosis not present

## 2016-03-26 DIAGNOSIS — I129 Hypertensive chronic kidney disease with stage 1 through stage 4 chronic kidney disease, or unspecified chronic kidney disease: Secondary | ICD-10-CM | POA: Diagnosis not present

## 2016-03-26 DIAGNOSIS — I679 Cerebrovascular disease, unspecified: Secondary | ICD-10-CM | POA: Diagnosis not present

## 2016-03-26 DIAGNOSIS — F0150 Vascular dementia without behavioral disturbance: Secondary | ICD-10-CM | POA: Diagnosis not present

## 2016-03-26 DIAGNOSIS — N183 Chronic kidney disease, stage 3 (moderate): Secondary | ICD-10-CM | POA: Diagnosis not present

## 2016-03-27 DIAGNOSIS — N183 Chronic kidney disease, stage 3 (moderate): Secondary | ICD-10-CM | POA: Diagnosis not present

## 2016-03-27 DIAGNOSIS — I679 Cerebrovascular disease, unspecified: Secondary | ICD-10-CM | POA: Diagnosis not present

## 2016-03-27 DIAGNOSIS — R634 Abnormal weight loss: Secondary | ICD-10-CM | POA: Diagnosis not present

## 2016-03-27 DIAGNOSIS — F0150 Vascular dementia without behavioral disturbance: Secondary | ICD-10-CM | POA: Diagnosis not present

## 2016-03-27 DIAGNOSIS — I129 Hypertensive chronic kidney disease with stage 1 through stage 4 chronic kidney disease, or unspecified chronic kidney disease: Secondary | ICD-10-CM | POA: Diagnosis not present

## 2016-03-27 DIAGNOSIS — I2510 Atherosclerotic heart disease of native coronary artery without angina pectoris: Secondary | ICD-10-CM | POA: Diagnosis not present

## 2016-03-28 DIAGNOSIS — I2510 Atherosclerotic heart disease of native coronary artery without angina pectoris: Secondary | ICD-10-CM | POA: Diagnosis not present

## 2016-03-28 DIAGNOSIS — I679 Cerebrovascular disease, unspecified: Secondary | ICD-10-CM | POA: Diagnosis not present

## 2016-03-28 DIAGNOSIS — F0150 Vascular dementia without behavioral disturbance: Secondary | ICD-10-CM | POA: Diagnosis not present

## 2016-03-28 DIAGNOSIS — N183 Chronic kidney disease, stage 3 (moderate): Secondary | ICD-10-CM | POA: Diagnosis not present

## 2016-03-28 DIAGNOSIS — I129 Hypertensive chronic kidney disease with stage 1 through stage 4 chronic kidney disease, or unspecified chronic kidney disease: Secondary | ICD-10-CM | POA: Diagnosis not present

## 2016-03-28 DIAGNOSIS — R634 Abnormal weight loss: Secondary | ICD-10-CM | POA: Diagnosis not present

## 2016-03-29 DIAGNOSIS — N183 Chronic kidney disease, stage 3 (moderate): Secondary | ICD-10-CM | POA: Diagnosis not present

## 2016-03-29 DIAGNOSIS — I679 Cerebrovascular disease, unspecified: Secondary | ICD-10-CM | POA: Diagnosis not present

## 2016-03-29 DIAGNOSIS — F0150 Vascular dementia without behavioral disturbance: Secondary | ICD-10-CM | POA: Diagnosis not present

## 2016-03-29 DIAGNOSIS — I129 Hypertensive chronic kidney disease with stage 1 through stage 4 chronic kidney disease, or unspecified chronic kidney disease: Secondary | ICD-10-CM | POA: Diagnosis not present

## 2016-03-29 DIAGNOSIS — I2510 Atherosclerotic heart disease of native coronary artery without angina pectoris: Secondary | ICD-10-CM | POA: Diagnosis not present

## 2016-03-29 DIAGNOSIS — R634 Abnormal weight loss: Secondary | ICD-10-CM | POA: Diagnosis not present

## 2016-03-30 DIAGNOSIS — I129 Hypertensive chronic kidney disease with stage 1 through stage 4 chronic kidney disease, or unspecified chronic kidney disease: Secondary | ICD-10-CM | POA: Diagnosis not present

## 2016-03-30 DIAGNOSIS — N183 Chronic kidney disease, stage 3 (moderate): Secondary | ICD-10-CM | POA: Diagnosis not present

## 2016-03-30 DIAGNOSIS — F0150 Vascular dementia without behavioral disturbance: Secondary | ICD-10-CM | POA: Diagnosis not present

## 2016-03-30 DIAGNOSIS — I679 Cerebrovascular disease, unspecified: Secondary | ICD-10-CM | POA: Diagnosis not present

## 2016-03-30 DIAGNOSIS — R634 Abnormal weight loss: Secondary | ICD-10-CM | POA: Diagnosis not present

## 2016-03-30 DIAGNOSIS — I2510 Atherosclerotic heart disease of native coronary artery without angina pectoris: Secondary | ICD-10-CM | POA: Diagnosis not present

## 2016-03-31 DIAGNOSIS — N183 Chronic kidney disease, stage 3 (moderate): Secondary | ICD-10-CM | POA: Diagnosis not present

## 2016-03-31 DIAGNOSIS — I129 Hypertensive chronic kidney disease with stage 1 through stage 4 chronic kidney disease, or unspecified chronic kidney disease: Secondary | ICD-10-CM | POA: Diagnosis not present

## 2016-03-31 DIAGNOSIS — R634 Abnormal weight loss: Secondary | ICD-10-CM | POA: Diagnosis not present

## 2016-03-31 DIAGNOSIS — I679 Cerebrovascular disease, unspecified: Secondary | ICD-10-CM | POA: Diagnosis not present

## 2016-03-31 DIAGNOSIS — I2510 Atherosclerotic heart disease of native coronary artery without angina pectoris: Secondary | ICD-10-CM | POA: Diagnosis not present

## 2016-03-31 DIAGNOSIS — F0150 Vascular dementia without behavioral disturbance: Secondary | ICD-10-CM | POA: Diagnosis not present

## 2016-04-01 DIAGNOSIS — I679 Cerebrovascular disease, unspecified: Secondary | ICD-10-CM | POA: Diagnosis not present

## 2016-04-01 DIAGNOSIS — F0150 Vascular dementia without behavioral disturbance: Secondary | ICD-10-CM | POA: Diagnosis not present

## 2016-04-01 DIAGNOSIS — I2510 Atherosclerotic heart disease of native coronary artery without angina pectoris: Secondary | ICD-10-CM | POA: Diagnosis not present

## 2016-04-01 DIAGNOSIS — R634 Abnormal weight loss: Secondary | ICD-10-CM | POA: Diagnosis not present

## 2016-04-01 DIAGNOSIS — N183 Chronic kidney disease, stage 3 (moderate): Secondary | ICD-10-CM | POA: Diagnosis not present

## 2016-04-01 DIAGNOSIS — I129 Hypertensive chronic kidney disease with stage 1 through stage 4 chronic kidney disease, or unspecified chronic kidney disease: Secondary | ICD-10-CM | POA: Diagnosis not present

## 2016-04-06 DEATH — deceased

## 2016-04-12 IMAGING — CR DG HIP (WITH OR WITHOUT PELVIS) 2-3V*R*
1 series · 3 of 3 positions shown · non-contrast
Comparison: None.

CLINICAL DATA: Right hip pain, fall

EXAM:
DG HIP (WITH OR WITHOUT PELVIS) 2-3V RIGHT

[Series 2: t hip ap right · 0.14mm/px · 3 of 3 slices shown]
[im 1/3]
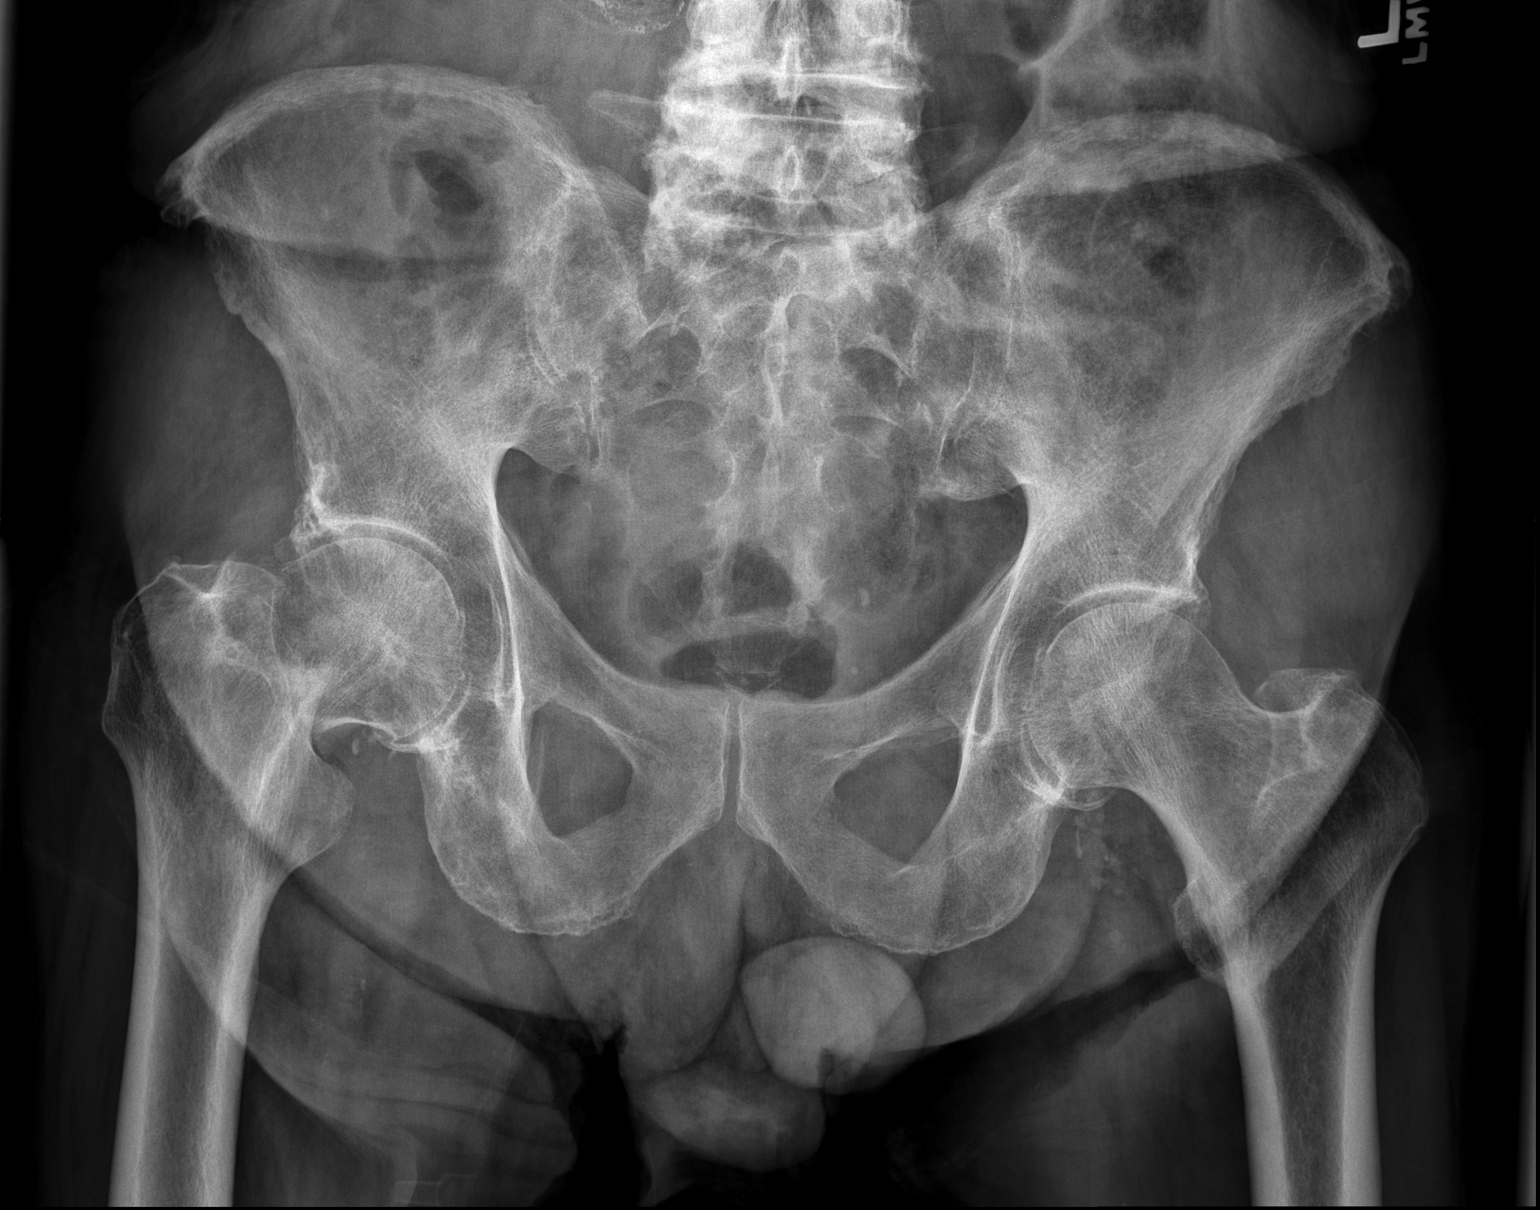
[im 2/3]
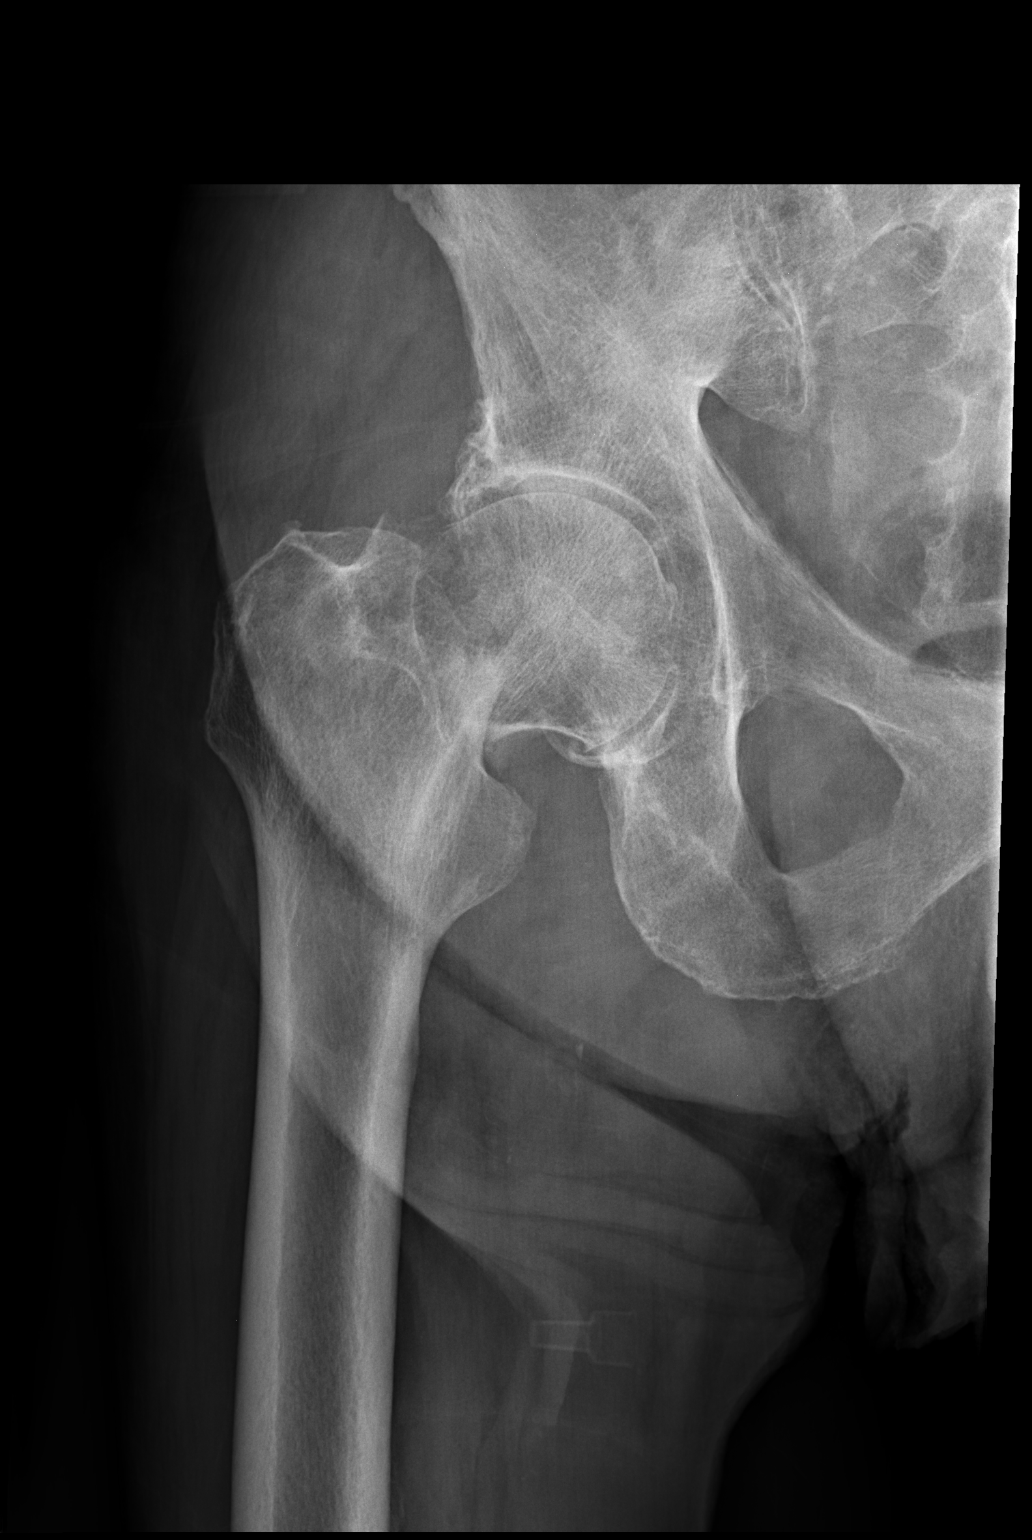
[im 3/3]
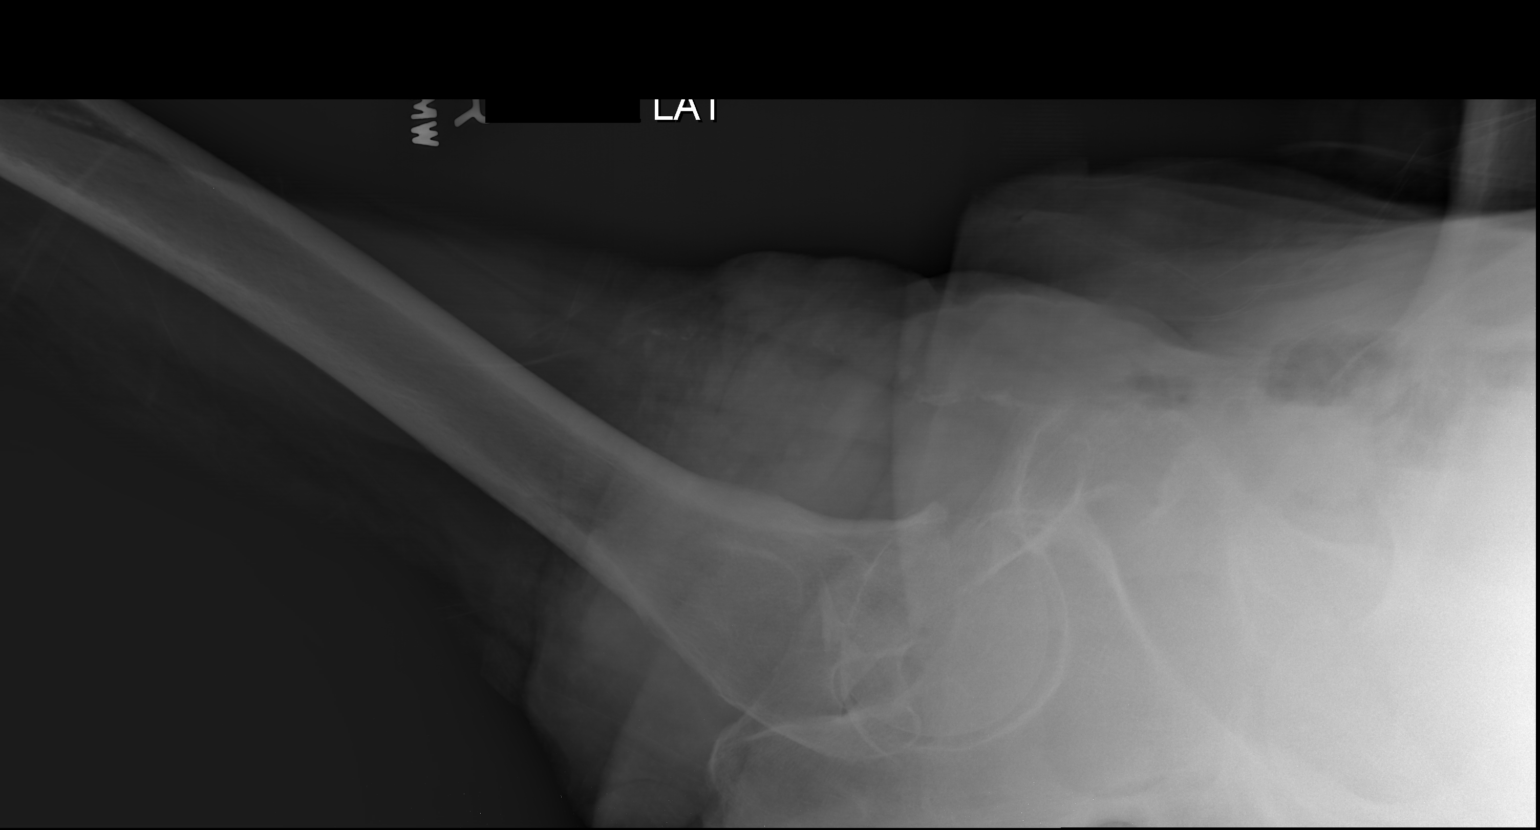

[3 of 3 positions shown; findings below may reference images not displayed]

FINDINGS: There is a mildly impacted right femoral neck fracture. The femoral
head is appropriately located. Normal visualized bowel gas pattern.
Vascular calcifications are noted.
IMPRESSION: Mildly impacted acute right femoral neck fracture.

## 2016-04-15 IMAGING — CR DG CHEST 2V
1 series · 2 of 2 positions shown · non-contrast
Comparison: 09/04/2014 chest radiograph

CLINICAL DATA: Fever status post recent right hip hemiarthroplasty.

EXAM:
CHEST  2 VIEW

[Series 1: dg chest 2 view · 0.14mm/px · 2 of 2 slices shown]
[im 1/2]
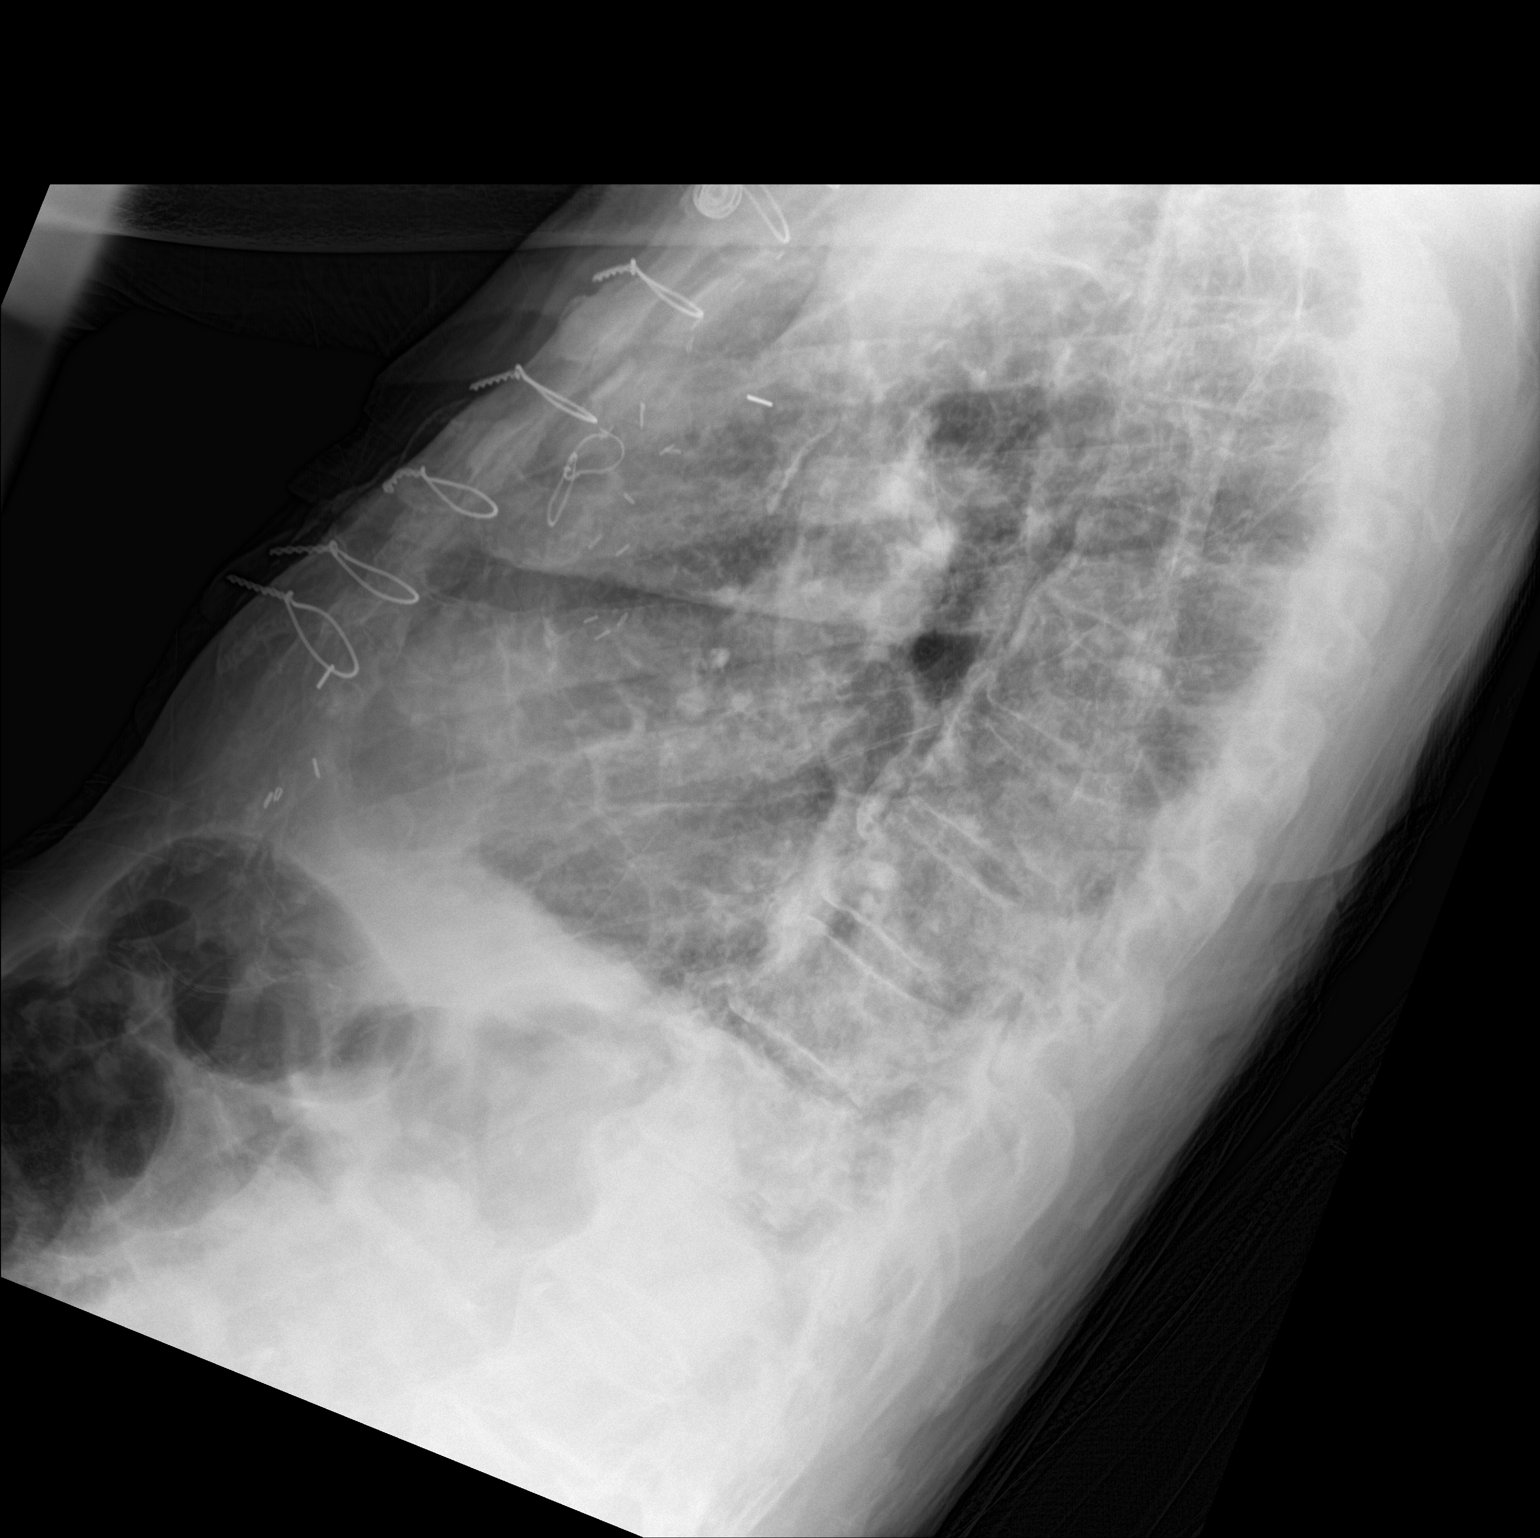
[im 2/2]
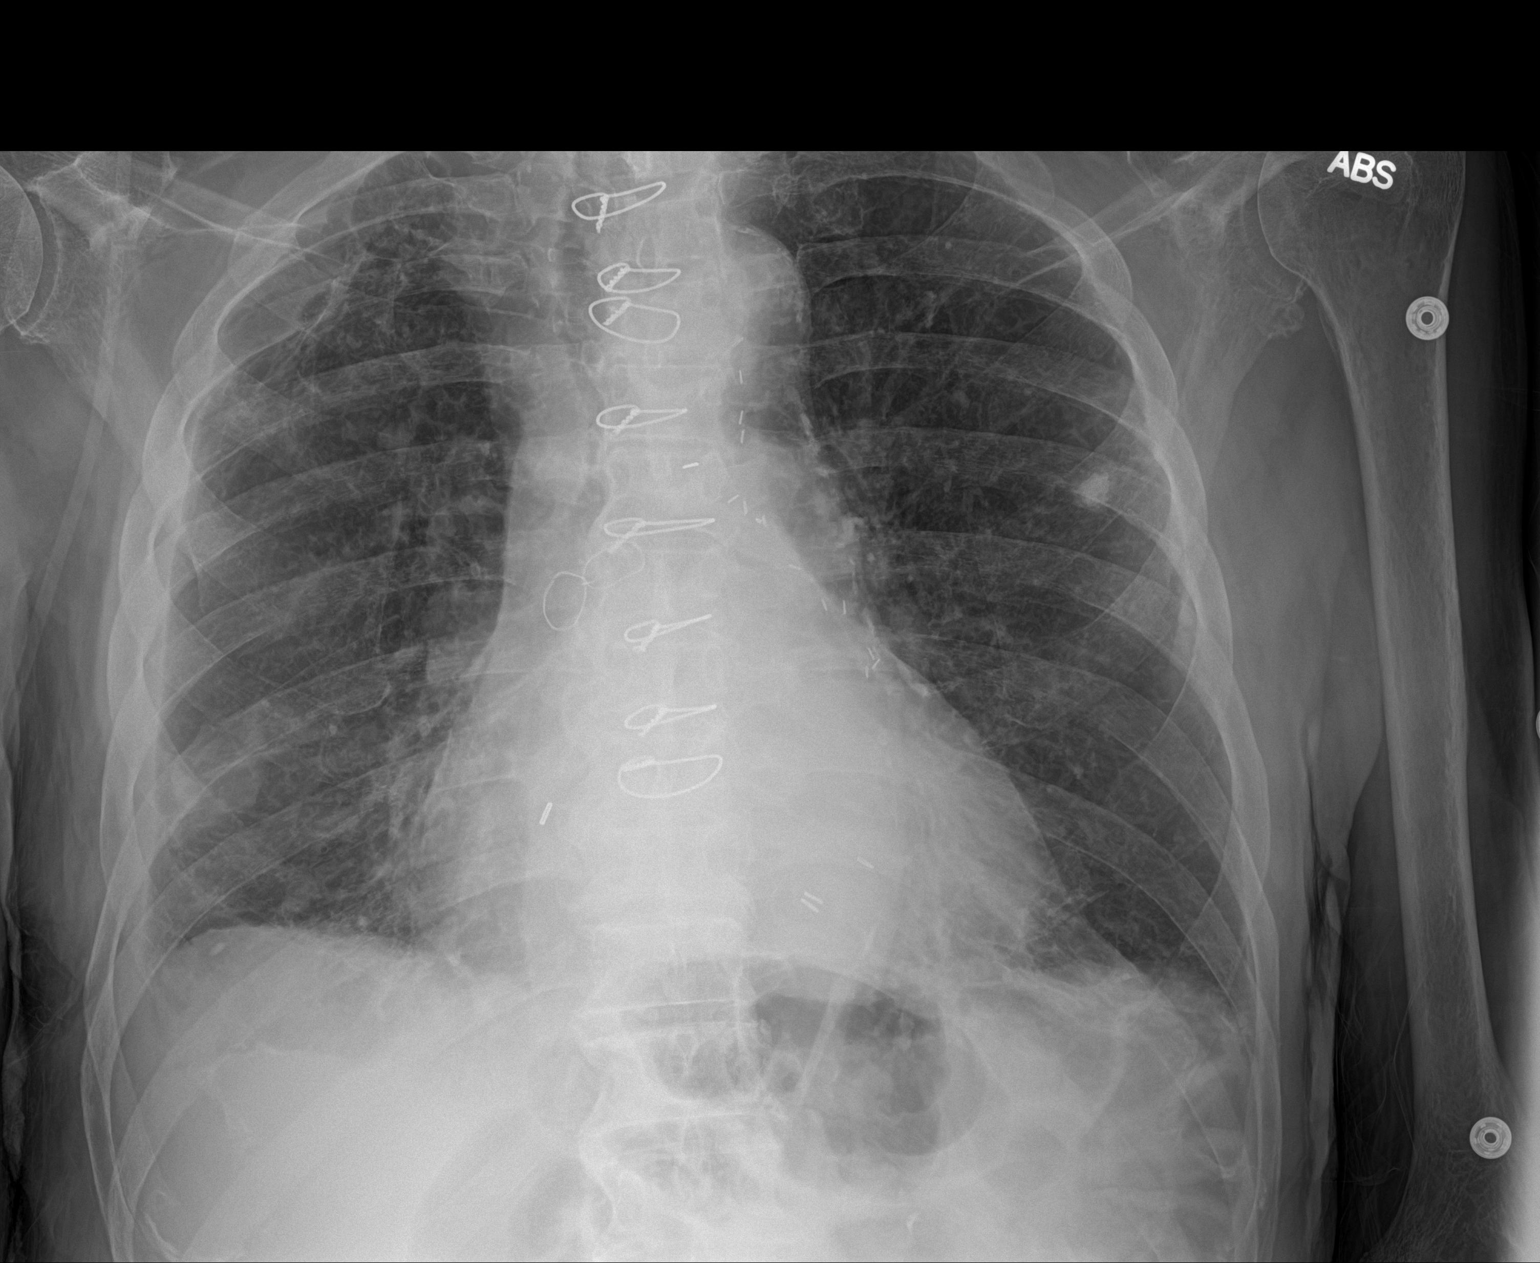

[2 of 2 positions shown; findings below may reference images not displayed]

FINDINGS: Median sternotomy wires are aligned and intact. CABG clips are noted
throughout the mediastinum. Stable cardiomediastinal silhouette with
mild cardiomegaly and mildly tortuous atherosclerotic thoracic
aorta. No pneumothorax. There is stable pleural thickening at the
lateral basilar right pleural space, which is a stable chronic
finding back to the 8213. No evidence of an acute pleural effusion.
Stable calcified granuloma in the left mid lung. Stable
pleural-parenchymal scarring at the right lung apex. There is patchy
lower lung opacity overlying the lower thoracic spine on the lateral
radiograph. No pulmonary edema. Moderate degenerative changes are
present in the thoracic spine.
IMPRESSION: 1. Patchy lower lobe opacity on the lateral view, which may
represent a lower lobe pneumonia. Advise posttreatment follow-up PA
and lateral chest radiographs to document resolution.
2. Stable chronic lateral basilar right pleural thickening.

## 2016-05-06 DEATH — deceased
